# Patient Record
Sex: Female | Born: 1955 | Race: White | Hispanic: No | Marital: Married | Smoking: Never smoker
Health system: Southern US, Community
[De-identification: ages and names within clinical notes are randomized; demographics above are authoritative.]

## PROBLEM LIST (undated history)

## (undated) DIAGNOSIS — F329 Major depressive disorder, single episode, unspecified: Secondary | ICD-10-CM

## (undated) DIAGNOSIS — B001 Herpesviral vesicular dermatitis: Secondary | ICD-10-CM

## (undated) DIAGNOSIS — F32A Depression, unspecified: Secondary | ICD-10-CM

## (undated) DIAGNOSIS — J45909 Unspecified asthma, uncomplicated: Secondary | ICD-10-CM

## (undated) DIAGNOSIS — M1712 Unilateral primary osteoarthritis, left knee: Secondary | ICD-10-CM

## (undated) DIAGNOSIS — R112 Nausea with vomiting, unspecified: Secondary | ICD-10-CM

## (undated) DIAGNOSIS — Z8739 Personal history of other diseases of the musculoskeletal system and connective tissue: Secondary | ICD-10-CM

## (undated) DIAGNOSIS — G250 Essential tremor: Secondary | ICD-10-CM

## (undated) DIAGNOSIS — F411 Generalized anxiety disorder: Secondary | ICD-10-CM

## (undated) DIAGNOSIS — M797 Fibromyalgia: Secondary | ICD-10-CM

## (undated) DIAGNOSIS — E079 Disorder of thyroid, unspecified: Secondary | ICD-10-CM

## (undated) DIAGNOSIS — R42 Dizziness and giddiness: Secondary | ICD-10-CM

## (undated) DIAGNOSIS — Z9889 Other specified postprocedural states: Secondary | ICD-10-CM

## (undated) DIAGNOSIS — E785 Hyperlipidemia, unspecified: Secondary | ICD-10-CM

## (undated) DIAGNOSIS — F419 Anxiety disorder, unspecified: Secondary | ICD-10-CM

## (undated) DIAGNOSIS — R51 Headache: Secondary | ICD-10-CM

## (undated) DIAGNOSIS — Z803 Family history of malignant neoplasm of breast: Secondary | ICD-10-CM

## (undated) DIAGNOSIS — H9319 Tinnitus, unspecified ear: Secondary | ICD-10-CM

## (undated) DIAGNOSIS — N189 Chronic kidney disease, unspecified: Secondary | ICD-10-CM

## (undated) HISTORY — DX: Unilateral primary osteoarthritis, left knee: M17.12

## (undated) HISTORY — DX: Dizziness and giddiness: R42

## (undated) HISTORY — DX: Anxiety disorder, unspecified: F41.9

## (undated) HISTORY — DX: Fibromyalgia: M79.7

## (undated) HISTORY — PX: KNEE ARTHROSCOPY: SUR90

## (undated) HISTORY — DX: Family history of malignant neoplasm of breast: Z80.3

## (undated) HISTORY — DX: Essential tremor: G25.0

## (undated) HISTORY — DX: Personal history of other diseases of the musculoskeletal system and connective tissue: Z87.39

## (undated) HISTORY — DX: Disorder of thyroid, unspecified: E07.9

## (undated) HISTORY — DX: Depression, unspecified: F32.A

## (undated) HISTORY — DX: Chronic kidney disease, unspecified: N18.9

## (undated) HISTORY — DX: Herpesviral vesicular dermatitis: B00.1

## (undated) HISTORY — PX: CHOLECYSTECTOMY: SHX55

## (undated) HISTORY — DX: Hyperlipidemia, unspecified: E78.5

## (undated) HISTORY — PX: ABDOMINAL HYSTERECTOMY: SHX81

## (undated) HISTORY — DX: Generalized anxiety disorder: F41.1

## (undated) HISTORY — PX: OTHER SURGICAL HISTORY: SHX169

## (undated) HISTORY — DX: Headache: R51

## (undated) HISTORY — PX: APPENDECTOMY: SHX54

## (undated) HISTORY — DX: Major depressive disorder, single episode, unspecified: F32.9

---

## 1991-02-04 HISTORY — PX: BREAST SURGERY: SHX581

## 1997-11-06 ENCOUNTER — Emergency Department (HOSPITAL_COMMUNITY): Admission: EM | Admit: 1997-11-06 | Discharge: 1997-11-06 | Payer: Self-pay | Admitting: Emergency Medicine

## 1998-10-11 ENCOUNTER — Emergency Department (HOSPITAL_COMMUNITY): Admission: EM | Admit: 1998-10-11 | Discharge: 1998-10-11 | Payer: Self-pay | Admitting: Emergency Medicine

## 1998-10-11 ENCOUNTER — Encounter: Payer: Self-pay | Admitting: Emergency Medicine

## 2001-01-03 ENCOUNTER — Inpatient Hospital Stay (HOSPITAL_COMMUNITY): Admission: EM | Admit: 2001-01-03 | Discharge: 2001-01-06 | Payer: Self-pay | Admitting: Emergency Medicine

## 2001-01-04 ENCOUNTER — Encounter: Payer: Self-pay | Admitting: Gastroenterology

## 2001-01-20 ENCOUNTER — Ambulatory Visit (HOSPITAL_COMMUNITY): Admission: RE | Admit: 2001-01-20 | Discharge: 2001-01-20 | Payer: Self-pay | Admitting: Gastroenterology

## 2001-02-03 HISTORY — PX: SPLENIC ARTERY EMBOLIZATION: SHX2430

## 2001-02-04 ENCOUNTER — Inpatient Hospital Stay (HOSPITAL_COMMUNITY): Admission: RE | Admit: 2001-02-04 | Discharge: 2001-02-08 | Payer: Self-pay

## 2001-02-04 ENCOUNTER — Encounter (INDEPENDENT_AMBULATORY_CARE_PROVIDER_SITE_OTHER): Payer: Self-pay | Admitting: Specialist

## 2001-02-14 ENCOUNTER — Emergency Department (HOSPITAL_COMMUNITY): Admission: EM | Admit: 2001-02-14 | Discharge: 2001-02-14 | Payer: Self-pay | Admitting: Emergency Medicine

## 2001-02-14 ENCOUNTER — Encounter: Payer: Self-pay | Admitting: Emergency Medicine

## 2004-12-06 ENCOUNTER — Encounter: Admission: RE | Admit: 2004-12-06 | Discharge: 2004-12-06 | Payer: Self-pay

## 2005-01-10 ENCOUNTER — Ambulatory Visit: Payer: Self-pay | Admitting: Internal Medicine

## 2005-01-16 ENCOUNTER — Ambulatory Visit: Payer: Self-pay | Admitting: Internal Medicine

## 2005-02-11 ENCOUNTER — Ambulatory Visit: Payer: Self-pay | Admitting: Internal Medicine

## 2005-05-12 ENCOUNTER — Ambulatory Visit: Payer: Self-pay | Admitting: Internal Medicine

## 2005-09-16 ENCOUNTER — Ambulatory Visit: Payer: Self-pay | Admitting: Internal Medicine

## 2006-01-12 ENCOUNTER — Ambulatory Visit (HOSPITAL_COMMUNITY): Admission: RE | Admit: 2006-01-12 | Discharge: 2006-01-12 | Payer: Self-pay | Admitting: Internal Medicine

## 2006-01-12 ENCOUNTER — Ambulatory Visit: Payer: Self-pay | Admitting: Internal Medicine

## 2006-01-30 ENCOUNTER — Encounter: Payer: Self-pay | Admitting: Internal Medicine

## 2006-01-30 LAB — CONVERTED CEMR LAB

## 2006-03-18 ENCOUNTER — Ambulatory Visit: Payer: Self-pay | Admitting: Internal Medicine

## 2006-04-06 ENCOUNTER — Ambulatory Visit: Payer: Self-pay | Admitting: Internal Medicine

## 2006-04-17 ENCOUNTER — Ambulatory Visit: Payer: Self-pay | Admitting: Internal Medicine

## 2006-04-20 LAB — CONVERTED CEMR LAB
ALT: 12 units/L (ref 0–40)
AST: 19 units/L (ref 0–37)
BUN: 14 mg/dL (ref 6–23)
Basophils Absolute: 0 10*3/uL (ref 0.0–0.1)
Basophils Relative: 0.4 % (ref 0.0–1.0)
CO2: 25 meq/L (ref 19–32)
Calcium: 9.3 mg/dL (ref 8.4–10.5)
Chloride: 106 meq/L (ref 96–112)
Cholesterol: 256 mg/dL (ref 0–200)
Creatinine, Ser: 0.8 mg/dL (ref 0.4–1.2)
Direct LDL: 151.3 mg/dL
Eosinophils Absolute: 0.2 10*3/uL (ref 0.0–0.6)
Eosinophils Relative: 2.4 % (ref 0.0–5.0)
GFR calc Af Amer: 98 mL/min
GFR calc non Af Amer: 81 mL/min
Glucose, Bld: 100 mg/dL — ABNORMAL HIGH (ref 70–99)
HCT: 41 % (ref 36.0–46.0)
HDL: 52.3 mg/dL (ref >39.0)
Hemoglobin: 14.5 g/dL (ref 12.0–15.0)
Lymphocytes Relative: 30.3 % (ref 12.0–46.0)
MCHC: 35.4 g/dL (ref 30.0–36.0)
MCV: 88.6 fL (ref 78.0–100.0)
Monocytes Absolute: 0.4 10*3/uL (ref 0.2–0.7)
Monocytes Relative: 6.3 % (ref 3.0–11.0)
Neutro Abs: 3.9 10*3/uL (ref 1.4–7.7)
Neutrophils Relative %: 60.6 % (ref 43.0–77.0)
Platelets: 294 10*3/uL (ref 150–400)
Potassium: 4.4 meq/L (ref 3.5–5.1)
RBC: 4.63 M/uL (ref 3.87–5.11)
RDW: 12.2 % (ref 11.5–14.6)
Sodium: 139 meq/L (ref 135–145)
TSH: 0.73 microintl units/mL (ref 0.35–5.50)
Total CHOL/HDL Ratio: 4.9
Triglycerides: 350 mg/dL (ref 0–149)
VLDL: 70 mg/dL — ABNORMAL HIGH (ref 0–40)
WBC: 6.5 10*3/uL (ref 4.5–10.5)

## 2006-05-05 ENCOUNTER — Ambulatory Visit: Payer: Self-pay | Admitting: Internal Medicine

## 2006-09-01 ENCOUNTER — Encounter: Payer: Self-pay | Admitting: Internal Medicine

## 2006-09-01 DIAGNOSIS — F32A Depression, unspecified: Secondary | ICD-10-CM | POA: Insufficient documentation

## 2006-09-01 DIAGNOSIS — IMO0001 Reserved for inherently not codable concepts without codable children: Secondary | ICD-10-CM | POA: Insufficient documentation

## 2006-09-01 DIAGNOSIS — F329 Major depressive disorder, single episode, unspecified: Secondary | ICD-10-CM

## 2006-09-01 DIAGNOSIS — Z87898 Personal history of other specified conditions: Secondary | ICD-10-CM | POA: Insufficient documentation

## 2006-09-01 DIAGNOSIS — E039 Hypothyroidism, unspecified: Secondary | ICD-10-CM | POA: Insufficient documentation

## 2006-09-01 DIAGNOSIS — R51 Headache: Secondary | ICD-10-CM | POA: Insufficient documentation

## 2006-09-01 DIAGNOSIS — F411 Generalized anxiety disorder: Secondary | ICD-10-CM | POA: Insufficient documentation

## 2006-09-01 DIAGNOSIS — R519 Headache, unspecified: Secondary | ICD-10-CM | POA: Insufficient documentation

## 2006-12-02 ENCOUNTER — Ambulatory Visit: Payer: Self-pay | Admitting: Internal Medicine

## 2006-12-02 LAB — CONVERTED CEMR LAB
ALT: 23 units/L (ref 0–35)
AST: 24 units/L (ref 0–37)
Albumin: 3.7 g/dL (ref 3.5–5.2)
Alkaline Phosphatase: 82 units/L (ref 39–117)
Anti Nuclear Antibody(ANA): NEGATIVE
BUN: 12 mg/dL (ref 6–23)
Basophils Absolute: 0 10*3/uL (ref 0.0–0.1)
Basophils Relative: 0.7 % (ref 0.0–1.0)
Bilirubin, Direct: 0.1 mg/dL (ref 0.0–0.3)
CO2: 27 meq/L (ref 19–32)
Calcium: 9.4 mg/dL (ref 8.4–10.5)
Chloride: 106 meq/L (ref 96–112)
Cholesterol: 249 mg/dL (ref 0–200)
Creatinine, Ser: 0.9 mg/dL (ref 0.4–1.2)
Direct LDL: 159.5 mg/dL
Eosinophils Absolute: 0.2 10*3/uL (ref 0.0–0.6)
Eosinophils Relative: 2.6 % (ref 0.0–5.0)
GFR calc Af Amer: 86 mL/min
GFR calc non Af Amer: 71 mL/min
Glucose, Bld: 97 mg/dL (ref 70–99)
HCT: 37.1 % (ref 36.0–46.0)
HDL: 43.2 mg/dL (ref >39.0)
Hemoglobin: 12.9 g/dL (ref 12.0–15.0)
Lymphocytes Relative: 31.4 % (ref 12.0–46.0)
MCHC: 34.9 g/dL (ref 30.0–36.0)
MCV: 90.9 fL (ref 78.0–100.0)
Monocytes Absolute: 0.4 10*3/uL (ref 0.2–0.7)
Monocytes Relative: 6.5 % (ref 3.0–11.0)
Neutro Abs: 3.8 10*3/uL (ref 1.4–7.7)
Neutrophils Relative %: 58.8 % (ref 43.0–77.0)
Platelets: 325 10*3/uL (ref 150–400)
Potassium: 4.4 meq/L (ref 3.5–5.1)
RBC: 4.08 M/uL (ref 3.87–5.11)
RDW: 13 % (ref 11.5–14.6)
Rhuematoid fact SerPl-aCnc: <20 intl units/mL — ABNORMAL LOW (ref 0.0–20.0)
Sed Rate: 34 mm/hr — ABNORMAL HIGH (ref 0–25)
Sodium: 140 meq/L (ref 135–145)
Total Bilirubin: 0.4 mg/dL (ref 0.3–1.2)
Total CHOL/HDL Ratio: 5.8
Total Protein: 7.2 g/dL (ref 6.0–8.3)
Triglycerides: 285 mg/dL (ref 0–149)
VLDL: 57 mg/dL — ABNORMAL HIGH (ref 0–40)
WBC: 6.4 10*3/uL (ref 4.5–10.5)

## 2006-12-21 LAB — CONVERTED CEMR LAB: Pap Smear: NORMAL

## 2006-12-21 LAB — HM MAMMOGRAPHY: HM Mammogram: NORMAL

## 2006-12-22 ENCOUNTER — Encounter: Payer: Self-pay | Admitting: Internal Medicine

## 2007-01-12 ENCOUNTER — Encounter: Payer: Self-pay | Admitting: Internal Medicine

## 2007-01-19 ENCOUNTER — Ambulatory Visit: Payer: Self-pay | Admitting: Internal Medicine

## 2007-01-19 DIAGNOSIS — E782 Mixed hyperlipidemia: Secondary | ICD-10-CM | POA: Insufficient documentation

## 2007-01-19 DIAGNOSIS — Z87442 Personal history of urinary calculi: Secondary | ICD-10-CM | POA: Insufficient documentation

## 2007-01-19 DIAGNOSIS — G4700 Insomnia, unspecified: Secondary | ICD-10-CM | POA: Insufficient documentation

## 2007-02-17 ENCOUNTER — Encounter: Payer: Self-pay | Admitting: Internal Medicine

## 2007-03-03 ENCOUNTER — Ambulatory Visit: Payer: Self-pay | Admitting: Internal Medicine

## 2007-03-03 DIAGNOSIS — M109 Gout, unspecified: Secondary | ICD-10-CM | POA: Insufficient documentation

## 2007-03-03 LAB — CONVERTED CEMR LAB
ALT: 33 units/L (ref 0–35)
AST: 31 units/L (ref 0–37)
Cholesterol: 229 mg/dL (ref 0–200)
Direct LDL: 118.1 mg/dL
HDL: 56.5 mg/dL (ref >39.0)
Total CHOL/HDL Ratio: 4.1
Total CK: 58 units/L (ref 7–177)
Triglycerides: 353 mg/dL (ref 0–149)
VLDL: 71 mg/dL — ABNORMAL HIGH (ref 0–40)

## 2007-03-09 ENCOUNTER — Ambulatory Visit: Payer: Self-pay | Admitting: Internal Medicine

## 2007-03-15 ENCOUNTER — Telehealth: Payer: Self-pay | Admitting: Internal Medicine

## 2007-04-19 ENCOUNTER — Encounter: Payer: Self-pay | Admitting: Internal Medicine

## 2007-06-08 ENCOUNTER — Telehealth: Payer: Self-pay | Admitting: Internal Medicine

## 2007-07-05 ENCOUNTER — Encounter: Payer: Self-pay | Admitting: Internal Medicine

## 2007-12-13 ENCOUNTER — Ambulatory Visit: Payer: Self-pay | Admitting: Internal Medicine

## 2007-12-17 ENCOUNTER — Telehealth (INDEPENDENT_AMBULATORY_CARE_PROVIDER_SITE_OTHER): Payer: Self-pay | Admitting: *Deleted

## 2007-12-23 ENCOUNTER — Ambulatory Visit: Payer: Self-pay | Admitting: Internal Medicine

## 2007-12-27 ENCOUNTER — Telehealth: Payer: Self-pay | Admitting: Internal Medicine

## 2007-12-28 ENCOUNTER — Ambulatory Visit: Payer: Self-pay | Admitting: Internal Medicine

## 2007-12-29 DIAGNOSIS — J989 Respiratory disorder, unspecified: Secondary | ICD-10-CM | POA: Insufficient documentation

## 2007-12-29 DIAGNOSIS — R0989 Other specified symptoms and signs involving the circulatory and respiratory systems: Secondary | ICD-10-CM

## 2007-12-31 ENCOUNTER — Ambulatory Visit: Payer: Self-pay | Admitting: Internal Medicine

## 2008-01-26 ENCOUNTER — Ambulatory Visit (HOSPITAL_COMMUNITY): Admission: RE | Admit: 2008-01-26 | Discharge: 2008-01-26 | Payer: Self-pay | Admitting: Plastic Surgery

## 2008-03-15 ENCOUNTER — Ambulatory Visit: Payer: Self-pay | Admitting: Internal Medicine

## 2008-03-15 DIAGNOSIS — R5381 Other malaise: Secondary | ICD-10-CM | POA: Insufficient documentation

## 2008-03-15 DIAGNOSIS — R5383 Other fatigue: Secondary | ICD-10-CM

## 2008-03-17 LAB — CONVERTED CEMR LAB
ALT: 26 units/L (ref 0–35)
AST: 22 units/L (ref 0–37)
Albumin: 4.3 g/dL (ref 3.5–5.2)
Alkaline Phosphatase: 81 units/L (ref 39–117)
BUN: 16 mg/dL (ref 6–23)
Basophils Absolute: 0 10*3/uL (ref 0.0–0.1)
Basophils Relative: 0.1 % (ref 0.0–3.0)
Bilirubin, Direct: 0.1 mg/dL (ref 0.0–0.3)
CO2: 29 meq/L (ref 19–32)
Calcium: 9.7 mg/dL (ref 8.4–10.5)
Chloride: 109 meq/L (ref 96–112)
Cholesterol: 263 mg/dL (ref 0–200)
Creatinine, Ser: 0.8 mg/dL (ref 0.4–1.2)
Direct LDL: 180.3 mg/dL
Eosinophils Absolute: 0.2 10*3/uL (ref 0.0–0.7)
Eosinophils Relative: 2.6 % (ref 0.0–5.0)
Folate: 11.6 ng/mL
GFR calc Af Amer: 97 mL/min
GFR calc non Af Amer: 80 mL/min
Glucose, Bld: 96 mg/dL (ref 70–99)
HCT: 44.8 % (ref 36.0–46.0)
HDL: 62.2 mg/dL (ref >39.0)
Hemoglobin: 15.5 g/dL — ABNORMAL HIGH (ref 12.0–15.0)
Lymphocytes Relative: 28.4 % (ref 12.0–46.0)
MCHC: 34.7 g/dL (ref 30.0–36.0)
MCV: 92.1 fL (ref 78.0–100.0)
Monocytes Absolute: 0.4 10*3/uL (ref 0.1–1.0)
Monocytes Relative: 5.6 % (ref 3.0–12.0)
Neutro Abs: 4.2 10*3/uL (ref 1.4–7.7)
Neutrophils Relative %: 63.3 % (ref 43.0–77.0)
Platelets: 269 10*3/uL (ref 150–400)
Potassium: 4.1 meq/L (ref 3.5–5.1)
RBC: 4.86 M/uL (ref 3.87–5.11)
RDW: 12.9 % (ref 11.5–14.6)
Sed Rate: 12 mm/hr (ref 0–22)
Sodium: 140 meq/L (ref 135–145)
TSH: 2.02 microintl units/mL (ref 0.35–5.50)
Total Bilirubin: 0.9 mg/dL (ref 0.3–1.2)
Total CHOL/HDL Ratio: 4.2
Total Protein: 7.8 g/dL (ref 6.0–8.3)
Triglycerides: 276 mg/dL (ref 0–149)
VLDL: 55 mg/dL — ABNORMAL HIGH (ref 0–40)
Vitamin B-12: 335 pg/mL (ref 211–911)
WBC: 6.7 10*3/uL (ref 4.5–10.5)

## 2008-03-21 ENCOUNTER — Telehealth (INDEPENDENT_AMBULATORY_CARE_PROVIDER_SITE_OTHER): Payer: Self-pay | Admitting: *Deleted

## 2008-05-22 ENCOUNTER — Telehealth: Payer: Self-pay | Admitting: Internal Medicine

## 2008-06-23 ENCOUNTER — Telehealth: Payer: Self-pay | Admitting: Internal Medicine

## 2008-06-26 ENCOUNTER — Emergency Department (HOSPITAL_COMMUNITY): Admission: EM | Admit: 2008-06-26 | Discharge: 2008-06-26 | Payer: Self-pay | Admitting: Emergency Medicine

## 2008-06-27 ENCOUNTER — Ambulatory Visit: Payer: Self-pay | Admitting: Internal Medicine

## 2008-06-27 DIAGNOSIS — G4710 Hypersomnia, unspecified: Secondary | ICD-10-CM | POA: Insufficient documentation

## 2008-06-27 LAB — CONVERTED CEMR LAB
ALT: 19 units/L (ref 0–35)
AST: 23 units/L (ref 0–37)
Albumin: 4 g/dL (ref 3.5–5.2)
Alkaline Phosphatase: 72 units/L (ref 39–117)
Bilirubin, Direct: 0.1 mg/dL (ref 0.0–0.3)
Cholesterol, target level: 200 mg/dL
Cholesterol: 193 mg/dL (ref 0–200)
Direct LDL: 112.3 mg/dL
HDL goal, serum: 40 mg/dL
HDL: 65.5 mg/dL (ref >39.00)
LDL Goal: 160 mg/dL
TSH: 0.75 microintl units/mL (ref 0.35–5.50)
Total Bilirubin: 0.6 mg/dL (ref 0.3–1.2)
Total CHOL/HDL Ratio: 3
Total CK: 60 units/L (ref 7–177)
Total Protein: 7.6 g/dL (ref 6.0–8.3)
Triglycerides: 232 mg/dL — ABNORMAL HIGH (ref 0.0–149.0)
VLDL: 46.4 mg/dL — ABNORMAL HIGH (ref 0.0–40.0)

## 2008-06-28 ENCOUNTER — Encounter: Payer: Self-pay | Admitting: Internal Medicine

## 2008-06-29 ENCOUNTER — Telehealth (INDEPENDENT_AMBULATORY_CARE_PROVIDER_SITE_OTHER): Payer: Self-pay | Admitting: *Deleted

## 2008-07-26 ENCOUNTER — Telehealth (INDEPENDENT_AMBULATORY_CARE_PROVIDER_SITE_OTHER): Payer: Self-pay | Admitting: *Deleted

## 2008-08-04 ENCOUNTER — Ambulatory Visit: Payer: Self-pay | Admitting: Internal Medicine

## 2008-08-31 ENCOUNTER — Ambulatory Visit: Payer: Self-pay | Admitting: Gastroenterology

## 2008-09-15 ENCOUNTER — Ambulatory Visit: Payer: Self-pay | Admitting: Gastroenterology

## 2008-09-15 LAB — HM COLONOSCOPY

## 2008-09-20 ENCOUNTER — Telehealth: Payer: Self-pay | Admitting: Internal Medicine

## 2008-10-24 ENCOUNTER — Telehealth: Payer: Self-pay | Admitting: Internal Medicine

## 2008-11-21 ENCOUNTER — Telehealth: Payer: Self-pay | Admitting: Internal Medicine

## 2008-11-23 ENCOUNTER — Encounter: Payer: Self-pay | Admitting: Internal Medicine

## 2008-12-26 ENCOUNTER — Telehealth: Payer: Self-pay | Admitting: Internal Medicine

## 2009-02-22 ENCOUNTER — Telehealth: Payer: Self-pay | Admitting: Internal Medicine

## 2009-02-22 ENCOUNTER — Ambulatory Visit: Payer: Self-pay | Admitting: Internal Medicine

## 2009-03-09 ENCOUNTER — Ambulatory Visit: Payer: Self-pay | Admitting: Internal Medicine

## 2009-04-04 ENCOUNTER — Ambulatory Visit: Payer: Self-pay | Admitting: Internal Medicine

## 2009-04-04 LAB — CONVERTED CEMR LAB
ALT: 34 units/L (ref 0–35)
AST: 23 units/L (ref 0–37)
Albumin: 4 g/dL (ref 3.5–5.2)
Alkaline Phosphatase: 86 units/L (ref 39–117)
BUN: 5 mg/dL — ABNORMAL LOW (ref 6–23)
Basophils Absolute: 0.2 10*3/uL — ABNORMAL HIGH (ref 0.0–0.1)
Basophils Relative: 3.9 % — ABNORMAL HIGH (ref 0.0–3.0)
Bilirubin, Direct: 0.1 mg/dL (ref 0.0–0.3)
CO2: 30 meq/L (ref 19–32)
Calcium: 9 mg/dL (ref 8.4–10.5)
Chloride: 102 meq/L (ref 96–112)
Creatinine, Ser: 0.6 mg/dL (ref 0.4–1.2)
Eosinophils Absolute: 0.1 10*3/uL (ref 0.0–0.7)
Eosinophils Relative: 2.5 % (ref 0.0–5.0)
GFR calc non Af Amer: 111.5 mL/min (ref >60)
Glucose, Bld: 95 mg/dL (ref 70–99)
HCT: 44.2 % (ref 36.0–46.0)
Hemoglobin: 14.8 g/dL (ref 12.0–15.0)
Lymphocytes Relative: 37.2 % (ref 12.0–46.0)
Lymphs Abs: 2.2 10*3/uL (ref 0.7–4.0)
MCHC: 33.4 g/dL (ref 30.0–36.0)
MCV: 95.6 fL (ref 78.0–100.0)
Mono Screen: NEGATIVE
Monocytes Absolute: 0.4 10*3/uL (ref 0.1–1.0)
Monocytes Relative: 7.1 % (ref 3.0–12.0)
Neutro Abs: 2.9 10*3/uL (ref 1.4–7.7)
Neutrophils Relative %: 49.3 % (ref 43.0–77.0)
Platelets: 280 10*3/uL (ref 150.0–400.0)
Potassium: 3.7 meq/L (ref 3.5–5.1)
RBC: 4.63 M/uL (ref 3.87–5.11)
RDW: 11.9 % (ref 11.5–14.6)
Sed Rate: 12 mm/hr (ref 0–22)
Sodium: 140 meq/L (ref 135–145)
TSH: 1.06 microintl units/mL (ref 0.35–5.50)
Total Bilirubin: 0.2 mg/dL — ABNORMAL LOW (ref 0.3–1.2)
Total CK: 43 units/L (ref 7–177)
Total Protein: 7.6 g/dL (ref 6.0–8.3)
WBC: 5.8 10*3/uL (ref 4.5–10.5)

## 2009-04-06 ENCOUNTER — Encounter: Payer: Self-pay | Admitting: Internal Medicine

## 2009-04-26 ENCOUNTER — Telehealth: Payer: Self-pay | Admitting: Internal Medicine

## 2009-05-09 ENCOUNTER — Ambulatory Visit: Payer: Self-pay | Admitting: Internal Medicine

## 2009-05-23 ENCOUNTER — Encounter: Payer: Self-pay | Admitting: Internal Medicine

## 2009-05-23 LAB — HM PAP SMEAR: HM Pap smear: NORMAL

## 2009-06-07 ENCOUNTER — Telehealth: Payer: Self-pay | Admitting: Internal Medicine

## 2009-11-05 ENCOUNTER — Telehealth: Payer: Self-pay | Admitting: Internal Medicine

## 2009-11-07 ENCOUNTER — Telehealth (INDEPENDENT_AMBULATORY_CARE_PROVIDER_SITE_OTHER): Payer: Self-pay | Admitting: *Deleted

## 2009-11-08 ENCOUNTER — Encounter: Payer: Self-pay | Admitting: Internal Medicine

## 2009-11-08 ENCOUNTER — Telehealth (INDEPENDENT_AMBULATORY_CARE_PROVIDER_SITE_OTHER): Payer: Self-pay | Admitting: *Deleted

## 2009-12-20 ENCOUNTER — Ambulatory Visit: Payer: Self-pay | Admitting: Internal Medicine

## 2009-12-20 DIAGNOSIS — J019 Acute sinusitis, unspecified: Secondary | ICD-10-CM | POA: Insufficient documentation

## 2009-12-20 DIAGNOSIS — B351 Tinea unguium: Secondary | ICD-10-CM | POA: Insufficient documentation

## 2010-01-07 ENCOUNTER — Encounter
Admission: RE | Admit: 2010-01-07 | Discharge: 2010-01-07 | Payer: Self-pay | Source: Home / Self Care | Admitting: Psychiatry

## 2010-02-14 ENCOUNTER — Telehealth: Payer: Self-pay | Admitting: Internal Medicine

## 2010-03-01 ENCOUNTER — Encounter: Payer: Self-pay | Admitting: Internal Medicine

## 2010-03-05 NOTE — Progress Notes (Signed)
   Phone Note Call from Patient Call back at [facility] Phone [phone]   Summary of Call: Pt said that at last office visit she forgot to tell you about these episodes she has been having. She says that it happens when she is standing up not doing any specific. Her arms feel heavy & the muscles are weak. These last approx 1 to 2 minutes.  Initial call taken by: Lamar Sprinkles,  March 15, 2007 3:21 PM  Follow-up for Phone Call        Will discuss further at next ov.  Call if symptoms worsen. Follow-up by: D. Thomos Lemons DO,  March 15, 2007 6:46 PM  Additional Follow-up for Phone Call Additional follow up Details #1::        Next office visit is june,pt made apt for next monday to discuss Additional Follow-up by: Lamar Sprinkles,  March 15, 2007 7:47 PM

## 2010-03-05 NOTE — Progress Notes (Signed)
  Phone Note Refill Request Message from:  Fax from Pharmacy on April 26, 2009 1:36 PM  Refills Requested: Medication #1:  MAXALT 10 MG  TABS Take 1 tablet by mouth once a day as needed   Last Refilled: 03/29/2009 Initial call taken by: Rock Nephew CMA,  April 26, 2009 1:37 PM    Prescriptions: MAXALT 10 MG  TABS (RIZATRIPTAN BENZOATE) Take 1 tablet by mouth once a day as needed  #10 x 5   Entered by:   Rock Nephew CMA   Authorized by:   Etta Grandchild MD   Signed by:   Rock Nephew CMA on 04/26/2009   Method used:   Electronically to        [facility] (retail)       [address]       [place], [place]  [address]       Ph: [phone] or [phone]       Fax: [phone]   RxID:   [id number]

## 2010-03-05 NOTE — Progress Notes (Signed)
  Phone Note Call from Patient Call back at Home Phone [phone]   Caller: Patient Call For: Etta Grandchild MD Summary of Call: per pt call still having problems with her ears hurting and still have sinus problem , want to know if Dr Jonny Ruiz could presribe  more  antibotic for her Initial call taken by: Shelbie Proctor,  March 21, 2008 1:34 PM  Follow-up for Phone Call        ok to change to clarithromycin - done escrpt Follow-up by: Corwin Levins MD,  March 21, 2008 1:41 PM  Additional Follow-up for Phone Call Additional follow up Details #1::        called pt to inform pt made aware Additional Follow-up by: Shelbie Proctor,  March 21, 2008 1:48 PM    New/Updated Medications: CLARITHROMYCIN 500 MG TABS (CLARITHROMYCIN) 1po two times a day   Prescriptions: CLARITHROMYCIN 500 MG TABS (CLARITHROMYCIN) 1po two times a day  #20 x 0   Entered and Authorized by:   Corwin Levins MD   Signed by:   Corwin Levins MD on 03/21/2008   Method used:   Electronically to        [facility] (retail)       [address]       [place], [place]  [address]       Ph: [phone] or [phone]       Fax: [phone]   RxID:   [id number]

## 2010-03-05 NOTE — Miscellaneous (Signed)
Summary: Paroxetine  Medications Added PAXIL 20 MG  TABS (PAROXETINE HCL) Take 1 tablet by mouth once a day       Clinical Lists Changes  Medications: Changed medication from PAXIL 20 MG  TABS (PAROXETINE HCL) Take 1 tablet by mouth once a day to PAXIL 20 MG  TABS (PAROXETINE HCL) Take 1 tablet by mouth once a day - Signed Rx of PAXIL 20 MG  TABS (PAROXETINE HCL) Take 1 tablet by mouth once a day;  #30 x 6;  Signed;  Entered by: Glendell Docker;  Authorized by: D. Thomos Lemons DO;  Method used: Electronic    Prescriptions: PAXIL 20 MG  TABS (PAROXETINE HCL) Take 1 tablet by mouth once a day  #30 x 6   Entered by:   Glendell Docker   Authorized by:   D. Thomos Lemons DO   Signed by:   Glendell Docker on 07/05/2007   Method used:   Electronically sent to ...       [facility]       [address]       [place], [place]  [address]       Ph: [phone] or [phone]       Fax: [phone]   RxID:   [id number]

## 2010-03-05 NOTE — Progress Notes (Signed)
Summary: REFILL   Phone Note Refill Request   Refills Requested: Medication #1:  LUNESTA 2 MG  TABS Take 1 tablet by mouth once a day at bedtime as needed  Medication #2:  LYRICA 75 MG  CAPS Take 1 tablet by mouth three times a day  Medication #3:  DARVOCET-N 100 100-650 MG  TABS one by mouth q 12 prn Pt flying out of town tomorrow please fill today  Initial call taken by: Lamar Sprinkles,  Jun 08, 2007 1:59 PM  Follow-up for Phone Call        ok to refill meds x 5 Follow-up by: D. Thomos Lemons DO,  Jun 08, 2007 4:49 PM      Prescriptions: LUNESTA 2 MG  TABS (ESZOPICLONE) Take 1 tablet by mouth once a day at bedtime as needed  #30 x 5   Entered by:   Lamar Sprinkles   Authorized by:   D. Thomos Lemons DO   Signed by:   Lamar Sprinkles on 06/08/2007   Method used:   Telephoned to ...       [facility]       [address]       [place], [place]  [address]       Ph: [phone] or [phone]       Fax: [phone]   RxID:   [id number] LYRICA 75 MG  CAPS (PREGABALIN) Take 1 tablet by mouth three times a day  #90 x 5   Entered by:   Lamar Sprinkles   Authorized by:   D. Thomos Lemons DO   Signed by:   Lamar Sprinkles on 06/08/2007   Method used:   Telephoned to ...       [facility]       [address]       [place], [place]  [address]       Ph: [phone] or [phone]       Fax: [phone]   RxID:   [id number] DARVOCET-N 100 100-650 MG  TABS (PROPOXYPHENE N-APAP) one by mouth q 12 prn  #60 x 5   Entered by:   Lamar Sprinkles   Authorized by:   D. Thomos Lemons DO   Signed by:   Lamar Sprinkles on 06/08/2007   Method used:   Telephoned to ...       [facility]       [address]       [place], [place]  [address]       Ph: [phone] or [phone]       Fax: [phone]   RxID:   [id number]

## 2010-03-05 NOTE — Assessment & Plan Note (Signed)
Summary: refill/$50/cd   Vital Signs:  Patient profile:   55 year old female Height:      61 inches Weight:      117 pounds O2 Sat:      97 % on Room air Temp:     98.5 degrees F oral Pulse rate:   115 / minute Pulse rhythm:   regular BP sitting:   120 / 82  (left arm) Cuff size:   regular  Vitals Entered By: Rock Nephew CMA (August 04, 2008 8:00 AM)  O2 Flow:  Room air CC: refills, Depression Pain Assessment Patient in pain? no        Primary Care Provider:  Etta Grandchild MD  CC:  refills and Depression.  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 Her energy is much better, she is sleeping without a sleep aid, is exercising 3-4 times per week, and has a good mood. She attributes this to the new meds  (Nuvigil and Pristiq).   Depression History:      The patient comes in today for her second follow up visit for depression.  She notes that the symptoms started approximately 06/13/1998.  The patient denies a depressed mood most of the day and a diminished interest in her usual daily activities.  The patient denies significant weight loss, significant weight gain, insomnia, hypersomnia, psychomotor agitation, psychomotor retardation, fatigue (loss of energy), feelings of worthlessness (guilt), impaired concentration (indecisiveness), and recurrent thoughts of death or suicide.  The patient denies symptoms of a manic disorder including persistently & abnormally elevated mood, abnormally & persistently irritable mood, less need for sleep, distractibility, flight of ideas, increase in goal-directed activity, psychomotor agitation, inflated self-esteem or grandiosity, and excessive buying sprees.        Risk factors for depression include a personal history of depression and chronic illness.  The patient denies that she feels like life is not worth living, denies that she wishes that  she were dead, and denies that she has thought about ending her life.         Depression Treatment History:  Prior Medication Used:   Start Date: Assessment of Effect:   Comments:  Paxil (paroxetine)     05/26/2001   some improvement     --   Preventive Screening-Counseling & Management  Alcohol-Tobacco     Alcohol drinks/day: 0     Smoking Status: never  Clinical Review Panels:  Lipid Management   Cholesterol:  193 (06/27/2008)   LDL (bad choesterol):  DEL (03/15/2008)   HDL (good cholesterol):  65.50 (06/27/2008)  Diabetes Management   Creatinine:  0.8 (03/15/2008)   Last Flu Vaccine:  Fluvax 3+ (12/28/2007)   Last Pneumovax:  Pneumovax (12/28/2007)  CBC   WBC:  6.7 (03/15/2008)   RBC:  4.86 (03/15/2008)   Hgb:  15.5 (03/15/2008)   Hct:  44.8 (03/15/2008)   Platelets:  269 (03/15/2008)   MCV  92.1 (03/15/2008)   MCHC  34.7 (03/15/2008)   RDW  12.9 (03/15/2008)   PMN:  63.3 (03/15/2008)   Lymphs:  28.4 (03/15/2008)   Monos:  5.6 (03/15/2008)   Eosinophils:  2.6 (03/15/2008)   Basophil:  0.1 (03/15/2008)  Complete Metabolic Panel   Glucose:  96 (03/15/2008)   Sodium:  140 (03/15/2008)   Potassium:  4.1 (03/15/2008)   Chloride:  109 (03/15/2008)   CO2:  29 (03/15/2008)   BUN:  16 (03/15/2008)   Creatinine:  0.8 (03/15/2008)   Albumin:  4.0 (06/27/2008)   Total Protein:  7.6 (06/27/2008)   Calcium:  9.7 (03/15/2008)   Total Bili:  0.6 (06/27/2008)   Alk Phos:  72 (06/27/2008)   SGPT (ALT):  19 (06/27/2008)   SGOT (AST):  23 (06/27/2008)   Current Medications (verified): 1)  Synthroid 100 Mcg Tabs (Levothyroxine Sodium) .... One By Mouth Once Daily 2)  Pristiq 50 Mg Xr24h-Tab (Desvenlafaxine Succinate) .... One Q Day 3)  Premarin 0.625 Mg  Tabs (Estrogens Conjugated) .... Take 1 Tablet By Mouth Once A Day 4)  Maxalt 10 Mg  Tabs (Rizatriptan Benzoate) .... Take 1 Tablet By Mouth Once A Day As Needed 5)  Clonazepam 0.5 Mg Tabs (Clonazepam) .... 1/2 To One By  Mouth At Bedtime Prn 6)  Darvocet-N 100 100-650 Mg  Tabs (Propoxyphene N-Apap) .... One By Mouth Qid As Needed For Pain 7)  Simvastatin 80 Mg Tabs (Simvastatin) .Marland Kitchen.. 1 By Mouth Once Daily 8)  Valtrex 1 Gm Tabs (Valacyclovir Hcl) .... Take 1 Tablet By Mouth Once A Day 9)  Lunesta 2 Mg Tabs (Eszopiclone) .... Take 1 Tab By Mouth At Bedtime 10)  Nuvigil 150 Mg Tabs (Armodafinil) .... Once Daily  Allergies (verified): 1)  ! Pcn 2)  ! Codeine 3)  ! Lyrica  Past History:  Past Medical History: Reviewed history from 03/15/2008 and no changes required. Hypothyroidism Anxiety Depression Headache Nephrolithiasis, hx of Fibromyalgia  Asthma  Hyperlipidemia Gout  Past Surgical History: Reviewed history from 12/31/2007 and no changes required. Breast Reduction 1993 Hysterectomy Total knee replacement Appendectomy Cholecystectomy      Family History: Reviewed history from 01/19/2007 and no changes required. Mother has history of mini strokes and breast cancer. Father has history of coronary artery disease he is status post CABG   he also has history of hypertension, hyperlipidemia, peripheral vascular disease  Social History: Reviewed history from 12/31/2007 and no changes required. Occupation:  Freight forwarder Married with 4 children Never Smoked Alcohol use-no  Regular exercise-yes   Review of Systems  The patient denies anorexia, fever, weight loss, chest pain, abdominal pain, difficulty walking, and depression.    Physical Exam  General:  alert, well-developed, well-nourished, well-hydrated, appropriate dress, normal appearance, healthy-appearing, cooperative to examination, and good hygiene.   Neck:  supple, full ROM, no masses, normal carotid upstroke, no carotid bruits, no cervical lymphadenopathy, and no neck tenderness.   Lungs:  Normal respiratory effort, chest expands symmetrically. Lungs are clear to auscultation, no crackles or wheezes. Heart:   Normal rate and regular rhythm. S1 and S2 normal without gallop, murmur, click, rub or other extra sounds. Abdomen:  soft, non-tender, normal bowel sounds, no distention, no masses, no guarding, no rigidity, no rebound tenderness, no hepatomegaly, and no splenomegaly.   Psych:  Oriented X3, memory intact for recent and remote, normally interactive, good eye contact, not anxious appearing, not depressed appearing, and not agitated.     Impression & Recommendations:  Problem # 1:  DEPRESSION (ICD-311) Assessment Improved  Her updated medication list for this problem includes:    Pristiq 50 Mg Xr24h-tab (Desvenlafaxine succinate) ..... One q day    Clonazepam 0.5 Mg Tabs (Clonazepam) .Marland Kitchen... 1/2 to one by mouth at bedtime prn  Problem # 2:  HYPERSOMNIA, IDIOPATHIC (ICD-780.54) Assessment: Improved Continue Nuvigil  Problem # 3:  HYPOTHYROIDISM (ICD-244.9) Assessment: Improved  Her updated medication list for this problem includes:  Synthroid 100 Mcg Tabs (Levothyroxine sodium) ..... One by mouth once daily  Complete Medication List: 1)  Synthroid 100 Mcg Tabs (Levothyroxine sodium) .... One by mouth once daily 2)  Pristiq 50 Mg Xr24h-tab (Desvenlafaxine succinate) .... One q day 3)  Premarin 0.625 Mg Tabs (Estrogens conjugated) .... Take 1 tablet by mouth once a day 4)  Maxalt 10 Mg Tabs (Rizatriptan benzoate) .... Take 1 tablet by mouth once a day as needed 5)  Clonazepam 0.5 Mg Tabs (Clonazepam) .... 1/2 to one by mouth at bedtime prn 6)  Darvocet-n 100 100-650 Mg Tabs (Propoxyphene n-apap) .... One by mouth qid as needed for pain 7)  Simvastatin 80 Mg Tabs (Simvastatin) .Marland Kitchen.. 1 by mouth once daily 8)  Valtrex 1 Gm Tabs (Valacyclovir hcl) .... Take 1 tablet by mouth once a day 9)  Lunesta 2 Mg Tabs (Eszopiclone) .... Take 1 tab by mouth at bedtime 10)  Nuvigil 150 Mg Tabs (Armodafinil) .... Once daily  Patient Instructions: 1)  Please schedule a follow-up appointment in 4 months.  Prescriptions: NUVIGIL 150 MG TABS (ARMODAFINIL) once daily  #30 x 5   Entered and Authorized by:   Etta Grandchild MD   Signed by:   Etta Grandchild MD on 08/04/2008   Method used:   Print then Give to Patient   RxID:   [id number] PRISTIQ 50 MG XR24H-TAB (DESVENLAFAXINE SUCCINATE) one q day  #30 x 11   Entered and Authorized by:   Etta Grandchild MD   Signed by:   Etta Grandchild MD on 08/04/2008   Method used:   Print then Give to Patient   RxID:   [id number]

## 2010-03-05 NOTE — Progress Notes (Signed)
Summary: Valtrex Refill  Phone Note Refill Request Message from:  Fax from Pharmacy on May 22, 2008 5:13 PM  Refills Requested: Medication #1:  Valtrex 1gm   Dosage confirmed as above?Dosage Confirmed   Brand Name Necessary? No   Supply Requested: 1 month   Last Refilled: 08/25/2007   Notes: Original Rx written 12/02/06  Medication #2:  Alfonso Patten 2 mg   Dosage confirmed as above?Dosage Confirmed   Brand Name Necessary? No   Supply Requested: 1 month   Last Refilled: 10/25/2007   Notes: Original Rx written 06/08/07 Valtrex 1 gm Take 1 tablet by mouth once a day as directed Lunesta 2 mg Take 1 tab by mouth at bedtime as needed    Method Requested: Electronic Next Appointment Scheduled: NO FUTURE APPOINTMENTS ON FILE Initial call taken by: Glendell Docker CMA,  May 22, 2008 5:14 PM  Follow-up for Phone Call        sounds good to me Follow-up by: Etta Grandchild MD,  May 23, 2008 11:48 AM    New/Updated Medications: VALTREX 1 GM TABS (VALACYCLOVIR HCL) Take 1 tablet by mouth once a day LUNESTA 2 MG TABS (ESZOPICLONE) Take 1 tab by mouth at bedtime   Prescriptions: LUNESTA 2 MG TABS (ESZOPICLONE) Take 1 tab by mouth at bedtime  #30 x 2   Entered by:   Rock Nephew CMA   Authorized by:   Etta Grandchild MD   Signed by:   Rock Nephew CMA on 05/24/2008   Method used:   Telephoned to ...       [facility] (retail)       [address]       [place], [place]  [address]       Ph: [phone] or [phone]       Fax: [phone]   RxID:   [id number] VALTREX 1 GM TABS (VALACYCLOVIR HCL) Take 1 tablet by mouth once a day  #30 x 2   Entered by:   Rock Nephew CMA   Authorized by:   Etta Grandchild MD   Signed by:   Rock Nephew CMA on 05/24/2008   Method used:   Telephoned to ...       [facility] (retail)       [address]       [place], [place]  [address]       Ph: [phone] or  [phone]       Fax: [phone]   RxID:   [id number]

## 2010-03-05 NOTE — Letter (Signed)
Summary: [facility]  Endocrinology  & Diabetes  [facility]  Endocrinology  & Diabetes   Imported By: Sherian Rein 12/08/2008 14:55:16  _____________________________________________________________________  External Attachment:    Type:   Image     Comment:   External Document

## 2010-03-05 NOTE — Miscellaneous (Signed)
Summary: Lyrica  Medications Added PAXIL 20 MG  TABS (PAROXETINE HCL) Take 1 tablet by mouth once a day PREMARIN 0.625 MG  TABS (ESTROGENS CONJUGATED) Take 1 tablet by mouth once a day MAXALT 5 MG  TABS (RIZATRIPTAN BENZOATE) Take 1 tablet by mouth once a day as needed LUNESTA 2 MG  TABS (ESZOPICLONE) Take 1 tablet by mouth once a day at bedtime as needed LYRICA 25 MG  CAPS (PREGABALIN) Take 1 tablet by mouth three times a day       Clinical Lists Changes  Medications: Changed medication from LYRICA 25 MG  CAPS (PREGABALIN) as needed to LYRICA 25 MG  CAPS (PREGABALIN) Take 1 tablet by mouth three times a day - Signed Changed medication from MAXALT 5 MG  TABS (RIZATRIPTAN BENZOATE) to MAXALT 5 MG  TABS (RIZATRIPTAN BENZOATE) Take 1 tablet by mouth once a day as needed Changed medication from PAXIL 20 MG  TABS (PAROXETINE HCL) by mouth once daily to PAXIL 20 MG  TABS (PAROXETINE HCL) Take 1 tablet by mouth once a day Changed medication from PREMARIN 0.625 MG  TABS (ESTROGENS CONJUGATED) by mouth once daily to PREMARIN 0.625 MG  TABS (ESTROGENS CONJUGATED) Take 1 tablet by mouth once a day Changed medication from LUNESTA 2 MG  TABS (ESZOPICLONE) by mouth at bedtime as needed to LUNESTA 2 MG  TABS (ESZOPICLONE) Take 1 tablet by mouth once a day at bedtime as needed Rx of LYRICA 25 MG  CAPS (PREGABALIN) Take 1 tablet by mouth three times a day;  #90 x 5;  Signed;  Entered by: Glendell Docker;  Authorized by: Dondra Spry DO;  Method used: Telephoned to [facility] [address], [place], [place], [place]  [address], Ph: [phone] or [phone], Fax: [phone]    Prescriptions: LYRICA 25 MG  CAPS (PREGABALIN) Take 1 tablet by mouth three times a day  #90 x 5   Entered by:   Glendell Docker   Authorized by:   Dondra Spry DO   Signed by:   Glendell Docker on 01/12/2007   Method used:   Telephoned to ...       [facility]       [address]  [place], [place]  [address]       Ph: [phone] or [phone]       Fax: [phone]   RxID:   [id number]    Current Allergies: ! PCN ! CODEINE

## 2010-03-05 NOTE — Assessment & Plan Note (Signed)
Summary: f/u   Vital Signs:  Patient Profile:   55 Years Old Female Height:     61 inches Weight:      128.75 pounds Temp:     98.4 degrees F oral Pulse rate:   68 / minute Pulse rhythm:   regular Resp:     12 per minute BP sitting:   108 / 56  (right arm) Cuff size:   regular  Vitals Entered By: Windell Norfolk (December 13, 2007 11:11 AM)                 PCP:  Dondra Spry DO   History of Present Illness:  URI Symptoms      This is a 55 year old woman who presents with URI symptoms.  The patient reports nasal congestion, sore throat, and productive cough.  The patient denies fever.  The patient also reports headache and muscle aches.    She discontinued pravastatin.  She thought her cholesterol levels would be better if she lost weight.  She has stopped drinking soft drinks.    Current Allergies (reviewed today): ! PCN ! CODEINE  Past Medical History:    Hypothyroidism    Anxiety    Depression    Headache    Nephrolithiasis, hx of    Fibromyalgia       Past Surgical History:    Breast Reduction 1993    Hysterectomy    Total knee replacement    Appendectomy    Cholecystectomy           Physical Exam  General:     alert, well-developed, and well-nourished.   Head:     normocephalic and atraumatic.   Ears:     L ear normal and R TM retraction.   Mouth:     pharyngeal erythema and postnasal drip.   Neck:     supple and no masses.   Lungs:     normal respiratory effort and normal breath sounds.   Heart:     normal rate, no murmur, and no gallop.   Abdomen:     soft and non-tender.   Extremities:     No lower extremity edema  Neurologic:     cranial nerves II-XII intact and gait normal.   Psych:     normally interactive, good eye contact, not anxious appearing, and not depressed appearing.      Impression & Recommendations:  Problem # 1:  RHINOSINUSITIS, ACUTE (ICD-461.8)  Her updated medication list for this problem includes:  Cefuroxime Axetil 500 Mg Tabs (Cefuroxime axetil) ..... One by mouth bid    Tussionex Pennkinetic Er 8-10 Mg/35ml Lqcr (Chlorpheniramine-hydrocodone) .Marland KitchenMarland KitchenMarland KitchenMarland Kitchen 5 ml q 12 hrs as needed Instructed on treatment. Call if symptoms persist or worsen.   Problem # 2:  INSOMNIA, CHRONIC (ICD-307.42) Lunesta caused daytime somnolence.   She is using clonazepam for sleep.   Problem # 3:  FIBROMYALGIA (ICD-729.1) Lyrica discontinued due to fluid retention.   Trial of Savella.  Sample titration pack provided.  Her updated medication list for this problem includes:    Darvocet-n 100 100-650 Mg Tabs (Propoxyphene n-apap) ..... One by mouth q 12 prn   Problem # 4:  DYSLIPIDEMIA (ICD-272.4) Pt reports slight increase in fibromyalgia symptoms when she was taking pravachol.   Reduce dose to 20 mg.  I advised pt start fish oil supplement for hypertriglyceridemia.   The following medications were removed from the medication list:    Pravachol 40 Mg Tabs (  Pravastatin sodium) ..... One by mouth qhs  Her updated medication list for this problem includes:    Pravastatin Sodium 20 Mg Tabs (Pravastatin sodium) ..... One by mouth qpm   Complete Medication List: 1)  Synthroid 100 Mcg Tabs (Levothyroxine sodium) .... One by mouth once daily 2)  Paxil 20 Mg Tabs (Paroxetine hcl) .... Take 1 tablet by mouth once a day 3)  Premarin 0.625 Mg Tabs (Estrogens conjugated) .... Take 1 tablet by mouth once a day 4)  Maxalt 10 Mg Tabs (Rizatriptan benzoate) .... Take 1 tablet by mouth once a day as needed 5)  Clonazepam 0.5 Mg Tabs (Clonazepam) .... 1/2 to one by mouth at bedtime prn 6)  Darvocet-n 100 100-650 Mg Tabs (Propoxyphene n-apap) .... One by mouth q 12 prn 7)  Pravastatin Sodium 20 Mg Tabs (Pravastatin sodium) .... One by mouth qpm 8)  Cefuroxime Axetil 500 Mg Tabs (Cefuroxime axetil) .... One by mouth bid 9)  Tussionex Pennkinetic Er 8-10 Mg/51ml Lqcr (Chlorpheniramine-hydrocodone) .... 5 ml q 12 hrs as needed 10)   Savella 50 Mg Tabs (Milnacipran hcl) .... One by mouth two times a day   Patient Instructions: 1)  Please schedule a follow-up appointment in 2 months. 2)  BMP prior to visit, ICD-9:  272.4 3)  AST, ALT  prior to visit, ICD-9: 272.4 4)  Lipid Panel prior to visit, ICD-9: 272.4 5)  TSH prior to visit, ICD-9: 272.4 6)  Please return for lab work one (1) week before your next appointment.  7)  Take [employer] coated caps (2 caps two times a day )   Prescriptions: SYNTHROID 100 MCG TABS (LEVOTHYROXINE SODIUM) one by mouth once daily  #30 x 5   Entered and Authorized by:   D. Thomos Lemons DO   Signed by:   D. Thomos Lemons DO on 12/13/2007   Method used:   Electronically to        [facility] (retail)       [address]       [place], [place]  [address]       Ph: [phone] or [phone]       Fax: [phone]   RxID:   [id number] MAXALT 10 MG  TABS (RIZATRIPTAN BENZOATE) Take 1 tablet by mouth once a day as needed  #10 x 5   Entered and Authorized by:   D. Thomos Lemons DO   Signed by:   D. Thomos Lemons DO on 12/13/2007   Method used:   Electronically to        [facility]* (retail)       [address]       [place], [place]  [address]       Ph: [phone] or [phone]       Fax: [phone]   RxID:   [id number] PREMARIN 0.625 MG  TABS (ESTROGENS CONJUGATED) Take 1 tablet by mouth once a day  #30 x 5   Entered and Authorized by:   D. Thomos Lemons DO   Signed by:   D. Thomos Lemons DO on 12/13/2007   Method used:   Electronically to        [facility] (retail)       [address]       [place], [place]  [address]  Ph: [phone] or [phone]       Fax: [phone]   RxID:   [id number] PAXIL 20 MG  TABS (PAROXETINE HCL) Take 1 tablet by mouth once a day  #30 x 5   Entered and Authorized by:   D. Thomos Lemons DO   Signed by:   D. Thomos Lemons DO  on 12/13/2007   Method used:   Electronically to        [facility] (retail)       [address]       [place], [place]  [address]       Ph: [phone] or [phone]       Fax: [phone]   RxID:   [id number] DARVOCET-N 100 100-650 MG  TABS (PROPOXYPHENE N-APAP) one by mouth q 12 prn  #60 x 5   Entered and Authorized by:   D. Thomos Lemons DO   Signed by:   D. Thomos Lemons DO on 12/13/2007   Method used:   Print then Give to Patient   RxID:   [id number] CLONAZEPAM 0.5 MG TABS (CLONAZEPAM) 1/2 to one by mouth at bedtime prn  #30 x 5   Entered and Authorized by:   D. Thomos Lemons DO   Signed by:   D. Thomos Lemons DO on 12/13/2007   Method used:   Print then Give to Patient   RxID:   [id number] SAVELLA 50 MG TABS (MILNACIPRAN HCL) one by mouth two times a day  #60 x 1   Entered and Authorized by:   D. Thomos Lemons DO   Signed by:   D. Thomos Lemons DO on 12/13/2007   Method used:   Electronically to        [facility] (retail)       [address]       [place], [place]  [address]       Ph: [phone] or [phone]       Fax: [phone]   RxID:   [id number] Sandria Senter ER 8-10 MG/5ML LQCR (CHLORPHENIRAMINE-HYDROCODONE) 5 ml q 12 hrs as needed  #60 ml x 0   Entered and Authorized by:   D. Thomos Lemons DO   Signed by:   D. Thomos Lemons DO on 12/13/2007   Method used:   Print then Give to Patient   RxID:   [id number] CEFUROXIME AXETIL 500 MG TABS (CEFUROXIME AXETIL) one by mouth bid  #14 x 0   Entered and Authorized by:   D. Thomos Lemons DO   Signed by:   D. Thomos Lemons DO on 12/13/2007   Method used:   Electronically to        [facility] (retail)       [address]       [place], [place]  [address]       Ph: [phone] or [phone]       Fax: [phone]   RxID:   [id number] PRAVASTATIN SODIUM 20 MG TABS (PRAVASTATIN SODIUM) one by mouth qpm   #30 x 3   Entered and Authorized by:   D. Thomos Lemons DO   Signed by:   D. Thomos Lemons DO on 12/13/2007   Method used:   Electronically to        [facility] [phone]* (retail)  [address]       [place], [place]  [address]       Ph: [phone] or [phone]       Fax: [phone]   RxID:   [id number]  ]

## 2010-03-05 NOTE — Progress Notes (Signed)
Summary: Nuvigil refill request  Phone Note Refill Request   Refills Requested: Medication #1:  NUVIGIL 150 MG TABS once daily   Dosage confirmed as above?Dosage Confirmed Initial call taken by: Lucious Groves,  February 22, 2009 1:18 PM

## 2010-03-05 NOTE — Letter (Signed)
Summary: Results Follow-up Letter  [facility]  [address] [place], [place] [address]   Phone: [phone]  Fax: [phone]    06/28/2008  [address] [place], [place]  [address]  Dear Ms. FENSTER,   The following are the results of your recent test(s):  Test     Result     Liver       normal Thyroid     normal Muscle enzyme   normal   _________________________________________________________  Please call for an appointment as directed _________________________________________________________ _________________________________________________________ _________________________________________________________  Sincerely,  Sanda Linger MD [facility]

## 2010-03-05 NOTE — Progress Notes (Signed)
Summary: refill  Phone Note Refill Request Message from:  Fax from Pharmacy on November 05, 2009 3:03 PM  Refills Requested: Medication #1:  TRAMADOL HCL 50 MG TABS 1-2 by mouth QID as needed for pain   Dosage confirmed as above?Dosage Confirmed   Last Refilled: 10/08/2009 Initial call taken by: Rock Nephew CMA,  November 05, 2009 3:03 PM    Prescriptions: TRAMADOL HCL 50 MG TABS (TRAMADOL HCL) 1-2 by mouth QID as needed for pain  #120 x 6   Entered by:   Rock Nephew CMA   Authorized by:   Etta Grandchild MD   Signed by:   Rock Nephew CMA on 11/05/2009   Method used:   Electronically to        [facility] (retail)       [address]       [place], [place]  [address]       Ph: [phone]       Fax: [phone]   RxID:   [id number]   Appended Document: refill     Prescriptions: CLONAZEPAM 0.5 MG TABS (CLONAZEPAM) one by mouth at bedtime prn  #30 x 4   Entered by:   Rock Nephew CMA   Authorized by:   Etta Grandchild MD   Signed by:   Rock Nephew CMA on 11/05/2009   Method used:   Telephoned to ...       [facility] (retail)       [address]       [place], [place]  [address]       Ph: [phone]       Fax: [phone]   RxID:   [id number] MAXALT 10 MG  TABS (RIZATRIPTAN BENZOATE) Take 1 tablet by mouth once a day as needed  #10 x 5   Entered by:   Rock Nephew CMA   Authorized by:   Etta Grandchild MD   Signed by:   Rock Nephew CMA on 11/05/2009   Method used:   Electronically to        [facility]* (retail)       [address]       [place], [place]  [address]       Ph: [phone]       Fax: [phone]   RxID:   [id number]

## 2010-03-05 NOTE — Progress Notes (Signed)
Summary: PA-Maxalt  Phone Note From Pharmacy   Summary of Call: PA- Maxalt, called [facility] @ [phone], pa not required. Initial call taken by: Dagoberto Reef,  November 08, 2009 4:31 PM

## 2010-03-05 NOTE — Miscellaneous (Signed)
Summary: [facility] Previsit/prep  Clinical Lists Changes  Medications: Added new medication of DULCOLAX 5 MG  TBEC (BISACODYL) Day before procedure take 2 at 3pm and 2 at 8pm. - Signed Added new medication of METOCLOPRAMIDE HCL 10 MG  TABS (METOCLOPRAMIDE HCL) As per prep instructions. - Signed Added new medication of MIRALAX   POWD (POLYETHYLENE GLYCOL 3350) As per prep  instructions. - Signed Rx of DULCOLAX 5 MG  TBEC (BISACODYL) Day before procedure take 2 at 3pm and 2 at 8pm.;  #4 x 0;  Signed;  Entered by: Wyona Almas RN;  Authorized by: Louis Meckel MD;  Method used: Electronically to [facility] [phone]*, [address] [place], [place], [place]  [address], Ph: [phone] or [phone], Fax: [phone] Rx of METOCLOPRAMIDE HCL 10 MG  TABS (METOCLOPRAMIDE HCL) As per prep instructions.;  #2 x 0;  Signed;  Entered by: Wyona Almas RN;  Authorized by: Louis Meckel MD;  Method used: Electronically to [facility] [phone]*, [address] [place], [place], [place]  [address], Ph: [phone] or [phone], Fax: [phone] Rx of MIRALAX   POWD (POLYETHYLENE GLYCOL 3350) As per prep  instructions.;  #255gm x 0;  Signed;  Entered by: Wyona Almas RN;  Authorized by: Louis Meckel MD;  Method used: Electronically to [facility] [phone]*, [address] [place], [place], [place]  [address], Ph: [phone] or [phone], Fax: [phone] Observations: Added new observation of ALLERGY REV: Done (08/31/2008 13:28)    Prescriptions: MIRALAX   POWD (POLYETHYLENE GLYCOL 3350) As per prep  instructions.  #255gm x 0   Entered by:   Wyona Almas RN   Authorized by:   Louis Meckel MD   Signed by:   Wyona Almas RN on 08/31/2008   Method used:   Electronically to        [facility] (retail)       [address]       [place], [place]  [address]       Ph: [phone] or [phone]       Fax: [phone]   RxID:    [id number] METOCLOPRAMIDE HCL 10 MG  TABS (METOCLOPRAMIDE HCL) As per prep instructions.  #2 x 0   Entered by:   Wyona Almas RN   Authorized by:   Louis Meckel MD   Signed by:   Wyona Almas RN on 08/31/2008   Method used:   Electronically to        [facility]* (retail)       [address]       [place], [place]  [address]       Ph: [phone] or [phone]       Fax: [phone]   RxID:   [id number] DULCOLAX 5 MG  TBEC (BISACODYL) Day before procedure take 2 at 3pm and 2 at 8pm.  #4 x 0   Entered by:   Wyona Almas RN   Authorized by:   Louis Meckel MD   Signed by:   Wyona Almas RN on 08/31/2008   Method used:   Electronically to        [facility] [phone]* (retail)       [address]       [place], [place]  [address]  Ph: [phone] or [phone]       Fax: [phone]   RxID:   [id number]

## 2010-03-05 NOTE — Progress Notes (Signed)
Summary: refill req  Phone Note Refill Request Message from:  Fax from Pharmacy on September 20, 2008 3:25 PM  Refills Requested: Medication #1:  NUVIGIL 150 MG TABS once daily Initial call taken by: Josph Macho, MA,  September 20, 2008 3:25 PM  Follow-up for Phone Call        refill denied/ pharmacy notified by fax Follow-up by: Loree Fee,  September 20, 2008 3:26 PM

## 2010-03-05 NOTE — Assessment & Plan Note (Signed)
Summary: FU-STC  Medications Added MAXALT 10 MG  TABS (RIZATRIPTAN BENZOATE) Take 1 tablet by mouth once a day as needed LYRICA 75 MG  CAPS (PREGABALIN) Take 1 tablet by mouth three times a day KLONOPIN 0.5 MG  TABS (CLONAZEPAM) 1/2 to one by mouth at bedtime prn DARVOCET-N 100 100-650 MG  TABS (PROPOXYPHENE N-APAP) one by mouth q 12 prn CRESTOR 10 MG  TABS (ROSUVASTATIN CALCIUM) one by mouth qhs EQ OMEPRAZOLE 20 MG  TBEC (OMEPRAZOLE) one p.o. daily a.c.        Vital Signs:  Patient Profile:   55 Years Old Female Height:     61 inches Weight:      150.50 pounds BMI:     28.54 Temp:     98.5 degrees F oral Pulse rate:   70 / minute BP sitting:   106 / 80  (right arm)  Vitals Entered By: Glendell Docker (January 19, 2007 2:31 PM)                 Chief Complaint:  Discuss labs and Rheumatolgy appt.  History of Present Illness: 55 year old white female for follow-up regarding fibromyalgia.  She was previously referred to rheumatologist to rule outinflammatory process.  She was seen by Dr. Phylliss Bob who agreed with fibromyalgia diagnosis.  He noted her sleep quality was poor and recommended adding Klonopin to [place] at that time.  she reports proximally 6 hours of sleep per night.  Despite taking 2 mg of Lunesta, she awakens at 4 to 5 in the morning. Patient currently taking Lyrica 75 mg in the morning and one to two tabs in the afternoon.  She has tried higher doses of Lyrica but reports dizziness and fatigue.  We also reviewed recent lipid panel.  Patient has failed dietary/last changes.  She has family history of coronary artery disease.  Current Allergies (reviewed today): ! PCN ! CODEINE  Past Medical History:    Hypothyroidism    Anxiety    Depression    Headache    Nephrolithiasis, hx of   Family History:    Mother has history of mini strokes and breast cancer.    Father has history of coronary artery disease he is status post CABG      he also has history of  hypertension, hyperlipidemia, peripheral vascular disease  Social History:    Occupation:  Freight forwarder    Married with 4 children    Never Smoked    Alcohol use-no   Risk Factors:  Tobacco use:  never Alcohol use:  no   Review of Systems      See HPI   Physical Exam  General:     alert, well-developed, and well-nourished.   Neck:     No deformities, masses, or tenderness noted. Lungs:     Normal respiratory effort, chest expands symmetrically. Lungs are clear to auscultation, no crackles or wheezes. Heart:     Normal rate and regular rhythm. S1 and S2 normal without gallop, murmur, click, rub or other extra sounds. Extremities:     No lower extremity edema  Psych:     normally interactive and slightly anxious.      Impression & Recommendations:  Problem # 1:  FIBROMYALGIA (ICD-729.1) We reviewed consultation with rheumatologist.  He recommends a small dose of Klonopin would Lunesta to improve overall sleep quality. She reports exacerbation of musculoskeletal symptoms within the last one to two weeks.  I discouraged patient from using Advil on a daily  basis.   Continue omeprazole for gastric protection.  Use Darvocet short-term for recent exacerbation. She understands the importance of regular exercise. Continue Lyrica at 75 mg in the morning and 150 mg in the afternoon.  Higher doses of Lyrica cause dizziness and fatigue.  Her updated medication list for this problem includes:    Darvocet-n 100 100-650 Mg Tabs (Propoxyphene n-apap) ..... One by mouth q 12 prn   Problem # 2:  DYSLIPIDEMIA (ICD-272.4) Patient has failed dietary/lifestyle changes.  We discussed risks and benefits of statin therapy. She was advised to start OTC vit D3 supplements. Her updated medication list for this problem includes:    Crestor 10 Mg Tabs (Rosuvastatin calcium) ..... One by mouth qhs  Labs Reviewed: Chol: 249 (12/02/2006)   HDL: 43.2 (12/02/2006)   LDL: DEL  (12/02/2006)   TG: 285 (12/02/2006) SGOT: 24 (12/02/2006)   SGPT: 23 (12/02/2006)   Problem # 3:  DEPRESSION (ICD-311) Stable. Maintain current medication regimen.  Her updated medication list for this problem includes:    Paxil 20 Mg Tabs (Paroxetine hcl) .Marland Kitchen... Take 1 tablet by mouth once a day    Klonopin 0.5 Mg Tabs (Clonazepam) .Marland Kitchen... 1/2 to one by mouth at bedtime prn   Complete Medication List: 1)  Synthroid (levothyroxine Sodium)  .... .01mg  by mouth once daily 2)  Paxil 20 Mg Tabs (Paroxetine hcl) .... Take 1 tablet by mouth once a day 3)  Premarin 0.625 Mg Tabs (Estrogens conjugated) .... Take 1 tablet by mouth once a day 4)  Maxalt 10 Mg Tabs (Rizatriptan benzoate) .... Take 1 tablet by mouth once a day as needed 5)  Lunesta 2 Mg Tabs (Eszopiclone) .... Take 1 tablet by mouth once a day at bedtime as needed 6)  Lyrica 75 Mg Caps (Pregabalin) .... Take 1 tablet by mouth three times a day 7)  Klonopin 0.5 Mg Tabs (Clonazepam) .... 1/2 to one by mouth at bedtime prn 8)  Darvocet-n 100 100-650 Mg Tabs (Propoxyphene n-apap) .... One by mouth q 12 prn 9)  Crestor 10 Mg Tabs (Rosuvastatin calcium) .... One by mouth qhs 10)  Eq Omeprazole 20 Mg Tbec (Omeprazole) .... One p.o. daily a.c.   Patient Instructions: 1)  Please schedule a follow-up appointment in 6 weeks. 2)  Fasting: 3)  Lipid Panel prior to visit, ICD-9:  272.4 4)  AST, ALT, CPK :  274 5)  Please return for lab work one (1) week before your next appointment.     Prescriptions: EQ OMEPRAZOLE 20 MG  TBEC (OMEPRAZOLE) one p.o. daily a.c.  #30 x 5   Entered and Authorized by:   Dondra Spry DO   Signed by:   Dondra Spry DO on 01/19/2007   Method used:   Historical   RxID:   [id number] CRESTOR 10 MG  TABS (ROSUVASTATIN CALCIUM) one by mouth qhs  #30 x 0   Entered and Authorized by:   Dondra Spry DO   Signed by:   Dondra Spry DO on 01/19/2007   Method used:   Electronically sent to ...       [facility]       [address]       [place], [place]  [address]       Ph: [phone] or [phone]       Fax: [phone]   RxID:   [id number] DARVOCET-N 100 100-650 MG  TABS (  PROPOXYPHENE N-APAP) one by mouth q 12 prn  #60 x 0   Entered and Authorized by:   Dondra Spry DO   Signed by:   Dondra Spry DO on 01/19/2007   Method used:   Print then Give to Patient   RxID:   [id number] KLONOPIN 0.5 MG  TABS (CLONAZEPAM) 1/2 to one by mouth at bedtime prn  #30 x 2   Entered and Authorized by:   Dondra Spry DO   Signed by:   Dondra Spry DO on 01/19/2007   Method used:   Print then Give to Patient   RxID:   [id number]  ]

## 2010-03-05 NOTE — Assessment & Plan Note (Signed)
Summary: SINUS INFECTION/DK   Vital Signs:  Patient Profile:   55 Years Old Female Height:     61 inches Weight:      130 pounds BMI:     24.65 Temp:     98.1 degrees F oral Pulse rate:   74 / minute Pulse rhythm:   regular Resp:     18 per minute BP sitting:   100 / 70  (left arm) Cuff size:   regular  Vitals Entered By: Glendell Docker CMA (December 31, 2007 8:48 AM)                 PCP:  Dondra Spry DO  Chief Complaint:  Sinus drainage.  History of Present Illness: 55 y/o complains of sinus congestion, drainage yellow -green in color.  Cough improved with dose pack, denies sob or wheezing.    Current Allergies (reviewed today): ! PCN ! CODEINE ! 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   Head:     normocephalic and atraumatic.   Ears:     R ear normal and L ear normal.   Mouth:     Oral mucosa and oropharynx without lesions or exudates.  Teeth in good repair. Neck:     supple and no masses.   Lungs:     normal respiratory effort, normal breath sounds, and no wheezes.   Heart:     normal rate, regular rhythm, and no gallop.      Impression & Recommendations:  Problem # 1:  SINUSITIS (ICD-473.9) Pt with recent URI.  She has developed right sinus pressure and purulent drainage.   She likely has right maxillary sinusitis.  Doxy x 10 days.  Patient advised to call office if symptoms persist or worsen.  The following medications were removed from the medication list:    Tussionex  Pennkinetic Er 8-10 Mg/75ml Lqcr (Chlorpheniramine-hydrocodone) .Marland KitchenMarland KitchenMarland KitchenMarland Kitchen 5 ml q 12 hrs as needed  Her updated medication list for this problem includes:    Doxycycline Hyclate 100 Mg Caps (Doxycycline hyclate) ..... One by mouth bid   Complete Medication List: 1)  Synthroid 100 Mcg Tabs (Levothyroxine sodium) .... One by mouth once daily 2)  Paxil 20 Mg Tabs (Paroxetine hcl) .... Take 1 tablet by mouth once a day 3)  Premarin 0.625 Mg Tabs (Estrogens conjugated) .... Take 1 tablet by mouth once a day 4)  Maxalt 10 Mg Tabs (Rizatriptan benzoate) .... Take 1 tablet by mouth once a day as needed 5)  Clonazepam 0.5 Mg Tabs (Clonazepam) .... 1/2 to one by mouth at bedtime prn 6)  Darvocet-n 100 100-650 Mg Tabs (Propoxyphene n-apap) .... One by mouth q 12 prn 7)  Pravastatin Sodium 20 Mg Tabs (Pravastatin sodium) .... One by mouth qpm 8)  Savella 50 Mg Tabs (Milnacipran hcl) .... One  by mouth two times a day 9)  Fluconazole 150 Mg Tabs (Fluconazole) .... Take 1 tablet by mouth once a day x 3 days as directed 10)  Prednisone 10 Mg Tabs (Prednisone) .... 3 tabs by mouth once daily x 3 days, 2 tabs by mouth once daily x 3 days, 1 tab by mouth once daily x 3 days 11)  Doxycycline Hyclate 100 Mg Caps (Doxycycline hyclate) .... One by mouth bid    Prescriptions: DOXYCYCLINE HYCLATE 100 MG CAPS (DOXYCYCLINE HYCLATE) one by mouth bid  #20 x 0   Entered and Authorized by:   D. Thomos Lemons DO   Signed by:   D. Thomos Lemons DO on 12/31/2007   Method used:   Electronically to        [facility] (retail)       [address]       [place], [place]  [address]       Ph: [phone] or [phone]       Fax: [phone]   RxID:   [id number]  ] Current Allergies (reviewed today): ! PCN ! CODEINE ! LYRICA

## 2010-03-05 NOTE — Letter (Signed)
Summary: Veverly Fells Altheimer MD  Veverly Fells Altheimer MD   Imported By: Lester [place] 05/30/2009 10:03:04  _____________________________________________________________________  External Attachment:    Type:   Image     Comment:   External Document

## 2010-03-05 NOTE — Miscellaneous (Signed)
Summary: Orders Update  Clinical Lists Changes  Orders: Added new Service order of No Show NS50 (NS50) - Signed 

## 2010-03-05 NOTE — Assessment & Plan Note (Signed)
Summary: f/u bronchitis/#/cd   Vital Signs:  Patient profile:   55 year old female Height:      61 inches Weight:      125 pounds BMI:     23.70 O2 Sat:      98 % on Room air Temp:     98.7 degrees F oral Pulse rate:   78 / minute Pulse rhythm:   regular Resp:     16 per minute BP sitting:   94 / 58  (left arm) Cuff size:   large  Vitals Entered By: Rock Nephew CMA (March 09, 2009 1:10 PM)  O2 Flow:  Room air  Primary Care Provider:  Etta Grandchild MD   History of Present Illness: She returns for f/up and says she feels 70% better but still has runny nose and np cough.  Asthma History    Initial Asthma Severity Rating:    Age range: 12+ years    Symptoms: 0-2 days/week    Nighttime Awakenings: 0-2/month    Interferes w/ normal activity: no limitations    SABA use (not for EIB): 0-2 days/week    Asthma Severity Assessment: Intermittent   Current Medications (verified): 1)  Synthroid 100 Mcg Tabs (Levothyroxine Sodium) .... One By Mouth Once Daily 2)  Maxalt 10 Mg  Tabs (Rizatriptan Benzoate) .... Take 1 Tablet By Mouth Once A Day As Needed 3)  Clonazepam 0.5 Mg Tabs (Clonazepam) .... One By Mouth At Bedtime Prn 4)  Valtrex 1 Gm Tabs (Valacyclovir Hcl) .... Take 1 Tablet By Mouth Once A Day 5)  Lunesta 2 Mg Tabs (Eszopiclone) .... Take 1 Tab By Mouth At Bedtime 6)  Nuvigil 150 Mg Tabs (Armodafinil) .... Once Daily 7)  Tramadol Hcl 50 Mg Tabs (Tramadol Hcl) .Marland Kitchen.. 1-2 By Mouth Qid As Needed For Pain 8)  Estradiol 0.1 Mg/24hr Ptwk (Estradiol) 9)  Pristiq 100 Mg Xr24h-Tab (Desvenlafaxine Succinate) .... One By Mouth Once Daily  Allergies (verified): 1)  ! Pcn 2)  ! Codeine 3)  ! Lyrica  Past History:  Past Medical History: Reviewed history from 03/15/2008 and no changes required. Hypothyroidism Anxiety Depression Headache Nephrolithiasis, hx of Fibromyalgia  Asthma  Hyperlipidemia Gout  Past Surgical History: Reviewed history from 12/31/2007 and no  changes required. Breast Reduction 1993 Hysterectomy Total knee replacement Appendectomy Cholecystectomy      Family History: Reviewed history from 01/19/2007 and no changes required. Mother has history of mini strokes and breast cancer. Father has history of coronary artery disease he is status post CABG   he also has history of hypertension, hyperlipidemia, peripheral vascular disease  Social History: Reviewed history from 12/31/2007 and no changes required. 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   General:  Denies chills, fatigue, fever, and malaise. ENT:  Complains of nasal congestion and postnasal drainage; denies decreased hearing, difficulty swallowing, ear discharge, earache, nosebleeds, ringing in ears, sinus pressure, and sore throat.  Physical Exam  General:  alert, well-developed, well-nourished, well-hydrated, appropriate dress, normal appearance, healthy-appearing, cooperative to examination, and good hygiene.   Head:  normocephalic, atraumatic, no abnormalities observed, and no abnormalities palpated.   Ears:  R ear normal and L ear normal.   Nose:  no mucosal pallor, no mucosal edema, no airflow obstruction, no intranasal foreign body,  no nasal polyps, no nasal mucosal lesions, no mucosal friability, no active bleeding or clots, no sinus percussion tenderness, no septum abnormalities, nasal dischargemucosal pallor, and mucosal edema.   Mouth:  Oral mucosa and oropharynx without lesions or exudates.  Teeth in good repair. Neck:  No deformities, masses, or tenderness noted. Lungs:  Normal respiratory effort, chest expands symmetrically. Lungs are clear to auscultation, no crackles or wheezes. Heart:   Normal rate and regular rhythm. S1 and S2 normal without gallop, murmur, click, rub or other extra sounds. Abdomen:  soft, non-tender, normal bowel sounds, no distention, no masses, no guarding, no rigidity, no hepatomegaly, and no splenomegaly.   Msk:  No deformity or scoliosis noted of thoracic or lumbar spine.   Pulses:  R and L carotid,radial,femoral,dorsalis pedis and posterior tibial pulses are full and equal bilaterally Extremities:  No clubbing, cyanosis, edema, or deformity noted with normal full range of motion of all joints.   Neurologic:  No cranial nerve deficits noted. Station and gait are normal. Plantar reflexes are down-going bilaterally. DTRs are symmetrical throughout. Sensory, motor and coordinative functions appear intact. Skin:  Intact without suspicious lesions or rashes Cervical Nodes:  no anterior cervical adenopathy and no posterior cervical adenopathy.   Axillary Nodes:  no R axillary adenopathy and no L axillary adenopathy.   Psych:  Cognition and judgment appear intact. Alert and cooperative with normal attention span and concentration. No apparent delusions, illusions, hallucinations   Impression & Recommendations:  Problem # 1:  BRONCHITIS-ACUTE (ICD-466.0) Assessment Unchanged  try depo-medrol The following medications were removed from the medication list:    Avelox 400 Mg Tabs (Moxifloxacin hcl) ..... One by mouth once daily for 7 days Her updated medication list for this problem includes:    Tussionex Pennkinetic Er 8-10 Mg/65ml Lqcr (Chlorpheniramine-hydrocodone) .Marland Kitchen... 2.5-5 ml by mouth two times a day as needed for cough  Orders: Admin of Therapeutic Inj  intramuscular or subcutaneous ([address]) Depo- Medrol 40mg  (J1030) Depo- Medrol 80mg  (J1040)  Complete Medication List: 1)  Synthroid 100 Mcg Tabs (Levothyroxine sodium) .... One by mouth once daily 2)  Maxalt 10 Mg Tabs (Rizatriptan benzoate) .... Take 1 tablet by mouth once a day as needed 3)   Clonazepam 0.5 Mg Tabs (Clonazepam) .... One by mouth at bedtime prn 4)  Valtrex 1 Gm Tabs (Valacyclovir hcl) .... Take 1 tablet by mouth once a day 5)  Lunesta 2 Mg Tabs (Eszopiclone) .... Take 1 tab by mouth at bedtime 6)  Nuvigil 150 Mg Tabs (Armodafinil) .... Once daily 7)  Tramadol Hcl 50 Mg Tabs (Tramadol hcl) .Marland Kitchen.. 1-2 by mouth qid as needed for pain 8)  Estradiol 0.1 Mg/24hr Ptwk (Estradiol) 9)  Pristiq 100 Mg Xr24h-tab (Desvenlafaxine succinate) .... One by mouth once daily 10)  Tussionex Pennkinetic Er 8-10 Mg/25ml Lqcr (Chlorpheniramine-hydrocodone) .... 2.5-5 ml by mouth two times a day as needed for cough  Patient Instructions: 1)  Please schedule a follow-up appointment in 1 month. 2)  Get plenty of rest, drink lots of clear liquids, and use Tylenol or Ibuprofen for fever and comfort. Return in 7-10 days if you're not better:sooner if you're feeling worse. Prescriptions: TUSSIONEX PENNKINETIC ER 8-10 MG/5ML LQCR (CHLORPHENIRAMINE-HYDROCODONE) 2.5-5 ml by mouth two times a day as needed for cough  #3 ounces x 0   Entered and Authorized by:   Etta Grandchild MD   Signed by:   Etta Grandchild MD on 03/09/2009   Method used:   Print then Give  to Patient   RxID:   [id number]    Medication Administration  Injection # 1:    Medication: Depo- Medrol 80mg     Diagnosis: BRONCHITIS-ACUTE (ICD-466.0)    Route: IM    Site: L deltoid    Exp Date: 10/2011    Lot #: [id number]    Mfr: pfizer    Patient tolerated injection without complications    Given by: Rock Nephew CMA (March 09, 2009 2:10 PM)  Injection # 2:    Medication: Depo- Medrol 40mg     Diagnosis: BRONCHITIS-ACUTE (ICD-466.0)    Route: IM    Site: L deltoid    Exp Date: 10/2011    Lot #: [id number]    Mfr: pfizer    Patient tolerated injection without complications    Given by: Rock Nephew CMA (March 09, 2009 2:10 PM)  Orders Added: 1)  Admin of Therapeutic Inj  intramuscular or subcutaneous [[address]] 2)   Depo- Medrol 40mg  [J1030] 3)  Depo- Medrol 80mg  [J1040] 4)  Est. Patient Level IV [[address]]

## 2010-03-05 NOTE — Assessment & Plan Note (Signed)
Summary: SINUS INFECTION/HEA   Vital Signs:  Patient Profile:   55 Years Old Female Height:     61 inches Weight:      133.25 pounds BMI:     25.27 Temp:     97.9 degrees F oral Pulse rate:   71 / minute Pulse rhythm:   regular Resp:     20 per minute BP sitting:   104 / 71  (right arm) Cuff size:   regular  Vitals Entered By: Glendell Docker CMA (December 23, 2007 3:00 PM)                 PCP:  Dondra Spry DO  Chief Complaint:  Sinus infection unresolved and yeast infection.  History of Present Illness: 55 y/o female notes sinus pressure has improved.   Her nasal discharge is usually clear.   She denies fever.  She has residual  nasal,head congestion,  and dry cough.  She reports vaginal yeast infection.  She has not started [facility] yet.    Current Allergies (reviewed today): ! PCN ! CODEINE  Past Medical History:    Hypothyroidism    Anxiety    Depression    Headache    Nephrolithiasis, hx of    Fibromyalgia        Past Surgical History:    Breast Reduction 1993    Hysterectomy    Total knee replacement    Appendectomy    Cholecystectomy         Social History:    Occupation:  Freight forwarder    Married with 4 children    Never Smoked    Alcohol use-no      Physical Exam  General:     alert, well-developed, and well-nourished.   Head:     normocephalic and atraumatic.   Ears:     R ear normal and L ear normal.   Mouth:     no exudates and postnasal drip.   Neck:     supple and no masses.   Lungs:     normal respiratory effort, normal breath sounds, and no wheezes.   Heart:     normal rate, regular rhythm, and no gallop.   Skin:     color normal.   Psych:     normally interactive and good eye contact.      Impression & Recommendations:  Problem # 1:  COUGH (ICD-786.2) Pt with post URI cough from residual post nasal gtt.   I advised against any more abx.   Trial of nasal saline and OTC claritin D as needed.   Patient advised to call office if symptoms persist or worsen.   If persistent cough, consider short prednisone taper.  Problem # 2:  CANDIDIASIS, VAGINAL (ICD-112.1) Yeast infection secondary to recent abx use.   Patient advised to call office if symptoms persist.  The following medications were removed from the medication list:    Fluconazole 150 Mg Tabs (Fluconazole) .Marland Kitchen... Take one tab by mouth now  Her updated medication list for this problem includes:    Fluconazole 150 Mg Tabs (Fluconazole) .Marland Kitchen... Take 1 tablet by mouth once a day x 3 days as directed   Complete Medication List: 1)  Synthroid 100 Mcg Tabs (Levothyroxine sodium) .... One by mouth once daily 2)  Paxil 20 Mg Tabs (Paroxetine hcl) .... Take 1 tablet by mouth once a day 3)  Premarin 0.625 Mg Tabs (Estrogens conjugated) .... Take 1 tablet by mouth once a  day 4)  Maxalt 10 Mg Tabs (Rizatriptan benzoate) .... Take 1 tablet by mouth once a day as needed 5)  Clonazepam 0.5 Mg Tabs (Clonazepam) .... 1/2 to one by mouth at bedtime prn 6)  Darvocet-n 100 100-650 Mg Tabs (Propoxyphene n-apap) .... One by mouth q 12 prn 7)  Pravastatin Sodium 20 Mg Tabs (Pravastatin sodium) .... One by mouth qpm 8)  Tussionex Pennkinetic Er 8-10 Mg/4ml Lqcr (Chlorpheniramine-hydrocodone) .... 5 ml q 12 hrs as needed 9)  Savella 50 Mg Tabs (Milnacipran hcl) .... One by mouth two times a day 10)  Fluconazole 150 Mg Tabs (Fluconazole) .... Take 1 tablet by mouth once a day x 3 days as directed   Patient Instructions: 1)  Use nasal saline two times a day. 2)  Claritin D as needed. 3)  Please schedule a follow-up appointment in 4 months. 4)  BMP prior to visit, ICD-9:  401.9 5)  AST, ALT  prior to visit, ICD-9:  272.4 6)  Lipid Panel prior to visit, ICD-9: 272.4 7)  TSH prior to visit, ICD-9: 272.4 8)  HbgA1C prior to visit, ICD-9: 790.29 9)  Please return for lab work within one month.  ([facility] office)   Prescriptions: FLUCONAZOLE 150 MG TABS  (FLUCONAZOLE) Take 1 tablet by mouth once a day x 3 days as directed  #3 x 0   Entered by:   Glendell Docker CMA   Authorized by:   D. Thomos Lemons DO   Signed by:   Glendell Docker CMA on 12/23/2007   Method used:   Electronically to        [facility] (retail)       [address]       [place], [place]  [address]       Ph: [phone] or [phone]       Fax: [phone]   RxID:   [id number] FLUCONAZOLE 40 MG/ML SUSR (FLUCONAZOLE) one by mouth once daily  #3 x 0   Entered and Authorized by:   D. Thomos Lemons DO   Signed by:   D. Thomos Lemons DO on 12/23/2007   Method used:   Electronically to        [facility] (retail)       [address]       [place], [place]  [address]       Ph: [phone] or [phone]       Fax: [phone]   RxID:   [id number]  ] Current Allergies (reviewed today): ! PCN ! CODEINE

## 2010-03-05 NOTE — Assessment & Plan Note (Signed)
Summary: f/u appt/flu shot/cd   Vital Signs:  Patient profile:   55 year old female Menstrual status:  postmenopausal Height:      61 inches Weight:      128 pounds BMI:     24.27 O2 Sat:      97 % on Room air Temp:     98.4 degrees F oral Pulse rate:   94 / minute Pulse rhythm:   regular Resp:     16 per minute BP sitting:   108 / 70  (left arm) Cuff size:   large  Vitals Entered By: Rock Nephew CMA (December 20, 2009 9:50 AM)  O2 Flow:  Room air CC: Pt here for med refills, also c/o  congestion and a nail fungus, URI symptoms, Lipid Management Is Patient Diabetic? No  Does patient need assistance? Functional Status Self care Ambulation Normal     Menstrual Status postmenopausal Last PAP Result normal   Primary Care Provider:  Etta Grandchild MD  CC:  Pt here for med refills, also c/o  congestion and a nail fungus, URI symptoms, and Lipid Management.  History of Present Illness:  URI Symptoms      This is a right-handed patient who presents with URI symptoms.  The symptoms began 2 weeks ago.  The severity is described as mild.  The patient reports nasal congestion and purulent nasal discharge, but denies clear nasal discharge, sore throat, dry cough, productive cough, earache, and sick contacts.  The patient denies fever, stiff neck, dyspnea, wheezing, rash, vomiting, diarrhea, use of an antipyretic, and response to antipyretic.  The patient also reports sneezing and seasonal symptoms.  The patient denies itchy watery eyes, itchy throat, response to antihistamine, headache, muscle aches, and severe fatigue.  Risk factors for Strep sinusitis include unilateral facial pain, unilateral nasal discharge, poor response to decongestant, double sickening, and absence of cough.  The patient denies the following risk factors for Strep sinusitis: tooth pain, Strep exposure, and tender adenopathy.    She also c/o right great toenail fungus.  Asthma History    Asthma Control  Assessment:    Age range: 12+ years    Symptoms: 0-2 days/week    Nighttime Awakenings: 0-2/month    Interferes w/ normal activity: no limitations    SABA use (not for EIB): 0-2 days/week    Asthma Control Assessment: Well Controlled  Lipid Management History:      Negative NCEP/ATP III risk factors include female age less than 7 years old, no history of early menopause without estrogen hormone replacement, non-diabetic, HDL cholesterol greater than 60, no family history for ischemic heart disease, non-tobacco-user status, non-hypertensive, no ASHD (atherosclerotic heart disease), no prior stroke/TIA, no peripheral vascular disease, and no history of aortic aneurysm.        The patient states that she knows about the "Therapeutic Lifestyle Change" diet.  Her compliance with the TLC diet is fair.  The patient expresses understanding of adjunctive measures for cholesterol lowering.  Adjunctive measures started by the patient include aerobic exercise, fiber, limit alcohol consumpton, and weight reduction.  She expresses no side effects from her lipid-lowering medication.  The patient denies any symptoms to suggest myopathy or liver disease.     Current Medications (verified): 1)  Synthroid 100 Mcg Tabs (Levothyroxine Sodium) .... One By Mouth Once Daily 2)  Maxalt 10 Mg  Tabs (Rizatriptan Benzoate) .... Take 1 Tablet By Mouth Once A Day As Needed 3)  Clonazepam 0.5 Mg Tabs (Clonazepam) .Marland KitchenMarland KitchenMarland Kitchen  One By Mouth At Bedtime Prn 4)  Valtrex 1 Gm Tabs (Valacyclovir Hcl) .... Take 1 Tablet By Mouth Once A Day 5)  Lunesta 2 Mg Tabs (Eszopiclone) .... Take 1 Tab By Mouth At Bedtime 6)  Tramadol Hcl 50 Mg Tabs (Tramadol Hcl) .... 2 Tabs Every 4-6hrs As Needed 7)  Estradiol 0.1 Mg/24hr Ptwk (Estradiol) 8)  Pristiq 50 Mg Xr24h-Tab (Desvenlafaxine Succinate) .... One By Mouth Once Daily 9)  Vitamin D3 50000 Unit Caps (Cholecalciferol) .... Once Weekly 10)  Nuvigil 150 Mg Tabs (Armodafinil) .... 1/2 Once Daily 11)   Crestor 20 Mg Tabs (Rosuvastatin Calcium) .... Take 1 Tablet By Mouth Once A Day  Allergies (verified): 1)  ! Pcn 2)  ! Codeine 3)  ! Lyrica  Past History:  Past Medical History: Last updated: 03/15/2008 Hypothyroidism Anxiety Depression Headache Nephrolithiasis, hx of Fibromyalgia  Asthma  Hyperlipidemia Gout  Past Surgical History: Last updated: 12/31/2007 Breast Reduction 1993 Hysterectomy Total knee replacement Appendectomy Cholecystectomy      Family History: Last updated: 01/19/2007 Mother has history of mini strokes and breast cancer. 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 Mother has history of mini strokes and breast cancer. Father has history of coronary artery disease he is status post CABG   he also has history of hypertension, hyperlipidemia, peripheral vascular disease  Social History: Reviewed history from 12/31/2007 and no changes required. 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   Derm:  Complains of changes in nail beds; denies changes in color  of skin, dryness, excessive perspiration, flushing, itching, lesion(s), poor wound healing, and rash. Psych:  Denies alternate hallucination ( auditory/visual), anxiety, depression, easily angered, easily tearful, irritability, mental problems, panic attacks, sense of great danger, suicidal thoughts/plans, thoughts of violence, unusual visions or sounds, and thoughts /plans of harming others. Endo:  Denies cold intolerance, excessive hunger, excessive thirst, excessive urination, heat intolerance, polyuria, and weight change.  Physical Exam  General:  alert, well-developed, well-nourished, well-hydrated, appropriate dress, normal appearance, healthy-appearing, cooperative to examination, and good hygiene.   Head:  normocephalic, atraumatic, no abnormalities observed, and no abnormalities palpated.   Eyes:  No corneal or conjunctival inflammation noted. EOMI. Perrla. Funduscopic exam benign, without hemorrhages, exudates or papilledema. Vision grossly normal. Ears:  R ear normal and no external deformities.   Nose:  no external deformity, no airflow obstruction, no intranasal foreign body, no nasal polyps, no nasal mucosal lesions, no mucosal friability, no active bleeding or clots, no septum abnormalities, nasal discharge, mucosal erythema, L maxillary sinus tenderness, and R maxillary sinus tenderness.   Mouth:  good dentition, no gingival abnormalities, pharynx pink and moist, no erythema, no exudates, no posterior lymphoid hypertrophy, no postnasal drip, no pharyngeal crowing, no lesions, no aphthous ulcers, no erosions, no tongue  abnormalities, no leukoplakia, and no petechiae.   Neck:  supple, full ROM, no masses, no thyromegaly, no thyroid nodules or tenderness, no JVD, normal carotid upstroke, no carotid bruits, no cervical lymphadenopathy, and no neck tenderness.   Lungs:  normal respiratory effort, no intercostal retractions, no accessory muscle use, normal breath sounds, no dullness, no  fremitus, no crackles, and no wheezes.   Heart:  normal rate, regular rhythm, no murmur, no gallop, no rub, and no JVD.   Abdomen:  soft, non-tender, normal bowel sounds, no distention, no masses, no guarding, no rigidity, no rebound tenderness, no abdominal hernia, no inguinal hernia, no hepatomegaly, and no splenomegaly.   Msk:  normal ROM, no joint tenderness, no joint swelling, no joint warmth, no redness over joints, no joint deformities, no joint instability, no crepitation, and no muscle atrophy.   Pulses:  R and L carotid,radial,femoral,dorsalis pedis and posterior tibial pulses are full and equal bilaterally Extremities:  No clubbing, cyanosis, edema, or deformity noted with normal full range of motion of all joints.   Neurologic:  alert & oriented X3, cranial nerves II-XII intact, strength normal in all extremities, sensation intact to light touch, sensation intact to pinprick, gait normal, and DTRs symmetrical and normal.   Skin:  right great toenail shows a small area of lysis and thickening over the distal lateral aspect, all other nails appear normal Cervical Nodes:  no anterior cervical adenopathy and no posterior cervical adenopathy.   Axillary Nodes:  no R axillary adenopathy and no L axillary adenopathy.   Inguinal Nodes:  no R inguinal adenopathy and no L inguinal adenopathy.   Psych:  Oriented X3, memory intact for recent and remote, normally interactive, good eye contact, not anxious appearing, not depressed appearing, not agitated, not suicidal, and not homicidal.     Impression & Recommendations:  Problem # 1:  ONYCHOMYCOSIS, TOENAILS (ICD-110.1) Assessment New she was given a printed document from [facility] on the use of Pen [place] Her updated medication list for this problem includes:    Ciclodan 8 % Soln (Ciclopirox) .Marland Kitchen... Apply as directed  Problem # 2:  SINUSITIS- ACUTE-NOS (ICD-461.9) Assessment: New  Her updated medication list for this problem includes:     Sulfamethoxazole-tmp Ds 800-160 Mg Tab (Trimethoprim-sulfamethoxazole) .Marland Kitchen... Take 1 tablet by mouth morning and night  Problem # 3:  ASTHMA (ICD-493.90) Assessment: Unchanged  Her updated medication list for this problem includes:    Dulera 200-5 Mcg/act Aero (Mometasone furo-formoterol fum) .Marland Kitchen... 2 puffs two times a day  Pulmonary Functions Reviewed: O2 sat: 97 (12/20/2009)  Problem # 4:  DEPRESSION (ICD-311) Assessment: Improved  Her updated medication list for this problem includes:    Clonazepam 0.5 Mg Tabs (Clonazepam) ..... One by mouth at bedtime prn    Pristiq 50 Mg Xr24h-tab (Desvenlafaxine succinate) ..... One by mouth once daily  Discussed treatment options, including trial of antidpressant medication. Will refer to behavioral health. Follow-up call in in 24-48 hours and recheck in 2 weeks, sooner as needed. Patient agrees to call if any worsening of symptoms or thoughts of doing harm arise. Verified that the patient has no suicidal ideation at this time.   Problem # 5:  DYSLIPIDEMIA (ICD-272.4) Assessment: Unchanged  Her updated medication list for this problem includes:    Crestor 20 Mg Tabs (Rosuvastatin calcium) .Marland Kitchen... Take 1 tablet by mouth once a day  Labs Reviewed: SGOT: 23 (04/04/2009)   SGPT: 34 (04/04/2009)  Lipid Goals: Chol Goal: 200 (06/27/2008)   HDL  Goal: 40 (06/27/2008)   LDL Goal: 160 (06/27/2008)   TG Goal: 150 (06/27/2008)  Prior 10 Yr Risk Heart Disease: Not enough information (06/27/2008)   HDL:65.50 (06/27/2008), 62.2 (03/15/2008)  LDL:DEL (03/15/2008), DEL (03/03/2007)  Chol:193 (06/27/2008), 263 (03/15/2008)  Trig:232.0 (06/27/2008), 276 (03/15/2008)  Complete Medication List: 1)  Synthroid 100 Mcg Tabs (Levothyroxine sodium) .... One by mouth once daily 2)  Maxalt 10 Mg Tabs (Rizatriptan benzoate) .... Take 1 tablet by mouth once a day as needed 3)  Clonazepam 0.5 Mg Tabs (Clonazepam) .... One by mouth at bedtime prn 4)  Valtrex 1 Gm Tabs  (Valacyclovir hcl) .... Take 1 tablet by mouth once a day 5)  Lunesta 2 Mg Tabs (Eszopiclone) .... Take 1 tab by mouth at bedtime 6)  Tramadol Hcl 50 Mg Tabs (Tramadol hcl) .... 2 tabs every 4-6hrs as needed 7)  Estradiol 0.1 Mg/24hr Ptwk (Estradiol) 8)  Pristiq 50 Mg Xr24h-tab (Desvenlafaxine succinate) .... One by mouth once daily 9)  Vitamin D3 50000 Unit Caps (Cholecalciferol) .... Once weekly 10)  Nuvigil 150 Mg Tabs (Armodafinil) .... 1/2 once daily 11)  Crestor 20 Mg Tabs (Rosuvastatin calcium) .... Take 1 tablet by mouth once a day 12)  Ciclodan 8 % Soln (Ciclopirox) .... Apply as directed 13)  Sulfamethoxazole-tmp Ds 800-160 Mg Tab (Trimethoprim-sulfamethoxazole) .... Take 1 tablet by mouth morning and night 14)  Dulera 200-5 Mcg/act Aero (Mometasone furo-formoterol fum) .... 2 puffs two times a day  Lipid Assessment/Plan:      Based on NCEP/ATP III, the patient's risk factor category is "0-1 risk factors".  The patient's lipid goals are as follows: Total cholesterol goal is 200; LDL cholesterol goal is 160; HDL cholesterol goal is 40; Triglyceride goal is 150.     Patient Instructions: 1)  Please schedule a follow-up appointment in 2 months. 2)  Take your antibiotic as prescribed until ALL of it is gone, but stop if you develop a rash or swelling and contact our office as soon as possible. 3)  Acute sinusitis symptoms for less than 10 days are not helped by antibiotics.Use warm moist compresses, and over the counter decongestants ( only as directed). Call if no improvement in 5-7 days, sooner if increasing pain, fever, or new symptoms. Prescriptions: DULERA 200-5 MCG/ACT AERO (MOMETASONE FURO-FORMOTEROL FUM) 2 puffs two times a day  #4 inhs x 0   Entered and Authorized by:   Etta Grandchild MD   Signed by:   Etta Grandchild MD on 12/20/2009   Method used:   Samples Given   RxID:   [id number] SULFAMETHOXAZOLE-TMP DS 800-160 MG TAB (TRIMETHOPRIM-SULFAMETHOXAZOLE) Take 1 tablet  by mouth morning and night  #20 x 0   Entered and Authorized by:   Etta Grandchild MD   Signed by:   Etta Grandchild MD on 12/20/2009   Method used:   Print then Give to Patient   RxID:   [id number] CICLODAN 8 % SOLN (CICLOPIROX) apply as directed  #1 x 1   Entered and Authorized by:   Etta Grandchild MD   Signed by:   Etta Grandchild MD on 12/20/2009   Method used:   Print then Give to Patient   RxID:   [id number]    Orders Added: 1)  Est. Patient Level V [59563]

## 2010-03-05 NOTE — Progress Notes (Signed)
Summary: Refill request  Phone Note Refill Request Message from:  Fax from Pharmacy  Refills Requested: Medication #1:  DARVOCET-N 100 100-650 MG  TABS one by mouth q 12 prn   Dosage confirmed as above?Dosage Confirmed   Brand Name Necessary? No   Supply Requested: 1 month   Last Refilled: 05/16/2008 [id number] [place] phone [phone] fax [phone]  Initial call taken by: Zackery Barefoot CMA,  Jun 23, 2008 4:28 PM  Follow-up for Phone Call        ok Follow-up by: Etta Grandchild MD,  Jun 26, 2008 7:20 AM  Additional Follow-up for Phone Call Additional follow up Details #1::        Rx called to pharmacy Additional Follow-up by: Zackery Barefoot CMA,  Jun 26, 2008 10:44 AM      Prescriptions: DARVOCET-N 100 100-650 MG  TABS (PROPOXYPHENE N-APAP) one by mouth q 12 prn  #60 x 2   Entered by:   Zackery Barefoot CMA   Authorized by:   Jacques Navy MD   Signed by:   Zackery Barefoot CMA on 06/26/2008   Method used:   Telephoned to ...       [facility] [place] [address] (retail)       [address]       [place], [place]  [address]       Ph: [phone] or [phone]       Fax: [phone]   RxID:   [id number]

## 2010-03-05 NOTE — Progress Notes (Signed)
Summary: YEAST INFECTION  Phone Note Call from Patient Call back at Home Phone [phone]   Caller: Patient Reason for Call: Acute Illness Summary of Call: PT CALLED STATING SHE NEEDS SOMETHING FOR A YEAST INFECTION FROM ALL THE ANTIBIOTICS SHE HAS BEEN ON.  PLEASE CALL PT AT HOME [phone] OR CELL [phone] Initial call taken by: Eliezer Lofts,  December 17, 2007 9:01 AM  Follow-up for Phone Call        Will give Diflucan.  Please call and let patient know that Rx sent electronically Follow-up by: Paulo Fruit MD,  December 17, 2007 9:08 AM  Additional Follow-up for Phone Call Additional follow up Details #1::        Patient advised. Additional Follow-up by: Darra Lis RMA,  December 17, 2007 9:32 AM    New/Updated Medications: FLUCONAZOLE 150 MG TABS (FLUCONAZOLE) take one tab by mouth now   Prescriptions: FLUCONAZOLE 150 MG TABS (FLUCONAZOLE) take one tab by mouth now  #1 x 0   Entered and Authorized by:   Paulo Fruit MD   Signed by:   Paulo Fruit MD on 12/17/2007   Method used:   Electronically to        [facility] (retail)       [address]       [place], [place]  [address]       Ph: [phone] or [phone]       Fax: [phone]   RxID:   [id number]

## 2010-03-05 NOTE — Progress Notes (Signed)
Summary:  medication refill  Phone Note Call from Patient Call back at Home Phone [phone]   Caller: Patient/[phone] Call For: Etta Grandchild MD Reason for Call: Refill Medication Summary of Call: per Otho Ket call need a rx for PRISTIQ 50 MG XR24H-TAB   [facility] [place] [address] (retail) [address] [place], [place]  [address] Ph: [phone] or [phone] Fax: [phone] Initial call taken by: Shelbie Proctor,  July 26, 2008 10:59 AM  Follow-up for Phone Call        Med was discontinued on 12/13/07. Need follow-up appt with dr. Yetta Barre if she want refill. Per chart pt should be taking paxil Follow-up by: Orlan Leavens,  July 26, 2008 11:29 AM  Additional Follow-up for Phone Call Additional follow up Details #1::        pt states Dr Yetta Barre save her samples of the PRISTIQ on 06-27-2008 ov it was updated in her med list  Additional Follow-up by: Shelbie Proctor,  July 26, 2008 11:32 AM    Additional Follow-up for Phone Call Additional follow up Details #2::    Ok md rx pristig on 06/27/08 will send med to pharm. she is due for her 1 month follow-up for addtional refills. pls let pt know Follow-up by: Orlan Leavens,  July 26, 2008 11:36 AM  Additional Follow-up for Phone Call Additional follow up Details #3:: Details for Additional Follow-up Action Taken: called pt to inform rx was sent pt was informed  Additional Follow-up by: Shelbie Proctor,  July 26, 2008 11:52 AM    Prescriptions: PRISTIQ 50 MG XR24H-TAB (DESVENLAFAXINE SUCCINATE) one q day  #28 x 0   Entered by:   Orlan Leavens   Authorized by:   Etta Grandchild MD   Signed by:   Orlan Leavens on 07/26/2008   Method used:   Electronically to        [facility] (retail)       [address]       [place], [place]  [address]       Ph: [phone] or [phone]       Fax: [phone]   RxID:   [id number]

## 2010-03-05 NOTE — Miscellaneous (Signed)
Summary: Darvocet   Medications Added DARVOCET-N 100 100-650 MG  TABS (PROPOXYPHENE N-APAP) one by mouth q 12 prn       Clinical Lists Changes  Medications: Changed medication from DARVOCET-N 100 100-650 MG  TABS (PROPOXYPHENE N-APAP) one by mouth q 12 prn to DARVOCET-N 100 100-650 MG  TABS (PROPOXYPHENE N-APAP) one by mouth q 12 prn - Signed Rx of DARVOCET-N 100 100-650 MG  TABS (PROPOXYPHENE N-APAP) one by mouth q 12 prn;  #60 x 3;  Signed;  Entered by: Glendell Docker;  Authorized by: Dondra Spry DO;  Method used: Telephoned to [facility] [address], [place], [place], [place]  [address], Ph: [phone] or [phone], Fax: [phone]    Prescriptions: DARVOCET-N 100 100-650 MG  TABS (PROPOXYPHENE N-APAP) one by mouth q 12 prn  #60 x 3   Entered by:   Glendell Docker   Authorized by:   Dondra Spry DO   Signed by:   Glendell Docker on 02/17/2007   Method used:   Telephoned to ...       [facility] [place] [address]       [address]       [place], [place]  [address]       Ph: [phone] or [phone]       Fax: [phone]   RxID:   [id number]   RX FAXED TO PHARMACY ON 02/16/07 @ [phone] ..................................................................Marland KitchenGlendell Docker  February 17, 2007 4:56 PM

## 2010-03-05 NOTE — Progress Notes (Signed)
Summary: Clonazepam Refill  Phone Note Refill Request Message from:  Fax from Pharmacy on October 24, 2008 4:49 PM  Refills Requested: Medication #1:  CLONAZEPAM 0.5 MG TABS 1/2 to one by mouth at bedtime prn   Dosage confirmed as above?Dosage Confirmed   Brand Name Necessary? No   Supply Requested: 1 month   Last Refilled: 05/06/2008  Is this ok to fill   Method Requested: Telephone to Pharmacy Next Appointment Scheduled: No Future Appointments on File Initial call taken by: Glendell Docker CMA,  October 24, 2008 4:50 PM  Follow-up for Phone Call        yes, this is ok Follow-up by: Etta Grandchild MD,  October 26, 2008 9:27 AM    Prescriptions: CLONAZEPAM 0.5 MG TABS (CLONAZEPAM) 1/2 to one by mouth at bedtime prn  #30 x 5   Entered by:   Rock Nephew CMA   Authorized by:   Etta Grandchild MD   Signed by:   Rock Nephew CMA on 10/27/2008   Method used:   Telephoned to ...       [facility] [place] [address] (retail)       [address]       [place], [place]  [address]       Ph: [phone] or [phone]       Fax: [phone]   RxID:   [id number]

## 2010-03-05 NOTE — Progress Notes (Signed)
  Phone Note Other Incoming   Request: Send information Summary of Call: Records received from [facility]. 5 pages sent to Dr. Yetta Barre for review.

## 2010-03-05 NOTE — Progress Notes (Signed)
Summary: Not feeeling any better  Phone Note Call from Patient Call back at Home Phone [phone]   Caller: Patient Reason for Call: Acute Illness Summary of Call: Patient called and left voice message stating she was asked to call Monday if she was not any better by Sunday. She states her cough is not any better, and she would like to know if the Prednisone dose pack could be sent to [facility] [address] Initial call taken by: Darlene Knight CMA,  December 27, 2007 11:16 AM  Follow-up for Phone Call        call pt - pred called in.   Call office if not better within 1 week. Follow-up by: D. Robert Chassidy Layson DO,  December 27, 2007 1:06 PM  Additional Follow-up for Phone Call Additional follow up Details #1::        attempted to contact patient at [phone],husband states patient was not available. Husband informed as per Dr Renny Gunnarson instructions Additional Follow-up by: Darlene Knight CMA,  December 27, 2007 1:46 PM    New/Updated Medications: PREDNISONE 10 MG TABS (PREDNISONE) 3 tabs by mouth once daily x 3 days, 2 tabs by mouth once daily x 3 days, 1 tab by mouth once daily x 3 days   Prescriptions: PREDNISONE 10 MG TABS (PREDNISONE) 3 tabs by mouth once daily x 3 days, 2 tabs by mouth once daily x 3 days, 1 tab by mouth once daily x 3 days  #18 x 0   Entered and Authorized by:   D. Robert Ryin Schillo DO   Signed by:   D. Robert Lonny Eisen DO on 12/27/2007   Method used:   Electronically to        [facility] [id number] (retail)       [address]       [place], [place]  [address]       Ph: [phone] or [phone]       Fax: [phone]   RxID:   [id number]

## 2010-03-05 NOTE — Letter (Signed)
Summary: Consult/[facility]  Consult/[facility]   Imported By: Sherian Rein 12/08/2008 14:27:26  _____________________________________________________________________  External Attachment:    Type:   Image     Comment:   External Document

## 2010-03-05 NOTE — Assessment & Plan Note (Signed)
Summary: still not feeling, well/cd   Vital Signs:  Patient profile:   55 year old female Height:      61 inches Weight:      124 pounds BMI:     23.51 O2 Sat:      98 % on Room air Temp:     98.7 degrees F oral Pulse rate:   77 / minute Pulse rhythm:   regular Resp:     16 per minute BP sitting:   102 / 70  (left arm) Cuff size:   large  Vitals Entered By: Rock Nephew CMA (April 04, 2009 2:36 PM)  O2 Flow:  Room air CC: discuss lab orders and continued sore throat and cough   Primary Care Provider:  Etta Grandchild MD  CC:  discuss lab orders and continued sore throat and cough.  History of Present Illness: She returns with NP cough, fatigue, sore throat, intermittent LGF, and laryngitis.  Asthma History    Asthma Control Assessment:    Age range: 12+ years    Symptoms: throughout the day    Nighttime Awakenings: 1-3/week    Interferes w/ normal activity: some limitations    SABA use (not for EIB): 0-2 days/week    ATAQ questionnaire: 1-2    Asthma Control Assessment: Very Poorly Controlled   Current Medications (verified): 1)  Synthroid 100 Mcg Tabs (Levothyroxine Sodium) .... One By Mouth Once Daily 2)  Maxalt 10 Mg  Tabs (Rizatriptan Benzoate) .... Take 1 Tablet By Mouth Once A Day As Needed 3)  Clonazepam 0.5 Mg Tabs (Clonazepam) .... One By Mouth At Bedtime Prn 4)  Valtrex 1 Gm Tabs (Valacyclovir Hcl) .... Take 1 Tablet By Mouth Once A Day 5)  Lunesta 2 Mg Tabs (Eszopiclone) .... Take 1 Tab By Mouth At Bedtime 6)  Nuvigil 150 Mg Tabs (Armodafinil) .... Once Daily 7)  Tramadol Hcl 50 Mg Tabs (Tramadol Hcl) .Marland Kitchen.. 1-2 By Mouth Qid As Needed For Pain 8)  Estradiol 0.1 Mg/24hr Ptwk (Estradiol) 9)  Pristiq 100 Mg Xr24h-Tab (Desvenlafaxine Succinate) .... One By Mouth Once Daily 10)  Tussionex Pennkinetic Er 8-10 Mg/29ml Lqcr (Chlorpheniramine-Hydrocodone) .... 2.5-5 Ml By Mouth Two Times A Day As Needed For Cough  Allergies (verified): 1)  ! Pcn 2)  !  Codeine 3)  ! Lyrica  Past History:  Past Medical History: Reviewed history from 03/15/2008 and no changes required. Hypothyroidism Anxiety Depression Headache Nephrolithiasis, hx of Fibromyalgia  Asthma  Hyperlipidemia Gout  Past Surgical History: Reviewed history from 12/31/2007 and no changes required. Breast Reduction 1993 Hysterectomy Total knee replacement Appendectomy Cholecystectomy      Family History: Reviewed history from 01/19/2007 and no changes required. Mother has history of mini strokes and breast cancer. Father has history of coronary artery disease he is status post CABG   he also has history of hypertension, hyperlipidemia, peripheral vascular disease  Social History: Reviewed history from 12/31/2007 and no changes required. 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  The patient denies anorexia, weight loss, weight gain, chest pain, syncope, dyspnea on exertion, peripheral edema, headaches, hemoptysis, abdominal pain, melena, hematochezia, severe indigestion/heartburn, hematuria, suspicious skin lesions, difficulty walking, depression, abnormal bleeding, enlarged lymph nodes, and angioedema.   General:  Complains of fatigue and malaise; denies chills, fever, loss of appetite, sleep disorder, sweats, weakness, and weight  loss. Resp:  Complains of cough; denies chest discomfort, chest pain with inspiration, coughing up blood, pleuritic, shortness of breath, sputum productive, and wheezing. GI:  Denies abdominal pain, bloody stools, change in bowel habits, dark tarry stools, diarrhea, indigestion, loss of appetite, nausea, vomiting, and vomiting blood. Endo:  Denies cold intolerance, excessive hunger, excessive thirst, excessive urination, heat intolerance, polyuria, and weight change.  Physical Exam  General:   alert, well-developed, well-nourished, well-hydrated, appropriate dress, normal appearance, healthy-appearing, cooperative to examination, and good hygiene.  She has a rare NP cough but no resp. distress, trismus, or stridor. Head:  normocephalic, atraumatic, no abnormalities observed, and no abnormalities palpated.   Eyes:  vision grossly intact, pupils equal, pupils round, and pupils reactive to light.  pink, moist mm., Ears:  R ear normal and L ear normal.   Mouth:  Oral mucosa and oropharynx without lesions or exudates.  Teeth in good repair. Neck:  No deformities, masses, or tenderness noted. Lungs:  normal respiratory effort, no intercostal retractions, no accessory muscle use, normal breath sounds, no dullness, no fremitus, no crackles, and no wheezes.   Heart:  normal rate, regular rhythm, no murmur, no gallop, no rub, and no JVD.   Abdomen:  soft, non-tender, normal bowel sounds, no distention, no masses, no guarding, no rigidity, no rebound tenderness, no hepatomegaly, and no splenomegaly.   Msk:  No deformity or scoliosis noted of thoracic or lumbar spine.   Pulses:  R and L carotid,radial,femoral,dorsalis pedis and posterior tibial pulses are full and equal bilaterally Extremities:  No clubbing, cyanosis, edema, or deformity noted with normal full range of motion of all joints.   Neurologic:  No cranial nerve deficits noted. Station and gait are normal. Plantar reflexes are down-going bilaterally. DTRs are symmetrical throughout. Sensory, motor and coordinative functions appear intact. Skin:  turgor normal, color normal, no rashes, no suspicious lesions, no ecchymoses, no petechiae, no purpura, no ulcerations, and no edema.   Cervical Nodes:  no anterior cervical adenopathy and no posterior cervical adenopathy.   Axillary Nodes:  no R axillary adenopathy and no L axillary adenopathy.   Inguinal Nodes:  no R inguinal adenopathy and no L inguinal adenopathy.   Psych:  Cognition and  judgment appear intact. Alert and cooperative with normal attention span and concentration. No apparent delusions, illusions, hallucinations   Impression & Recommendations:  Problem # 1:  FATIGUE (ICD-780.79) Assessment New  Orders: Venipuncture (19147) TLB-BMP (Basic Metabolic Panel-BMET) (80048-METABOL) TLB-CBC Platelet - w/Differential (85025-CBCD) TLB-Hepatic/Liver Function Pnl (80076-HEPATIC) TLB-TSH (Thyroid Stimulating Hormone) (84443-TSH) TLB-Sedimentation Rate (ESR) (85652-ESR) TLB-CK Total Only(Creatine Kinase/CPK) (82550-CK) TLB-Mono Test (Monospot) (82956-OZHY)  Problem # 2:  ASTHMA (ICD-493.90) Assessment: Deteriorated  Her updated medication list for this problem includes:    Dulera 100-5 Mcg/act Aero (Mometasone furo-formoterol fum) .Marland Kitchen... 2 puffs two times a day  Problem # 3:  HYPOTHYROIDISM (ICD-244.9) Assessment: Unchanged  Her updated medication list for this problem includes:    Synthroid 100 Mcg Tabs (Levothyroxine sodium) ..... One by mouth once daily  Orders: Venipuncture (86578) TLB-BMP (Basic Metabolic Panel-BMET) (80048-METABOL) TLB-CBC Platelet - w/Differential (85025-CBCD) TLB-Hepatic/Liver Function Pnl (80076-HEPATIC) TLB-TSH (Thyroid Stimulating Hormone) (84443-TSH) TLB-Sedimentation Rate (ESR) (85652-ESR) TLB-CK Total Only(Creatine Kinase/CPK) (82550-CK) TLB-Mono Test (Monospot) [phone])  Labs Reviewed: TSH: 0.75 (06/27/2008)    Chol: 193 (06/27/2008)   HDL: 65.50 (06/27/2008)   LDL: DEL (03/15/2008)   TG: 232.0 (06/27/2008)  Problem # 4:  FIBROMYALGIA (ICD-729.1) Assessment: Deteriorated  Her updated medication list for this problem includes:  Tramadol Hcl 50 Mg Tabs (Tramadol hcl) .Marland Kitchen... 1-2 by mouth qid as needed for pain  Orders: Venipuncture ([address]) TLB-BMP (Basic Metabolic Panel-BMET) (80048-METABOL) TLB-CBC Platelet - w/Differential (85025-CBCD) TLB-Hepatic/Liver Function Pnl (80076-HEPATIC) TLB-TSH (Thyroid Stimulating  Hormone) (84443-TSH) TLB-Sedimentation Rate (ESR) (85652-ESR) TLB-CK Total Only(Creatine Kinase/CPK) (82550-CK) TLB-Mono Test (Monospot) (60454-UJWJ)  Complete Medication List: 1)  Synthroid 100 Mcg Tabs (Levothyroxine sodium) .... One by mouth once daily 2)  Maxalt 10 Mg Tabs (Rizatriptan benzoate) .... Take 1 tablet by mouth once a day as needed 3)  Clonazepam 0.5 Mg Tabs (Clonazepam) .... One by mouth at bedtime prn 4)  Valtrex 1 Gm Tabs (Valacyclovir hcl) .... Take 1 tablet by mouth once a day 5)  Lunesta 2 Mg Tabs (Eszopiclone) .... Take 1 tab by mouth at bedtime 6)  Nuvigil 150 Mg Tabs (Armodafinil) .... Once daily 7)  Tramadol Hcl 50 Mg Tabs (Tramadol hcl) .Marland Kitchen.. 1-2 by mouth qid as needed for pain 8)  Estradiol 0.1 Mg/24hr Ptwk (Estradiol) 9)  Pristiq 100 Mg Xr24h-tab (Desvenlafaxine succinate) .... One by mouth once daily 10)  Tussionex Pennkinetic Er 8-10 Mg/60ml Lqcr (Chlorpheniramine-hydrocodone) .... 2.5-5 ml by mouth two times a day as needed for cough 11)  Dulera 100-5 Mcg/act Aero (Mometasone furo-formoterol fum) .... 2 puffs two times a day  Patient Instructions: 1)  Please schedule a follow-up appointment in 2 weeks. 2)  It is important that you exercise regularly at least 20 minutes 5 times a week. If you develop chest pain, have severe difficulty breathing, or feel very tired , stop exercising immediately and seek medical attention. Prescriptions: DULERA 100-5 MCG/ACT AERO (MOMETASONE FURO-FORMOTEROL FUM) 2 puffs two times a day  #3 inhs. x 0   Entered and Authorized by:   Etta Grandchild MD   Signed by:   Etta Grandchild MD on 04/04/2009   Method used:   Samples Given   RxID:   [id number]

## 2010-03-05 NOTE — Letter (Signed)
Summary: Results Follow-up Letter  [facility]  [address] [place], [place] [address]   Phone: [phone]  Fax: [phone]    04/06/2009  [address] [place], [place]  [address]  Dear Ms. YAN,   The following are the results of your recent test(s):  Test     Result     CBC       normal Liver/kidney   normal Thyroid     normal Inflammation   negative Mono       negative   _________________________________________________________  Please call for an appointment as needed _________________________________________________________ _________________________________________________________ _________________________________________________________  Sincerely,  Sanda Linger MD [facility]

## 2010-03-05 NOTE — Assessment & Plan Note (Signed)
Summary: shakey,nausea-feel funny--med reaction???-stc   Vital Signs:  Patient profile:   55 year old female Height:      61 inches Weight:      129 pounds BMI:     24.46 O2 Sat:      96 % on Room air Temp:     97.2 degrees F oral Pulse rate:   103 / minute Pulse rhythm:   regular Resp:     16 per minute BP sitting:   122 / 80  (left arm) Cuff size:   large  Vitals Entered By: Brenton Grills (May 09, 2009 3:36 PM)  O2 Flow:  Room air CC: pt c/o of nausea/restlessness/anxiety/shakes, pt states she is concerned it is either nuvigil or pristiq medication/aj, Depression   Primary Care Provider:  Etta Grandchild MD  CC:  pt c/o of nausea/restlessness/anxiety/shakes, pt states she is concerned it is either nuvigil or pristiq medication/aj, and Depression.  History of Present Illness: Pt reports feeling shaky, nauseous, and nervous since Sept. These symptoms have worsened since she incresaed the dose of Pristiq.  Depression History:      Positive alarm features for depression include insomnia, psychomotor agitation, and fatigue (loss of energy).  However, she denies significant weight loss, significant weight gain, hypersomnia, psychomotor retardation, feelings of worthlessness (guilt), impaired concentration (indecisiveness), and recurrent thoughts of death or suicide.  Positive alarm features for a manic disorder include abnormally & persistently irritable mood, less need for sleep, distractibility, and psychomotor agitation.  She denies persistently & abnormally elevated mood, flight of ideas, increase in goal-directed activity, inflated self-esteem or grandiosity, excessive buying sprees, excessive sexual indiscretions, and excessive foolish business investments.        Risk factors for depression include a personal history of depression.  The patient denies that she feels like life is not worth living, denies that she wishes that she were dead, and denies that she has thought about  ending her life.         Depression Treatment History:  Prior Medication Used:   Start Date: Assessment of Effect:   Comments:  Paxil (paroxetine)     05/26/2001   some improvement     --   Preventive Screening-Counseling & Management  Alcohol-Tobacco     Alcohol drinks/day: 0     Smoking Status: never     Passive Smoke Exposure: no  Hep-HIV-STD-Contraception     HIV Risk: no      Drug Use:  no.    Clinical Review Panels:  Lipid Management   Cholesterol:  193 (06/27/2008)   LDL (bad choesterol):  DEL (03/15/2008)   HDL (good cholesterol):  65.50 (06/27/2008)  Diabetes Management   Creatinine:  0.6 (04/04/2009)   Last Flu Vaccine:  Fluvax 3+ (12/28/2007)   Last Pneumovax:  Pneumovax (12/28/2007)  CBC   WBC:  5.8 (04/04/2009)   RBC:  4.63 (04/04/2009)   Hgb:  14.8 (04/04/2009)   Hct:  44.2 (04/04/2009)   Platelets:  280.0 (04/04/2009)   MCV  95.6 (04/04/2009)   MCHC  33.4 (04/04/2009)   RDW  11.9 (04/04/2009)   PMN:  49.3 (04/04/2009)   Lymphs:  37.2 (04/04/2009)   Monos:  7.1 (04/04/2009)   Eosinophils:  2.5 (04/04/2009)   Basophil:  3.9 (04/04/2009)  Complete Metabolic Panel   Glucose:  95 (04/04/2009)   Sodium:  140 (04/04/2009)   Potassium:  3.7 (04/04/2009)   Chloride:  102 (04/04/2009)   CO2:  30 (04/04/2009)   BUN:  5 (04/04/2009)   Creatinine:  0.6 (04/04/2009)   Albumin:  4.0 (04/04/2009)   Total Protein:  7.6 (04/04/2009)   Calcium:  9.0 (04/04/2009)   Total Bili:  0.2 (04/04/2009)   Alk Phos:  86 (04/04/2009)   SGPT (ALT):  34 (04/04/2009)   SGOT (AST):  23 (04/04/2009)   Medications Prior to Update: 1)  Synthroid 100 Mcg Tabs (Levothyroxine Sodium) .... One By Mouth Once Daily 2)  Maxalt 10 Mg  Tabs (Rizatriptan Benzoate) .... Take 1 Tablet By Mouth Once A Day As Needed 3)  Clonazepam 0.5 Mg Tabs (Clonazepam) .... One By Mouth At Bedtime Prn 4)  Valtrex 1 Gm Tabs (Valacyclovir Hcl) .... Take 1 Tablet By Mouth Once A Day 5)  Lunesta 2 Mg  Tabs (Eszopiclone) .... Take 1 Tab By Mouth At Bedtime 6)  Nuvigil 150 Mg Tabs (Armodafinil) .... Once Daily 7)  Tramadol Hcl 50 Mg Tabs (Tramadol Hcl) .Marland Kitchen.. 1-2 By Mouth Qid As Needed For Pain 8)  Estradiol 0.1 Mg/24hr Ptwk (Estradiol) 9)  Pristiq 100 Mg Xr24h-Tab (Desvenlafaxine Succinate) .... One By Mouth Once Daily 10)  Tussionex Pennkinetic Er 8-10 Mg/55ml Lqcr (Chlorpheniramine-Hydrocodone) .... 2.5-5 Ml By Mouth Two Times A Day As Needed For Cough 11)  Dulera 100-5 Mcg/act Aero (Mometasone Furo-Formoterol Fum) .... 2 Puffs Two Times A Day  Current Medications (verified): 1)  Synthroid 100 Mcg Tabs (Levothyroxine Sodium) .... One By Mouth Once Daily 2)  Maxalt 10 Mg  Tabs (Rizatriptan Benzoate) .... Take 1 Tablet By Mouth Once A Day As Needed 3)  Clonazepam 0.5 Mg Tabs (Clonazepam) .... One By Mouth At Bedtime Prn 4)  Valtrex 1 Gm Tabs (Valacyclovir Hcl) .... Take 1 Tablet By Mouth Once A Day 5)  Lunesta 2 Mg Tabs (Eszopiclone) .... Take 1 Tab By Mouth At Bedtime 6)  Tramadol Hcl 50 Mg Tabs (Tramadol Hcl) .Marland Kitchen.. 1-2 By Mouth Qid As Needed For Pain 7)  Estradiol 0.1 Mg/24hr Ptwk (Estradiol) 8)  Pristiq 50 Mg Xr24h-Tab (Desvenlafaxine Succinate) .... One By Mouth Once Daily 9)  Dulera 100-5 Mcg/act Aero (Mometasone Furo-Formoterol Fum) .... 2 Puffs Two Times A Day  Allergies (verified): 1)  ! Pcn 2)  ! Codeine 3)  ! Lyrica  Past History:  Past Medical History: Reviewed history from 03/15/2008 and no changes required. Hypothyroidism Anxiety Depression Headache Nephrolithiasis, hx of Fibromyalgia  Asthma  Hyperlipidemia Gout  Past Surgical History: Reviewed history from 12/31/2007 and no changes required. Breast Reduction 1993 Hysterectomy Total knee replacement Appendectomy Cholecystectomy      Family History: Reviewed history from 01/19/2007 and no changes required. Mother has history of mini strokes and breast cancer. Father has history of coronary artery disease  he is status post CABG   he also has history of hypertension, hyperlipidemia, peripheral vascular disease  Social History: Reviewed history from 12/31/2007 and no changes required. 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   Resp:  Denies chest discomfort, chest pain with inspiration, cough, coughing up blood, pleuritic, shortness of breath, sputum productive, and wheezing. Psych:  Complains of anxiety, depression, easily angered, easily tearful, and irritability; denies alternate hallucination ( auditory/visual), panic attacks, sense of great danger, suicidal thoughts/plans, thoughts of violence, unusual  visions or sounds, and thoughts /plans of harming others.  Physical Exam  General:  alert, well-developed, well-nourished, well-hydrated, appropriate dress, normal appearance, healthy-appearing, cooperative to examination, and good hygiene.  She has a rare NP cough but no resp. distress, trismus, or stridor. Head:  normocephalic, atraumatic, no abnormalities observed, and no abnormalities palpated.   Eyes:  vision grossly intact, pupils equal, pupils round, and pupils reactive to light.   Mouth:  Oral mucosa and oropharynx without lesions or exudates.  Teeth in good repair. Neck:  No deformities, masses, or tenderness noted. Lungs:  normal respiratory effort, no intercostal retractions, no accessory muscle use, normal breath sounds, no dullness, no fremitus, no crackles, and no wheezes.   Heart:  normal rate, regular rhythm, no murmur, no gallop, no rub, and no JVD.   Abdomen:  soft, non-tender, normal bowel sounds, no distention,  no masses, no guarding, no rigidity, no rebound tenderness, no hepatomegaly, and no splenomegaly.   Msk:  No deformity or scoliosis noted of thoracic or lumbar spine.   Pulses:  R and L carotid,radial,femoral,dorsalis pedis and posterior tibial pulses are full and equal bilaterally Extremities:  No clubbing, cyanosis, edema, or deformity noted with normal full range of motion of all joints.   Neurologic:  No cranial nerve deficits noted. Station and gait are normal. Plantar reflexes are down-going bilaterally. DTRs are symmetrical throughout. Sensory, motor and coordinative functions appear intact. Skin:  turgor normal, color normal, no rashes, no suspicious lesions, no ecchymoses, no petechiae, no purpura, no ulcerations, and no edema.   Cervical Nodes:  no anterior cervical adenopathy and no posterior cervical adenopathy.   Axillary Nodes:  no R axillary adenopathy and no L axillary adenopathy.   Inguinal Nodes:  no R inguinal adenopathy and no L inguinal adenopathy.   Psych:  Oriented X3, memory intact for recent and remote, normally interactive, good eye contact, not depressed appearing, not agitated, not suicidal, not homicidal, tearful, and moderately anxious.     Impression & Recommendations:  Problem # 1:  DEPRESSION (ICD-311) Assessment Deteriorated  this may be serotonergic syndrome so I have asked her to stop Nuvigil and Prisitq, and then restart Pristiq in 2-3 days if she is feeling better. she will let me know of any new or worsening symptoms. will refer her to Dr. Evelene Croon (Psych) for ongoing help with antidepressants. Her updated medication list for this problem includes:    Clonazepam 0.5 Mg Tabs (Clonazepam) ..... One by mouth at bedtime prn    Pristiq 50 Mg Xr24h-tab (Desvenlafaxine succinate) ..... One by mouth once daily  Orders: Psychiatric Referral (Psych)  Problem # 2:  HYPOTHYROIDISM (ICD-244.9) Assessment: Unchanged  Her updated medication list for this problem  includes:    Synthroid 100 Mcg Tabs (Levothyroxine sodium) ..... One by mouth once daily  Labs Reviewed: TSH: 1.06 (04/04/2009)    Chol: 193 (06/27/2008)   HDL: 65.50 (06/27/2008)   LDL: DEL (03/15/2008)   TG: 232.0 (06/27/2008)  Problem # 3:  ASTHMA (ICD-493.90) Assessment: Improved  The following medications were removed from the medication list:    Dulera 100-5 Mcg/act Aero (Mometasone furo-formoterol fum) .Marland Kitchen... 2 puffs two times a day  Complete Medication List: 1)  Synthroid 100 Mcg Tabs (Levothyroxine sodium) .... One by mouth once daily 2)  Maxalt 10 Mg Tabs (Rizatriptan benzoate) .... Take 1 tablet by mouth once a day as needed 3)  Clonazepam 0.5 Mg Tabs (Clonazepam) .... One by mouth at bedtime prn 4)  Valtrex 1 Gm  Tabs (Valacyclovir hcl) .... Take 1 tablet by mouth once a day 5)  Lunesta 2 Mg Tabs (Eszopiclone) .... Take 1 tab by mouth at bedtime 6)  Tramadol Hcl 50 Mg Tabs (Tramadol hcl) .Marland Kitchen.. 1-2 by mouth qid as needed for pain 7)  Estradiol 0.1 Mg/24hr Ptwk (Estradiol) 8)  Pristiq 50 Mg Xr24h-tab (Desvenlafaxine succinate) .... One by mouth once daily  Patient Instructions: 1)  Please schedule a follow-up appointment in 1-2 weeks. Prescriptions: PRISTIQ 50 MG XR24H-TAB (DESVENLAFAXINE SUCCINATE) One by mouth once daily  #30 x 11   Entered and Authorized by:   Etta Grandchild MD   Signed by:   Etta Grandchild MD on 05/09/2009   Method used:   Electronically to        [facility] (retail)       [address]       [place], [place]  [address]       Ph: [phone] or [phone]       Fax: [phone]   RxID:   [id number]

## 2010-03-05 NOTE — Assessment & Plan Note (Signed)
Summary: 6 WK ROV/NWS  Medications Added PRAVACHOL 40 MG  TABS (PRAVASTATIN SODIUM) one by mouth qhs        Vital Signs:  Patient Profile:   55 Years Old Female Height:     61 inches Temp:     97.9 degrees F oral Pulse rate:   94 / minute BP sitting:   121 / 83  (left arm)  Vitals Entered By: Glendell Docker (March 09, 2007 2:41 PM)                 Chief Complaint:  Follow discuss lab results and crestor effects.  History of Present Illness:  Hyperlipidemia Follow-Up      This is a 55 year old woman who presents for Hyperlipidemia follow-up.  The patient reports fatigue, but denies muscle aches.  The patient denies the following symptoms: chest pain/pressure.  Compliance with medications (by patient report) has been intermittent.  Dietary compliance has been fair.  The patient reports exercising occasionally.    Current Allergies (reviewed today): ! PCN ! CODEINE  Past Medical History:    Hypothyroidism    Anxiety    Depression    Headache    Nephrolithiasis, hx of    Fibromyalgia   Family History:    Reviewed history from 01/19/2007 and no changes required:       Mother has history of mini strokes and breast cancer.       Father has history of coronary artery disease he is status post CABG         he also has history of hypertension, hyperlipidemia, peripheral vascular disease  Social History:    Reviewed history from 01/19/2007 and no changes required:       Occupation:  Freight forwarder       Married with 4 children       Never Smoked       Alcohol use-no   Risk Factors:  Mammogram History:     Date of Last Mammogram:  12/21/2006    Results:  normal   PAP Smear History:     Date of Last PAP Smear:  12/21/2006    Results:  normal       Impression & Recommendations:  Problem # 1:  DYSLIPIDEMIA (ICD-272.4) Pt stopped  Crestor due to fatigue.  Trial of pravastatin.  Dietary changes reviewed.  I advised patient start fish oil two  times a day.  The following medications were removed from the medication list:    Crestor 10 Mg Tabs (Rosuvastatin calcium) ..... One by mouth qhs  Her updated medication list for this problem includes:    Pravachol 40 Mg Tabs (Pravastatin sodium) ..... One by mouth qhs   Complete Medication List: 1)  Synthroid (levothyroxine Sodium)  .... .01mg  by mouth once daily 2)  Paxil 20 Mg Tabs (Paroxetine hcl) .... Take 1 tablet by mouth once a day 3)  Premarin 0.625 Mg Tabs (Estrogens conjugated) .... Take 1 tablet by mouth once a day 4)  Maxalt 10 Mg Tabs (Rizatriptan benzoate) .... Take 1 tablet by mouth once a day as needed 5)  Lunesta 2 Mg Tabs (Eszopiclone) .... Take 1 tablet by mouth once a day at bedtime as needed 6)  Lyrica 75 Mg Caps (Pregabalin) .... Take 1 tablet by mouth three times a day 7)  Klonopin 0.5 Mg Tabs (Clonazepam) .... 1/2 to one by mouth at bedtime prn 8)  Darvocet-n 100 100-650 Mg Tabs (Propoxyphene n-apap) .... One by  mouth q 12 prn 9)  Eq Omeprazole 20 Mg Tbec (Omeprazole) .... One p.o. daily a.c. 10)  Pravachol 40 Mg Tabs (Pravastatin sodium) .... One by mouth qhs   Patient Instructions: 1)  Please schedule a follow-up appointment in 4 months. 2)  Please return for lab work in 2 months: 3)  Lipid Panel prior to visit, ICD-9: 272.4 4)  AST, ALT:  272.4 5)  CRP:  272.4 6)  Take fish oil capsules two times a day.    Prescriptions: PRAVACHOL 40 MG  TABS (PRAVASTATIN SODIUM) one by mouth qhs  #30 x 5   Entered and Authorized by:   Dondra Spry DO   Signed by:   Dondra Spry DO on 03/09/2007   Method used:   Electronically sent to ...       [facility]*       [address]       [place], [place]  [address]       Ph: [phone] or [phone]       Fax: [phone]   RxID:   [id number]  ]  Preventive Care Screening  Mammogram:    Date:  12/21/2006    Results:  normal   Pap Smear:    Date:  12/21/2006     Results:  normal   Last Flu Shot:    Date:  12/02/2006    Results:  given

## 2010-03-05 NOTE — Progress Notes (Signed)
  Phone Note Call from Patient Call back at [facility] Phone [phone]   Caller: Patient

## 2010-03-05 NOTE — Assessment & Plan Note (Signed)
Summary: LOOSING HAIR /TLJ PT/ $50 /NWS   Vital Signs:  Patient Profile:   55 Years Old Female Height:     61 inches Weight:      132.25 pounds Temp:     98.4 degrees F oral Pulse rate:   93 / minute BP sitting:   100 / 80  (right arm) Cuff size:   regular  Vitals Entered By: Windell Norfolk (March 15, 2008 10:03 AM)                 PCP:  Dondra Spry DO  Chief Complaint:  LOOSING HAIR.  History of Present Illness: hair seems to be falling out rapidly for no apparent reason, seems to have o/w unexplained fatigue for months, just never seemed to bounce back after the surgury oct 2009 - per pt had what sounds like ventral hernia repair per dr bowers/plastic surgury; most recently had sinus infection starting november 2009 - saw dr Artist Pais, better but not completely per pt and now markedly worse in 4 to 5 days with facial pain, pressure, fever and greenish d/c;     Prior Medications Reviewed Using: Patient Recall  Updated Prior Medication List: SYNTHROID 100 MCG TABS (LEVOTHYROXINE SODIUM) one by mouth once daily PAXIL 20 MG  TABS (PAROXETINE HCL) Take 1 tablet by mouth once a day PREMARIN 0.625 MG  TABS (ESTROGENS CONJUGATED) Take 1 tablet by mouth once a day MAXALT 10 MG  TABS (RIZATRIPTAN BENZOATE) Take 1 tablet by mouth once a day as needed CLONAZEPAM 0.5 MG TABS (CLONAZEPAM) 1/2 to one by mouth at bedtime prn DARVOCET-N 100 100-650 MG  TABS (PROPOXYPHENE N-APAP) one by mouth q 12 prn PRAVASTATIN SODIUM 20 MG TABS (PRAVASTATIN SODIUM) one by mouth qpm SAVELLA 50 MG TABS (MILNACIPRAN HCL) one by mouth two times a day AZITHROMYCIN 250 MG TABS (AZITHROMYCIN) 2po qd for 1 day, then 1po qd for 4days, then stop  Current Allergies (reviewed today): ! PCN ! CODEINE ! LYRICA  Past Medical History:    Reviewed history from 12/31/2007 and no changes required:       Hypothyroidism       Anxiety       Depression       Headache       Nephrolithiasis, hx of       Fibromyalgia       Asthma        Hyperlipidemia       Gout  Past Surgical History:    Reviewed history from 12/31/2007 and no changes required:       Breast Reduction 1993       Hysterectomy       Total knee replacement       Appendectomy       Cholecystectomy             Social History:    Reviewed history from 12/31/2007 and no changes required:       Occupation:  Freight forwarder       Married with 4 children       Never Smoked       Alcohol use-no        Regular exercise-yes     Review of Systems       all otherwise negative    Physical Exam  General:     alert and well-developed.   Head:     Normocephalic and atraumatic without obvious abnormalities. No apparent alopecia or balding. Eyes:  No corneal or conjunctival inflammation noted. EOMI. Perrla. Ears:     bilat tm's red, sinus tender bialt Nose:     nasal dischargemucosal pallor and mucosal erythema.   Mouth:     pharyngeal erythema and fair dentition.   Neck:     No deformities, masses, or tenderness noted. Lungs:     Normal respiratory effort, chest expands symmetrically. Lungs are clear to auscultation, no crackles or wheezes. Heart:     normal rate, regular rhythm, and no gallop.   Abdomen:     soft, non-tender, and normal bowel sounds.   Msk:     no joint tenderness and no joint swelling.   Extremities:     no edema, no ulcers  Skin:     no alopecia, ? some mild hair thinning    Impression & Recommendations:  Problem # 1:  SINUSITIS- ACUTE-NOS (ICD-461.9)  The following medications were removed from the medication list:    Doxycycline Hyclate 100 Mg Caps (Doxycycline hyclate) ..... One by mouth bid  Her updated medication list for this problem includes:    Azithromycin 250 Mg Tabs (Azithromycin) .Marland Kitchen... 2po qd for 1 day, then 1po qd for 4days, then stop treat as above, f/u any worsening signs or symptoms   Problem # 2:  FATIGUE (ICD-780.79) exam o/w benign, ok to check routine  labs Orders: TLB-BMP (Basic Metabolic Panel-BMET) (80048-METABOL) TLB-CBC Platelet - w/Differential (85025-CBCD) TLB-B12 + Folate Pnl ([address]_60454-U98/JXB) TLB-Sedimentation Rate (ESR) (85652-ESR) TLB-TSH (Thyroid Stimulating Hormone) (84443-TSH) TLB-Hepatic/Liver Function Pnl (80076-HEPATIC)   Problem # 3:  DYSLIPIDEMIA (ICD-272.4)  Her updated medication list for this problem includes:    Pravastatin Sodium 20 Mg Tabs (Pravastatin sodium) ..... One by mouth qpm  Orders: TLB-Lipid Panel (80061-LIPID) on chronic statin - to chedck lipids, lft's - goal ldl less than 100  Problem # 4:  HAIR LOSS (ICD-704.00) subjective - no patchy alopecia at least - to check TSH as above, conside dermatology eval  Complete Medication List: 1)  Synthroid 100 Mcg Tabs (Levothyroxine sodium) .... One by mouth once daily 2)  Paxil 20 Mg Tabs (Paroxetine hcl) .... Take 1 tablet by mouth once a day 3)  Premarin 0.625 Mg Tabs (Estrogens conjugated) .... Take 1 tablet by mouth once a day 4)  Maxalt 10 Mg Tabs (Rizatriptan benzoate) .... Take 1 tablet by mouth once a day as needed 5)  Clonazepam 0.5 Mg Tabs (Clonazepam) .... 1/2 to one by mouth at bedtime prn 6)  Darvocet-n 100 100-650 Mg Tabs (Propoxyphene n-apap) .... One by mouth q 12 prn 7)  Pravastatin Sodium 20 Mg Tabs (Pravastatin sodium) .... One by mouth qpm 8)  Savella 50 Mg Tabs (Milnacipran hcl) .... One by mouth two times a day 9)  Azithromycin 250 Mg Tabs (Azithromycin) .... 2po qd for 1 day, then 1po qd for 4days, then stop   Patient Instructions: 1)  Please take all new medications as prescribed 2)  Continue all medications that you may have been taking previously  3)  Please go to the Lab in the basement for your blood tests today 4)  Please schedule an appointment with your primary doctor as needed 5)  please consider seeing dermatology if the blood tests are normal and the hair loss persists   Prescriptions: AZITHROMYCIN 250 MG  TABS (AZITHROMYCIN) 2po qd for 1 day, then 1po qd for 4days, then stop  #6 x 1   Entered and Authorized by:   Corwin Levins MD   Signed  by:   Corwin Levins MD on 03/15/2008   Method used:   Print then Give to Patient   RxID:   [id number]

## 2010-03-05 NOTE — Letter (Signed)
Summary: Aundra Dubin (Rheumatology)  Aundra Dubin (Rheumatology)   Imported By: Maryln Gottron 01/05/2007 14:19:21  _____________________________________________________________________  External Attachment:    Type:   Image     Comment:   External Document

## 2010-03-05 NOTE — Progress Notes (Signed)
Summary: med refill  Phone Note Refill Request Message from:  Fax from Pharmacy on September 20, 2008 3:22 PM  Refills Requested: Medication #1:  MAXALT 10 MG  TABS Take 1 tablet by mouth once a day as needed Initial call taken by: Josph Macho, MA,  September 20, 2008 3:22 PM    Prescriptions: MAXALT 10 MG  TABS (RIZATRIPTAN BENZOATE) Take 1 tablet by mouth once a day as needed  #10 x 5   Entered by:   Rock Nephew CMA   Authorized by:   Etta Grandchild MD   Signed by:   Rock Nephew CMA on 09/20/2008   Method used:   Electronically to        [facility] (retail)       [address]       [place], [place]  [address]       Ph: [phone] or [phone]       Fax: [phone]   RxID:   [id number]

## 2010-03-05 NOTE — Letter (Signed)
Summary: Lipid Letter  [facility]  [address] [place], [place] [address]   Phone: [phone]  Fax: [phone]    06/28/2008  Claudia Luna [address] [place], [place]  [address]  Dear Cathey Endow:  We have carefully reviewed your last lipid profile from 03/15/2008 and the results are noted below with a summary of recommendations for lipid management.    Cholesterol:       193     Goal: <200   HDL "good" Cholesterol:   27.06     Goal: >40   LDL "bad" Cholesterol:   112     Goal: <130   Triglycerides:       232.0     Goal: <150    there is too much fat and too many carbs, thus the high triglycerides.    TLC Diet (Therapeutic Lifestyle Change): Saturated Fats & Transfatty acids should be kept < 7% of total calories ***Reduce Saturated Fats Polyunstaurated Fat can be up to 10% of total calories Monounsaturated Fat Fat can be up to 20% of total calories Total Fat should be no greater than 25-35% of total calories Carbohydrates should be 50-60% of total calories Protein should be approximately 15% of total calories Fiber should be at least 20-30 grams a day ***Increased fiber may help lower LDL Total Cholesterol should be < 200mg /day Consider adding plant stanol/sterols to diet (example: Benacol spread) ***A higher intake of unsaturated fat may reduce Triglycerides and Increase HDL    Adjunctive Measures (may lower LIPIDS and reduce risk of Heart Attack) include: Aerobic Exercise (20-30 minutes 3-4 times a week) Limit Alcohol Consumption Weight Reduction Aspirin 75-81 mg a day by mouth (if not allergic or contraindicated) Dietary Fiber 20-30 grams a day by mouth     Current Medications: 1)    Synthroid 100 Mcg Tabs (Levothyroxine sodium) .... One by mouth once daily 2)    Pristiq 50 Mg Xr24h-tab (Desvenlafaxine succinate) .... One q day 3)    Premarin 0.625 Mg  Tabs (Estrogens conjugated) .... Take 1 tablet by mouth once a day 4)    Maxalt 10 Mg  Tabs  (Rizatriptan benzoate) .... Take 1 tablet by mouth once a day as needed 5)    Clonazepam 0.5 Mg Tabs (Clonazepam) .... 1/2 to one by mouth at bedtime prn 6)    Darvocet-n 100 100-650 Mg  Tabs (Propoxyphene n-apap) .... One by mouth qid as needed for pain 7)    Simvastatin 80 Mg Tabs (Simvastatin) .Marland Kitchen.. 1 by mouth once daily 8)    Valtrex 1 Gm Tabs (Valacyclovir hcl) .... Take 1 tablet by mouth once a day 9)    Lunesta 2 Mg Tabs (Eszopiclone) .... Take 1 tab by mouth at bedtime 10)    Nuvigil 150 Mg Tabs (Armodafinil) .... Once daily  If you have any questions, please call. We appreciate being able to work with you.   Sincerely,    [facility] Etta Grandchild MD

## 2010-03-05 NOTE — Progress Notes (Signed)
Summary: darvocet refill  Phone Note Refill Request Message from:  Fax from Pharmacy on November 21, 2008 10:32 AM  Refills Requested: Medication #1:  DARVOCET-N 100 100-650 MG  TABS One by mouth qid as needed for pain   Supply Requested: 1 month   Last Refilled: 10/05/2008 [facility] this ok to fill  Initial call taken by: Rock Nephew CMA,  November 21, 2008 10:32 AM  Follow-up for Phone Call        yes Follow-up by: Etta Grandchild MD,  November 21, 2008 10:44 AM    Prescriptions: DARVOCET-N 100 100-650 MG  TABS (PROPOXYPHENE N-APAP) One by mouth qid as needed for pain  #120 x 3   Entered by:   Rock Nephew CMA   Authorized by:   Etta Grandchild MD   Signed by:   Rock Nephew CMA on 11/21/2008   Method used:   Telephoned to ...       [facility] (retail)       [address]       [place], [place]  [address]       Ph: [phone] or [phone]       Fax: [phone]   RxID:   [id number]

## 2010-03-05 NOTE — Procedures (Signed)
Summary: Colonoscopy   Colonoscopy  Procedure date:  09/15/2008  Findings:      Location:  [facility].    Procedures Next Due Date:    Colonoscopy: 09/2018 COLONOSCOPY PROCEDURE REPORT  PATIENT:  Claudia, Luna  MR#:  [id number] BIRTHDATE:   12/27/1955, 52 yrs. old   GENDER:   female  ENDOSCOPIST:   Barbette Hair. Arlyce Dice, MD Referred by: Etta Grandchild, M.D.  PROCEDURE DATE:  09/15/2008 PROCEDURE:  Colonoscopy, diagnostic ASA CLASS:   Class II INDICATIONS: Routine Risk Screening   MEDICATIONS:    Fentanyl 75 mcg IV, Versed 10 mg IV  DESCRIPTION OF PROCEDURE:   After the risks benefits and alternatives of the procedure were thoroughly explained, informed consent was obtained.  Digital rectal exam was performed and revealed no abnormalities.   The LB [id number] endoscope was introduced through the anus and advanced to the cecum, which was identified by both the appendix and ileocecal valve, without limitations.  The quality of the prep was excellent, using MiraLax.  The instrument was then slowly withdrawn as the colon was fully examined. <<PROCEDUREIMAGES>>                      <<OLD IMAGES>>  FINDINGS:  Internal hemorrhoids were found (see image16).  This was otherwise a normal examination of the colon (see image2, image3, image4, image5, image6, image7, image8, image9, image10, image13, and image15).   Retroflexed views in the rectum revealed no abnormalities.    The scope was then withdrawn from the patient and the procedure completed.  COMPLICATIONS:   None  ENDOSCOPIC IMPRESSION:  1) Internal hemorrhoids  2) Otherwise normal examination RECOMMENDATIONS:  1) Continue current colorectal screening recommendations for "routine risk" patients with a repeat colonoscopy in 10 years.  REPEAT EXAM:   In 10 year(s) for Colonoscopy.   _______________________________ Barbette Hair. Arlyce Dice, MD  CC:

## 2010-03-05 NOTE — Medication Information (Signed)
Summary: [employer] benefits  [employer] benefits   Imported By: Lester [place] 11/12/2009 09:23:43  _____________________________________________________________________  External Attachment:    Type:   Image     Comment:   External Document

## 2010-03-05 NOTE — Assessment & Plan Note (Signed)
Summary: TRANSFER FROM DR.YOO,MED REFILL/$50/PN   Vital Signs:  Patient Profile:   55 Years Old Female Height:     61 inches Weight:      132 pounds Temp:     98.4 degrees F oral Pulse rate:   80 / minute Pulse rhythm:   regular BP sitting:   110 / 72  (left arm) Cuff size:   regular  Vitals Entered By: Rock Nephew CMA (December 28, 2007 9:10 AM)                 PCP:  Dondra Spry DO  Chief Complaint:  new to establish.  History of Present Illness: This is a new pt. to me who is slowly recovering from a sinus infection that began one month ago. She has very slight post-nasal drip that persists.  Acute Visit History:      The patient complains of nasal discharge and sinus problems.  She denies abdominal pain, chest pain, constipation, cough, diarrhea, earache, eye symptoms, fever, genitourinary symptoms, headache, musculoskeletal symptoms, nausea, rash, sore throat, and vomiting.        She complains of nasal congestion.           Prior Medications Reviewed Using: Patient Recall  Prior Medication List:  SYNTHROID 100 MCG TABS (LEVOTHYROXINE SODIUM) one by mouth once daily PAXIL 20 MG  TABS (PAROXETINE HCL) Take 1 tablet by mouth once a day PREMARIN 0.625 MG  TABS (ESTROGENS CONJUGATED) Take 1 tablet by mouth once a day MAXALT 10 MG  TABS (RIZATRIPTAN BENZOATE) Take 1 tablet by mouth once a day as needed CLONAZEPAM 0.5 MG TABS (CLONAZEPAM) 1/2 to one by mouth at bedtime prn DARVOCET-N 100 100-650 MG  TABS (PROPOXYPHENE N-APAP) one by mouth q 12 prn PRAVASTATIN SODIUM 20 MG TABS (PRAVASTATIN SODIUM) one by mouth qpm TUSSIONEX PENNKINETIC ER 8-10 MG/5ML LQCR (CHLORPHENIRAMINE-HYDROCODONE) 5 ml q 12 hrs as needed SAVELLA 50 MG TABS (MILNACIPRAN HCL) one by mouth two times a day FLUCONAZOLE 150 MG TABS (FLUCONAZOLE) Take 1 tablet by mouth once a day x 3 days as directed PREDNISONE 10 MG TABS (PREDNISONE) 3 tabs by mouth once daily x 3 days, 2 tabs by mouth once daily x 3  days, 1 tab by mouth once daily x 3 days   Updated Prior Medication List: SYNTHROID 100 MCG TABS (LEVOTHYROXINE SODIUM) one by mouth once daily PAXIL 20 MG  TABS (PAROXETINE HCL) Take 1 tablet by mouth once a day PREMARIN 0.625 MG  TABS (ESTROGENS CONJUGATED) Take 1 tablet by mouth once a day MAXALT 10 MG  TABS (RIZATRIPTAN BENZOATE) Take 1 tablet by mouth once a day as needed CLONAZEPAM 0.5 MG TABS (CLONAZEPAM) 1/2 to one by mouth at bedtime prn DARVOCET-N 100 100-650 MG  TABS (PROPOXYPHENE N-APAP) one by mouth q 12 prn PRAVASTATIN SODIUM 20 MG TABS (PRAVASTATIN SODIUM) one by mouth qpm TUSSIONEX PENNKINETIC ER 8-10 MG/5ML LQCR (CHLORPHENIRAMINE-HYDROCODONE) 5 ml q 12 hrs as needed SAVELLA 50 MG TABS (MILNACIPRAN HCL) one by mouth two times a day FLUCONAZOLE 150 MG TABS (FLUCONAZOLE) Take 1 tablet by mouth once a day x 3 days as directed PREDNISONE 10 MG TABS (PREDNISONE) 3 tabs by mouth once daily x 3 days, 2 tabs by mouth once daily x 3 days, 1 tab by mouth once daily x 3 days  Current Allergies (reviewed today): ! PCN ! CODEINE ! LYRICA  Past Medical History:    Reviewed history from 12/23/2007 and no changes required:  Hypothyroidism       Anxiety       Depression       Headache       Nephrolithiasis, hx of       Fibromyalgia        Asthma  Past Surgical History:    Reviewed history from 12/23/2007 and no changes required:       Breast Reduction 1993       Hysterectomy       Total knee replacement       Appendectomy       Cholecystectomy            Family History:    Reviewed history from 01/19/2007 and no changes required:       Mother has history of mini strokes and breast cancer.       Father has history of coronary artery disease he is status post CABG         he also has history of hypertension, hyperlipidemia, peripheral vascular disease  Social History:    Reviewed history from 12/23/2007 and no changes required:       Occupation:  Games developer       Married with 4 children       Never Smoked       Alcohol use-no        Regular exercise-yes   Risk Factors:  Tobacco use:  never Passive smoke exposure:  no Drug use:  no HIV high-risk behavior:  no Alcohol use:  no Exercise:  yes  Family History Risk Factors:    Family History of MI in females < 62 years old:  no    Family History of MI in males < 84 years old:  no  Colonoscopy History:    Date of Last Colonoscopy:  01/20/2001  Mammogram History:    Date of Last Mammogram:  12/21/2006  PAP Smear History:    Date of Last PAP Smear:  12/21/2006   Review of Systems  The patient denies anorexia, fever, weight gain, syncope, dyspnea on exertion, peripheral edema, prolonged cough, headaches, hemoptysis, abdominal pain, hematuria, depression, abnormal bleeding, enlarged lymph nodes, and angioedema.     Physical Exam  General:     alert, well-developed, well-nourished, well-hydrated, appropriate dress, normal appearance, healthy-appearing, cooperative to examination, and good hygiene.   Eyes:     No corneal or conjunctival inflammation noted. EOMI. Perrla. Funduscopic exam benign, without hemorrhages, exudates or papilledema. Vision grossly normal. Ears:     External ear exam shows no significant lesions or deformities.  Otoscopic examination reveals clear canals, tympanic membranes are intact bilaterally without bulging, retraction, inflammation or discharge. Hearing is grossly normal bilaterally. Mouth:     Oral mucosa and oropharynx without lesions or exudates.  Teeth in good repair. Neck:     supple, full ROM, no masses, and no thyromegaly.   Lungs:     Normal respiratory effort, chest expands symmetrically. Lungs are clear to auscultation, no crackles or wheezes. Heart:     Normal rate and regular rhythm. S1 and S2 normal without gallop, murmur, click, rub or other extra sounds. Abdomen:     soft, non-tender, normal bowel sounds, no hepatomegaly, and  no splenomegaly.   Skin:     turgor normal, color normal, no rashes, no ulcerations, and no edema.   Cervical Nodes:     No lymphadenopathy noted Psych:     Cognition and judgment appear intact. Alert and cooperative with normal attention span  and concentration. No apparent delusions, illusions, hallucinations    Impression & Recommendations:  Problem # 1:  ASTHMA UNSPECIFIED WITH EXACERBATION (ICD-493.92) Assessment: Improved  Her updated medication list for this problem includes:    Prednisone 10 Mg Tabs (Prednisone) .Marland KitchenMarland KitchenMarland KitchenMarland Kitchen 3 tabs by mouth once daily x 3 days, 2 tabs by mouth once daily x 3 days, 1 tab by mouth once daily x 3 days   Problem # 2:  RHINOSINUSITIS, ACUTE (ICD-461.8) Assessment: Improved  Her updated medication list for this problem includes:    Tussionex Pennkinetic Er 8-10 Mg/63ml Lqcr (Chlorpheniramine-hydrocodone) .Marland KitchenMarland KitchenMarland KitchenMarland Kitchen 5 ml q 12 hrs as needed   Complete Medication List: 1)  Synthroid 100 Mcg Tabs (Levothyroxine sodium) .... One by mouth once daily 2)  Paxil 20 Mg Tabs (Paroxetine hcl) .... Take 1 tablet by mouth once a day 3)  Premarin 0.625 Mg Tabs (Estrogens conjugated) .... Take 1 tablet by mouth once a day 4)  Maxalt 10 Mg Tabs (Rizatriptan benzoate) .... Take 1 tablet by mouth once a day as needed 5)  Clonazepam 0.5 Mg Tabs (Clonazepam) .... 1/2 to one by mouth at bedtime prn 6)  Darvocet-n 100 100-650 Mg Tabs (Propoxyphene n-apap) .... One by mouth q 12 prn 7)  Pravastatin Sodium 20 Mg Tabs (Pravastatin sodium) .... One by mouth qpm 8)  Tussionex Pennkinetic Er 8-10 Mg/42ml Lqcr (Chlorpheniramine-hydrocodone) .... 5 ml q 12 hrs as needed 9)  Savella 50 Mg Tabs (Milnacipran hcl) .... One by mouth two times a day 10)  Fluconazole 150 Mg Tabs (Fluconazole) .... Take 1 tablet by mouth once a day x 3 days as directed 11)  Prednisone 10 Mg Tabs (Prednisone) .... 3 tabs by mouth once daily x 3 days, 2 tabs by mouth once daily x 3 days, 1 tab by mouth once daily x  3 days  Other Orders: Admin 1st Vaccine (98119) Flu Vaccine 58yrs + (14782) Pneumococcal Vaccine (95621) Admin of Any Addtl Vaccine (30865)   Patient Instructions: 1)  Please schedule a follow-up appointment as needed. 2)  It is important that you exercise regularly at least 20 minutes 5 times a week. If you develop chest pain, have severe difficulty breathing, or feel very tired , stop exercising immediately and seek medical attention.   ]  Pneumovax Vaccine    Vaccine Type: Pneumovax    Site: left deltoid    Mfr: Merck    Dose: 0.5 ml    Route: IM    Given by: Rock Nephew CMA    Exp. Date: 02/23/2009    Lot #: [id number]    VIS given: 09/01/95 version given December 28, 2007.  Flu Vaccine Consent Questions     Do you have a history of severe allergic reactions to this vaccine? no    Any prior history of allergic reactions to egg and/or gelatin? no    Do you have a sensitivity to the preservative Thimersol? no    Do you have a past history of Guillan-Barre Syndrome? no    Do you currently have an acute febrile illness? no    Have you ever had a severe reaction to latex? no    Vaccine information given and explained to patient? yes    Are you currently pregnant? no    Lot Number:[id number]   Exp Date:08/02/2008   Site Given  Right Deltoid IM   Appended Document: TRANSFER FROM DR.YOO,MED REFILL/$50/PN As billing provider, I have reviewed this document.

## 2010-03-05 NOTE — Assessment & Plan Note (Signed)
Summary: FU  $50  STC   Vital Signs:  Patient profile:   55 year old female Height:      61 inches Weight:      131 pounds O2 Sat:      94 % Temp:     97.8 degrees F oral Pulse rate:   85 / minute Pulse rhythm:   regular Resp:     16 per minute BP sitting:   104 / 66  Vitals Entered By: Rock Nephew CMA (Jun 27, 2008 11:15 AM)  Primary Care Provider:  Etta Grandchild MD   History of Present Illness: She returns today for followup. She is concerned that she may be having an exacerbation of fibromyalgia and chronic fatigue. She has been sleeping up to 14 hours a day. She's had a slight increase in anxiety and depression and wants to  change Paxil to Pristiq. 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   She wants me to check her ears today because she has persistent popping and pressure in both ears which is fat in the past with eustachian tube dysfunction. She does not want to do nasal steroids.  She is fasting today wants to recheck her cholesterol levels. She has been on simvastatin for about 3 months and she reports no side effects from it such as myalgias or abdominal pain.   She states her last colonoscopy was about 5 years ago and she thinks her results are normal. She was to get a colonoscopy done.  She tripped over a dog gate in her house about a week ago and has healing bruises abrasions and swelling on her lower extremities more on the right than the left.  Depression History:      The patient comes in today for recurrence of depressive symptoms.  She notes that the symptoms started approximately 06/13/1998.  The patient is having a depressed mood most of the day and has a diminished interest in her usual daily activities.  Positive alarm features for depression include significant weight loss, hypersomnia, psychomotor retardation, fatigue (loss of energy), and feelings of worthlessness (guilt).   However, she denies significant weight gain, insomnia, psychomotor agitation, impaired concentration (indecisiveness), and recurrent thoughts of death or suicide.  The patient denies symptoms of a manic disorder including persistently & abnormally elevated mood, abnormally & persistently irritable mood, less need for sleep, talkative or feels need to keep talking, distractibility, flight of ideas, increase in goal-directed activity, psychomotor agitation, inflated self-esteem or grandiosity, excessive buying sprees, excessive sexual indiscretions, and excessive foolish business investments.        The patient denies that she feels like life is not worth living, denies that she wishes that she were dead, and denies that she has thought about ending her life.         Depression Treatment History:  Prior Medication Used:   Start Date: Assessment of Effect:   Comments:  Paxil (paroxetine)     05/26/2001   some improvement     --  Lipid Management History:      Negative NCEP/ATP III risk factors include female age less than 44 years old, HDL cholesterol greater than 60, no family history for ischemic heart disease, and non-tobacco-user status.        The patient states that she knows about the "Therapeutic Lifestyle Change" diet.  Her compliance with the TLC diet is fair.  The patient expresses  understanding of adjunctive measures for cholesterol lowering.  Adjunctive measures started by the patient include limit alcohol consumpton and weight reduction.  She expresses no side effects from her lipid-lowering medication.  The patient denies any symptoms to suggest myopathy or liver disease.      Preventive Screening-Counseling & Management     Alcohol drinks/day: 0     Smoking Status: never  Allergies (verified): 1)  ! Pcn 2)  ! Codeine 3)  ! Lyrica  Past History:  Past Medical History:    Reviewed history from 03/15/2008 and no changes required:    Hypothyroidism    Anxiety    Depression     Headache    Nephrolithiasis, hx of    Fibromyalgia     Asthma     Hyperlipidemia    Gout  Past Surgical History:    Reviewed history from 12/31/2007 and no changes required:    Breast Reduction 1993    Hysterectomy    Total knee replacement    Appendectomy    Cholecystectomy         Family History:    Reviewed history from 01/19/2007 and no changes required:       Mother has history of mini strokes and breast cancer.       Father has history of coronary artery disease he is status post CABG         he also has history of hypertension, hyperlipidemia, peripheral vascular disease  Social History:    Reviewed history from 12/31/2007 and no changes required:       Occupation:  Freight forwarder       Married with 4 children       Never Smoked       Alcohol use-no        Regular exercise-yes   Review of Systems       The patient complains of weight gain and depression.  The patient denies anorexia, fever, weight loss, chest pain, peripheral edema, prolonged cough, headaches, hemoptysis, abdominal pain, melena, hematochezia, severe indigestion/heartburn, hematuria, and incontinence.    Physical Exam  General:  alert, well-developed, well-nourished, well-hydrated, appropriate dress, normal appearance, healthy-appearing, cooperative to examination, and good hygiene.   Head:  normocephalic and atraumatic.   Eyes:  No corneal or conjunctival inflammation noted. EOMI. Perrla. Funduscopic exam benign, without hemorrhages, exudates or papilledema. Vision grossly normal. Ears:  External ear exam shows no significant lesions or deformities.  Otoscopic examination reveals clear canals, tympanic membranes are intact bilaterally without bulging, retraction, inflammation or discharge. Hearing is grossly normal bilaterally. Nose:  External nasal examination shows no deformity or inflammation. Nasal mucosa are pink and moist without lesions or exudates. Mouth:  Oral mucosa and oropharynx  without lesions or exudates.  Teeth in good repair. Neck:  supple, full ROM, no masses, normal carotid upstroke, no carotid bruits, no cervical lymphadenopathy, and no neck tenderness.   Lungs:  Normal respiratory effort, chest expands symmetrically. Lungs are clear to auscultation, no crackles or wheezes. Heart:  Normal rate and regular rhythm. S1 and S2 normal without gallop, murmur, click, rub or other extra sounds. Abdomen:  soft, non-tender, normal bowel sounds, no distention, no masses, no guarding, no rigidity, no rebound tenderness, no hepatomegaly, and no splenomegaly.   Msk:  she has a healing injury on her right and left lower extremities. Over the left knee there is a very small ecchymosis. Over the right knee and extending distally over the anterior lower leg there is an  ecchymosis and abrasion and diffuse swelling. None of the areas show any tenderness to palpation, bony derangements or limited range of motion. She is fully weightbearing on both lower extremities with no limitations Pulses:  R and L carotid,radial,femoral,dorsalis pedis and posterior tibial pulses are full and equal bilaterally Extremities:  No clubbing, cyanosis, edema, or deformity noted with normal full range of motion of all joints.   Neurologic:  No cranial nerve deficits noted. Station and gait are normal. Plantar reflexes are down-going bilaterally. DTRs are symmetrical throughout. Sensory, motor and coordinative functions appear intact. Skin:  turgor normal, color normal, no rashes, and no suspicious lesions.   Cervical Nodes:  No lymphadenopathy noted Psych:  Cognition and judgment appear intact. Alert and cooperative with normal attention span and concentration. No apparent delusions, illusions, hallucinations   Impression & Recommendations:  Problem # 1:  ROUTINE GENERAL MEDICAL EXAM@HEALTH  CARE FACL (ICD-V70.0)  Orders: Gastroenterology Referral (GI)  Problem # 2:  DEPRESSION (ICD-311) Assessment:  Deteriorated  Her updated medication list for this problem includes:    Pristiq 50 Mg Xr24h-tab (Desvenlafaxine succinate) ..... One q day    Clonazepam 0.5 Mg Tabs (Clonazepam) .Marland Kitchen... 1/2 to one by mouth at bedtime prn  Problem # 3:  FIBROMYALGIA (ICD-729.1) Assessment: Deteriorated  Her updated medication list for this problem includes:    Darvocet-n 100 100-650 Mg Tabs (Propoxyphene n-apap) ..... One by mouth qid as needed for pain  Orders: TLB-Lipid Panel (80061-LIPID) TLB-Hepatic/Liver Function Pnl (80076-HEPATIC) TLB-TSH (Thyroid Stimulating Hormone) (84443-TSH) TLB-CK Total Only(Creatine Kinase/CPK) (82550-CK)  Problem # 4:  DYSLIPIDEMIA (ICD-272.4) Assessment: Improved  Her updated medication list for this problem includes:    Simvastatin 80 Mg Tabs (Simvastatin) .Marland Kitchen... 1 by mouth once daily  Orders: TLB-Lipid Panel (80061-LIPID) TLB-Hepatic/Liver Function Pnl (80076-HEPATIC) TLB-TSH (Thyroid Stimulating Hormone) (84443-TSH) TLB-CK Total Only(Creatine Kinase/CPK) (82550-CK)  Problem # 5:  INJURY, LEG (ICD-916.8) Assessment: New  Problem # 6:  HYPERSOMNIA, IDIOPATHIC (ICD-780.54) continue nuvigil.  Complete Medication List: 1)  Synthroid 100 Mcg Tabs (Levothyroxine sodium) .... One by mouth once daily 2)  Pristiq 50 Mg Xr24h-tab (Desvenlafaxine succinate) .... One q day 3)  Premarin 0.625 Mg Tabs (Estrogens conjugated) .... Take 1 tablet by mouth once a day 4)  Maxalt 10 Mg Tabs (Rizatriptan benzoate) .... Take 1 tablet by mouth once a day as needed 5)  Clonazepam 0.5 Mg Tabs (Clonazepam) .... 1/2 to one by mouth at bedtime prn 6)  Darvocet-n 100 100-650 Mg Tabs (Propoxyphene n-apap) .... One by mouth qid as needed for pain 7)  Simvastatin 80 Mg Tabs (Simvastatin) .Marland Kitchen.. 1 by mouth once daily 8)  Valtrex 1 Gm Tabs (Valacyclovir hcl) .... Take 1 tablet by mouth once a day 9)  Lunesta 2 Mg Tabs (Eszopiclone) .... Take 1 tab by mouth at bedtime 10)  Nuvigil 150 Mg Tabs  (Armodafinil) .... Once daily  Lipid Assessment/Plan:      Based on NCEP/ATP III, the patient's risk factor category is "0-1 risk factors".  The patient's lipid goals are as follows: Total cholesterol goal is 200; LDL cholesterol goal is 160; HDL cholesterol goal is 40; Triglyceride goal is 150.    Patient Instructions: 1)  Please schedule a follow-up appointment in 1 month. 2)  It is important that you exercise regularly at least 20 minutes 5 times a week. If you develop chest pain, have severe difficulty breathing, or feel very tired , stop exercising immediately and seek medical attention. 3)  You need to lose weight.  Consider a lower calorie diet and regular exercise.  Prescriptions: DARVOCET-N 100 100-650 MG  TABS (PROPOXYPHENE N-APAP) One by mouth qid as needed for pain  #120 x 2   Entered and Authorized by:   Etta Grandchild MD   Signed by:   Etta Grandchild MD on 06/27/2008   Method used:   Print then Give to Patient   RxID:   [id number] NUVIGIL 150 MG TABS (ARMODAFINIL) once daily  #30 x 2   Entered and Authorized by:   Etta Grandchild MD   Signed by:   Etta Grandchild MD on 06/27/2008   Method used:   Print then Give to Patient   RxID:   [id number] PRISTIQ 50 MG XR24H-TAB (DESVENLAFAXINE SUCCINATE) one q day  #28 x 0   Entered and Authorized by:   Etta Grandchild MD   Signed by:   Etta Grandchild MD on 06/27/2008   Method used:   Historical   RxID:   [id number]

## 2010-03-05 NOTE — Progress Notes (Signed)
Summary: clonazepam  Phone Note Refill Request Message from:  Fax from Pharmacy on Jun 07, 2009 2:09 PM  Refills Requested: Medication #1:  CLONAZEPAM 0.5 MG TABS one by mouth at bedtime prn   Last Refilled: 04/19/2009  Is this ok to fill for pt  Initial call taken by: Rock Nephew CMA,  Jun 07, 2009 2:09 PM  Follow-up for Phone Call        yes Follow-up by: Etta Grandchild MD,  Jun 07, 2009 2:18 PM    Prescriptions: CLONAZEPAM 0.5 MG TABS (CLONAZEPAM) one by mouth at bedtime prn  #30 x 3   Entered by:   Rock Nephew CMA   Authorized by:   Etta Grandchild MD   Signed by:   Rock Nephew CMA on 06/08/2009   Method used:   Telephoned to ...       [facility] (retail)       [address]       [place], [place]  [address]       Ph: [phone] or [phone]       Fax: [phone]   RxID:   [id number]

## 2010-03-05 NOTE — Assessment & Plan Note (Signed)
Summary: nose runny--cold symptoms--stc   Vital Signs:  Patient profile:   55 year old female Height:      61 inches Weight:      122 pounds BMI:     23.14 O2 Sat:      96 % on Room air Temp:     98.7 degrees F oral Pulse rate:   75 / minute Pulse rhythm:   regular Resp:     16 per minute BP sitting:   100 / 64  (left arm) Cuff size:   regular  Vitals Entered By: Rock Nephew CMA (February 22, 2009 2:11 PM)  O2 Flow:  Room air CC: discuss medication, runny nose, URI symptoms, Depression   Primary Care Provider:  Etta Grandchild MD  CC:  discuss medication, runny nose, URI symptoms, and Depression.  History of Present Illness:  URI Symptoms      This is a 55 year old woman who presents with URI symptoms.  The symptoms began 3 weeks ago.  The severity is described as moderate.  The patient reports purulent nasal discharge, sore throat, productive cough, earache, and sick contacts.  The patient denies fever, stiff neck, dyspnea, wheezing, rash, vomiting, diarrhea, and use of an antipyretic.  The patient also reports severe fatigue.  The patient denies headache and muscle aches.  Risk factors for Strep sinusitis include unilateral facial pain, unilateral nasal discharge, poor response to decongestant, and double sickening.    Depression History:      The patient is having a depressed mood most of the day and has a diminished interest in her usual daily activities.  Positive alarm features for depression include insomnia, psychomotor retardation, fatigue (loss of energy), feelings of worthlessness (guilt), and impaired concentration (indecisiveness).  However, she denies significant weight loss, significant weight gain, psychomotor agitation, and recurrent thoughts of death or suicide.  The patient denies symptoms of a manic disorder including persistently & abnormally elevated mood, abnormally & persistently irritable mood, less need for sleep, talkative or feels need to keep talking,  distractibility, flight of ideas, increase in goal-directed activity, psychomotor agitation, inflated self-esteem or grandiosity, excessive buying sprees, excessive sexual indiscretions, and excessive foolish business investments.        Risk factors for depression include a personal history of depression.  The patient denies that she feels like life is not worth living, denies that she wishes that she were dead, and denies that she has thought about ending her life.         Depression Treatment History:  Prior Medication Used:   Start Date: Assessment of Effect:   Comments:  Paxil (paroxetine)     05/26/2001   some improvement     --   Preventive Screening-Counseling & Management  Alcohol-Tobacco     Alcohol drinks/day: 0     Smoking Status: never     Passive Smoke Exposure: no  Hep-HIV-STD-Contraception     HIV Risk: no      Drug Use:  no.    Medications Prior to Update: 1)  Synthroid 100 Mcg Tabs (Levothyroxine Sodium) .... One By Mouth Once Daily 2)  Pristiq 50 Mg Xr24h-Tab (Desvenlafaxine Succinate) .... One Q Day 3)  Premarin 0.625 Mg  Tabs (Estrogens Conjugated) .... Take 1 Tablet By Mouth Once A Day 4)  Maxalt 10 Mg  Tabs (Rizatriptan Benzoate) .... Take 1 Tablet By Mouth Once A Day As Needed 5)  Clonazepam 0.5 Mg Tabs (Clonazepam) .... 1/2 To One By Mouth At  Bedtime Prn 6)  Simvastatin 80 Mg Tabs (Simvastatin) .Marland Kitchen.. 1 By Mouth Once Daily 7)  Valtrex 1 Gm Tabs (Valacyclovir Hcl) .... Take 1 Tablet By Mouth Once A Day 8)  Lunesta 2 Mg Tabs (Eszopiclone) .... Take 1 Tab By Mouth At Bedtime 9)  Nuvigil 150 Mg Tabs (Armodafinil) .... Once Daily 10)  Tramadol Hcl 50 Mg Tabs (Tramadol Hcl) .Marland Kitchen.. 1-2 By Mouth Qid As Needed For Pain  Current Medications (verified): 1)  Synthroid 100 Mcg Tabs (Levothyroxine Sodium) .... One By Mouth Once Daily 2)  Premarin 0.625 Mg  Tabs (Estrogens Conjugated) .... Take 1 Tablet By Mouth Once A Day 3)  Maxalt 10 Mg  Tabs (Rizatriptan Benzoate) ....  Take 1 Tablet By Mouth Once A Day As Needed 4)  Clonazepam 0.5 Mg Tabs (Clonazepam) .... 1/2 To One By Mouth At Bedtime Prn 5)  Valtrex 1 Gm Tabs (Valacyclovir Hcl) .... Take 1 Tablet By Mouth Once A Day 6)  Lunesta 2 Mg Tabs (Eszopiclone) .... Take 1 Tab By Mouth At Bedtime 7)  Nuvigil 150 Mg Tabs (Armodafinil) .... Once Daily 8)  Tramadol Hcl 50 Mg Tabs (Tramadol Hcl) .Marland Kitchen.. 1-2 By Mouth Qid As Needed For Pain 9)  Estradiol 0.1 Mg/24hr Ptwk (Estradiol) 10)  Pristiq 100 Mg Xr24h-Tab (Desvenlafaxine Succinate) .... One By Mouth Once Daily 11)  Avelox 400 Mg Tabs (Moxifloxacin Hcl) .... One By Mouth Once Daily For 7 Days  Allergies (verified): 1)  ! Pcn 2)  ! Codeine 3)  ! Lyrica  Past History:  Past Medical History: Reviewed history from 03/15/2008 and no changes required. Hypothyroidism Anxiety Depression Headache Nephrolithiasis, hx of Fibromyalgia  Asthma  Hyperlipidemia Gout  Past Surgical History: Reviewed history from 12/31/2007 and no changes required. Breast Reduction 1993 Hysterectomy Total knee replacement Appendectomy Cholecystectomy      Family History: Reviewed history from 01/19/2007 and no changes required. Mother has history of mini strokes and breast cancer. Father has history of coronary artery disease he is status post CABG   he also has history of hypertension, hyperlipidemia, peripheral vascular disease  Social History: Reviewed history from 12/31/2007 and no changes required. Occupation:  Freight forwarder Married with 4 children Never Smoked Alcohol use-no  Regular exercise-yes   Review of Systems       The patient complains of prolonged cough and depression.  The patient denies anorexia, fever, weight loss, weight gain, chest pain, headaches, hemoptysis, abdominal pain, hematuria, suspicious skin lesions, enlarged lymph nodes, and angioedema.   MS:  Complains of joint pain and muscle aches; denies joint redness, joint swelling,  loss of strength, low back pain, muscle weakness, and stiffness. Psych:  Complains of depression and easily tearful; denies irritability, panic attacks, sense of great danger, suicidal thoughts/plans, thoughts of violence, unusual visions or sounds, and thoughts /plans of harming others.  Physical Exam  General:  alert, well-developed, well-nourished, well-hydrated, appropriate dress, normal appearance, healthy-appearing, cooperative to examination, and good hygiene.   Head:  normocephalic, atraumatic, no abnormalities observed, and no abnormalities palpated.   Ears:  R ear normal and L ear normal.   Nose:  no airflow obstruction, no nasal polyps, no mucosal friability, no active bleeding or clots, nasal dischargemucosal pallor, mucosal edema, L maxillary sinus tenderness, and R maxillary sinus tenderness.   Mouth:  Oral mucosa and oropharynx without lesions or exudates.  Teeth in good repair. Neck:  supple, full ROM, no masses, no thyromegaly, no thyroid nodules or tenderness, no cervical lymphadenopathy, and no  neck tenderness.   Lungs:  normal respiratory effort, no intercostal retractions, no accessory muscle use, normal breath sounds, no dullness, no fremitus, no crackles, and no wheezes.   Heart:  normal rate, regular rhythm, no murmur, no gallop, no rub, and no JVD.   Abdomen:  soft, non-tender, normal bowel sounds, no distention, no masses, no guarding, no rigidity, no hepatomegaly, and no splenomegaly.   Msk:  No deformity or scoliosis noted of thoracic or lumbar spine.   Pulses:  R and L carotid,radial,femoral,dorsalis pedis and posterior tibial pulses are full and equal bilaterally Extremities:  No clubbing, cyanosis, edema, or deformity noted with normal full range of motion of all joints.   Neurologic:  No cranial nerve deficits noted. Station and gait are normal. Plantar reflexes are down-going bilaterally. DTRs are symmetrical throughout. Sensory, motor and coordinative functions  appear intact. Skin:  Intact without suspicious lesions or rashes Cervical Nodes:  no anterior cervical adenopathy and no posterior cervical adenopathy.   Axillary Nodes:  no R axillary adenopathy and no L axillary adenopathy.   Psych:  Oriented X3, memory intact for recent and remote, normally interactive, good eye contact, not anxious appearing, not depressed appearing, and not agitated.     Impression & Recommendations:  Problem # 1:  COUGH (ICD-786.2) Assessment New  Orders: T-2 View CXR (71020TC)  Problem # 2:  BRONCHITIS-ACUTE (ICD-466.0) Assessment: New  Her updated medication list for this problem includes:    Avelox 400 Mg Tabs (Moxifloxacin hcl) ..... One by mouth once daily for 7 days  Take antibiotics and other medications as directed. Encouraged to push clear liquids, get enough rest, and take acetaminophen as needed. To be seen in 5-7 days if no improvement, sooner if worse.  Problem # 3:  HYPERSOMNIA, IDIOPATHIC (ICD-780.54) Assessment: Unchanged continue Nuvigil  Problem # 4:  DEPRESSION (ICD-311) Assessment: Deteriorated will increase dose of Pristiq The following medications were removed from the medication list:    Pristiq 50 Mg Xr24h-tab (Desvenlafaxine succinate) ..... One q day Her updated medication list for this problem includes:    Clonazepam 0.5 Mg Tabs (Clonazepam) .Marland Kitchen... 1/2 to one by mouth at bedtime prn    Pristiq 100 Mg Xr24h-tab (Desvenlafaxine succinate) ..... One by mouth once daily  Problem # 5:  FIBROMYALGIA (ICD-729.1) Assessment: Deteriorated  Her updated medication list for this problem includes:    Tramadol Hcl 50 Mg Tabs (Tramadol hcl) .Marland Kitchen... 1-2 by mouth qid as needed for pain  Complete Medication List: 1)  Synthroid 100 Mcg Tabs (Levothyroxine sodium) .... One by mouth once daily 2)  Premarin 0.625 Mg Tabs (Estrogens conjugated) .... Take 1 tablet by mouth once a day 3)  Maxalt 10 Mg Tabs (Rizatriptan benzoate) .... Take 1 tablet  by mouth once a day as needed 4)  Clonazepam 0.5 Mg Tabs (Clonazepam) .... 1/2 to one by mouth at bedtime prn 5)  Valtrex 1 Gm Tabs (Valacyclovir hcl) .... Take 1 tablet by mouth once a day 6)  Lunesta 2 Mg Tabs (Eszopiclone) .... Take 1 tab by mouth at bedtime 7)  Nuvigil 150 Mg Tabs (Armodafinil) .... Once daily 8)  Tramadol Hcl 50 Mg Tabs (Tramadol hcl) .Marland Kitchen.. 1-2 by mouth qid as needed for pain 9)  Estradiol 0.1 Mg/24hr Ptwk (Estradiol) 10)  Pristiq 100 Mg Xr24h-tab (Desvenlafaxine succinate) .... One by mouth once daily 11)  Avelox 400 Mg Tabs (Moxifloxacin hcl) .... One by mouth once daily for 7 days  Patient Instructions: 1)  Please schedule  a follow-up appointment in 2 weeks. 2)  Take your antibiotic as prescribed until ALL of it is gone, but stop if you develop a rash or swelling and contact our office as soon as possible. 3)  Acute bronchitis symptoms for less than 10 days are not helped by antibiotics. take over the counter cough medications. call if no improvment in  5-7 days, sooner if increasing cough, fever, or new symptoms( shortness of breath, chest pain). Prescriptions: AVELOX 400 MG TABS (MOXIFLOXACIN HCL) One by mouth once daily for 7 days  #7 x 1   Entered and Authorized by:   Etta Grandchild MD   Signed by:   Etta Grandchild MD on 02/22/2009   Method used:   Electronically to        [facility] (retail)       [address]       [place], [place]  [address]       Ph: [phone] or [phone]       Fax: [phone]   RxID:   [id number] PRISTIQ 100 MG XR24H-TAB (DESVENLAFAXINE SUCCINATE) one by mouth once daily  #30 x 11   Entered and Authorized by:   Etta Grandchild MD   Signed by:   Etta Grandchild MD on 02/22/2009   Method used:   Electronically to        [facility]* (retail)       [address]       [place], [place]  [address]       Ph: [phone] or [phone]       Fax:  [phone]   RxID:   [id number] TRAMADOL HCL 50 MG TABS (TRAMADOL HCL) 1-2 by mouth QID as needed for pain  #120 x 9   Entered and Authorized by:   Etta Grandchild MD   Signed by:   Etta Grandchild MD on 02/22/2009   Method used:   Electronically to        [facility] (retail)       [address]       [place], [place]  [address]       Ph: [phone] or [phone]       Fax: [phone]   RxID:   [id number] NUVIGIL 150 MG TABS (ARMODAFINIL) once daily  #30 x 5   Entered and Authorized by:   Etta Grandchild MD   Signed by:   Etta Grandchild MD on 02/22/2009   Method used:   Print then Give to Patient   RxID:   [id number]

## 2010-03-05 NOTE — Progress Notes (Signed)
Summary: recall  Phone Note From Pharmacy   Caller: [facility] [phone]* Summary of Call: Per pharmacy darvocet rx need to be substituted with a new medication due to recall. Please advise Initial call taken by: Rock Nephew CMA,  December 26, 2008 8:30 AM  Follow-up for Phone Call        done Follow-up by: Etta Grandchild MD,  December 26, 2008 8:34 AM    New/Updated Medications: TRAMADOL HCL 50 MG TABS (TRAMADOL HCL) 1-2 by mouth QID as needed for pain Prescriptions: TRAMADOL HCL 50 MG TABS (TRAMADOL HCL) 1-2 by mouth QID as needed for pain  #100 x 9   Entered and Authorized by:   Etta Grandchild MD   Signed by:   Etta Grandchild MD on 12/26/2008   Method used:   Electronically to        [facility] (retail)       [address]       [place], [place]  [address]       Ph: [phone] or [phone]       Fax: [phone]   RxID:   [id number]   Appended Document: recall LOV[facility] to notify patient/la

## 2010-03-06 ENCOUNTER — Encounter: Payer: Self-pay | Admitting: Internal Medicine

## 2010-03-07 NOTE — Progress Notes (Signed)
  Phone Note Call from Patient   Caller: Patient Summary of Call: Patient called [facility] requesting name of MD that was recommended by Dr Yetta Barre. Returned call to patient Dr Evelene Croon and provided phone number.Alvy Beal Archie CMA  February 14, 2010 9:45 AM

## 2010-03-08 ENCOUNTER — Telehealth: Payer: Self-pay | Admitting: Internal Medicine

## 2010-03-13 NOTE — Progress Notes (Signed)
  Phone Note Refill Request Message from:  Fax from Pharmacy on March 08, 2010 9:26 AM  Refills Requested: Medication #1:  TRAMADOL HCL 50 MG TABS 2 tabs every 4-6hrs as needed   Last Refilled: 02/06/2010  Is this ok to refill  Initial call taken by: Rock Nephew CMA,  March 08, 2010 9:26 AM  Follow-up for Phone Call        yes Follow-up by: Etta Grandchild MD,  March 08, 2010 9:31 AM    Prescriptions: TRAMADOL HCL 50 MG TABS (TRAMADOL HCL) 2 tabs every 4-6hrs as needed  #120 x 6   Entered by:   Rock Nephew CMA   Authorized by:   Etta Grandchild MD   Signed by:   Rock Nephew CMA on 03/08/2010   Method used:   Electronically to        [id number]* (retail)       [address]       [place], [place]  [address]       Ph: [phone]       Fax: [phone]   RxID:   [id number]

## 2010-04-02 NOTE — Letter (Signed)
Summary: Veverly Fells Altheimer MD  Veverly Fells Altheimer MD   Imported By: Lester Collier 03/21/2010 16:10:96  _____________________________________________________________________  External Attachment:    Type:   Image     Comment:   External Document

## 2010-05-07 ENCOUNTER — Other Ambulatory Visit: Payer: Self-pay | Admitting: Internal Medicine

## 2010-05-09 ENCOUNTER — Telehealth: Payer: Self-pay

## 2010-05-09 NOTE — Telephone Encounter (Signed)
Received refill request from [facility] or  Clonazepam 0.5 ( 1/2-1 qhs prn #30). Rx last refilled 11/05/09 #30/4rf. Last OV 12/20/09 Please advise

## 2010-05-09 NOTE — Telephone Encounter (Signed)
ok 

## 2010-05-13 NOTE — Telephone Encounter (Signed)
Per VO authorization,calledpharmacy/[facility] and LMOM for refill. LMOM to inform Pt.

## 2010-05-23 LAB — HM MAMMOGRAPHY: HM Mammogram: NORMAL

## 2010-06-21 NOTE — H&P (Signed)
[facility]  Patient:    Claudia Luna, Claudia Luna Visit Number: [id number] MRN: [id number]          Service Type: EMS Location: ED Attending Physician:  Sandi Raveling Dictated by:   Veverly Fells Altheimer, M.D. Admit Date:  01/03/2001                           History and Physical  REASON FOR ADMISSION:  Left flank pain and hypokalemia.  HISTORY:  This is a 55 year old woman who primarily has lived in [place] for the past 23 years where her husband works for an [employer], who also has a home in [place], who is back here visiting recently. She has medical problems including fibromyalgia and chronic fatigue and states that she tends to attribute acute symptoms to those chronic conditions. She has a history of nephrolithiasis, status post lithotripsy in 1986 and two subsequently passed kidney stones. While in [place] recently, she had a "kidney infection" with mild left flank pain and low grade fever and an abnormal urinalysis on a routine insurance exam. She was started on Floxin from November 15 through November 22. She felt better. She then had onset, on November 27, of recurrent left flank pain which was generally mild, myalgias, mild malaise, mild headaches, low grade fevers, darker urine without dysuria. She vomited briefly on November 28 and again this morning. She has had slight loose stools, occasionally during the same time period. The flank pain was fairly severe 24 hours ago and she obtained good relief with two Advil last night with mild residual flank discomfort and mild residual of her other symptoms through the day today, especially with fatigue which she partially attributes to Phenergan, which she took this morning.  Findings of interest in the emergency room included systolic blood pressure of 79 on arrival with heart rate 150, temperature 99.2, potassium 2.3, trace leukocyte esterase on urinalysis and trace heme positive  stool. There were several findings of potential interest on abdominopelvic CT scan as below. Blood pressure was still 90/60, which she states may be about her usual baseline but heart rate still at 125 after 2 liters of saline.  PAST MEDICAL HISTORY: 1. Fibromyalgia and chronic fatigue syndrome. This was diagnosed about 66    and she was followed for many years by Dr. Tim Lair when he was in [place],    [place]. She states that she was previously treated with Darvon and    then with Percocet and then with morphine at escalating doses for about two    years, which she eventually weaned herself off and ultimately quit "cold    [place]". She has not used narcotics at all for the past three years. 2. Migraines, which she says have been better since she got contact lenses. 3. History of abnormal EKGs since at least 1995 with details unavailable. 4. Gravida 4, para 4. 5. Status post hysterectomy 1986 for fibroids, status post bilateral    salpingo-oophorectomy in 1987 with incidental appendectomy. 6. Status post laparoscopic cholecystectomy in 1999. 7. Status post breast reduction. 8. Status post knee surgery.  DRUG SENSITIVITIES:  PENICILLIN resulted in rash, CODEINE caused severe vomiting, and CIPRO caused nausea.  FAMILY HISTORY:  Father has coronary disease and type 2 diabetes. Mother has asthma and ITP. Sister has hypertension. She has four children in good health.  SOCIAL HISTORY:  As noted above, she primarily lives in [place] for the  past 23 years where her husband works for an [employer]. She still has family in this area and still also has a [place] home. She has a primary care physician in [place] but no other physicians here. She has never smoked and has never consumed alcohol.  REVIEW OF SYSTEMS:  Her maximum weight was 141 pounds in September of 2002 but has come back down to her usual weight of about 114 pounds with diet. She denies any history of  diuretic or laxative use or abuse. SKIN: No complaints. EYES: Wears contacts. ENT: No complaints. BREASTS: No mass or tenderness, mammogram was negative last week. CARDIOPULMONARY: Denies dyspnea, wheezing, cough, chest pain, or palpitations. GASTROINTESTINAL: As above, otherwise just occasional stress induced vomiting. No chronic stool changes. No hematochezia or melena. GENITOURINARY: As above. MUSCULOSKELETAL: No complaints. NEUROLOGIC: Occasional numbness and tingling in her hands recently.  PHYSICAL EXAMINATION:  GENERAL:  Alert, pleasant, cooperative 55 year old white female in no acute distress.  VITAL SIGNS:  Heart rate 125, blood pressure 90/60, temperature 99.2.  SKIN:  Normal.  HEENT:  Eyes: Normal. Fundi normal. Oropharynx negative.  NECK:  Supple without thyromegaly with carotids _______ normal without bruits.  LUNGS:  Unlabored, clear to auscultation.  BACK:  Mild left flank tenderness, no vertebral tenderness.  BREASTS:  Not examined.  ABDOMEN:  Soft, minimal left lower quadrant tenderness, no mass, normal bowel sounds.  RECTAL:  Performed by emergency room physician and noted to have heme trace positive stool.  EXTREMITIES:  Pedal pulses intact without edema.  NEUROLOGIC:  Without focal deficit.  LABORATORY DATA:  Notable for potassium 2.3, WBCs 6.5, hemoglobin 14.7. Urinalysis: Trace leukocyte esterase.  EKG:  Sinus tachycardia at 128 per minute with anterolateral ST-T abnormalities.  Chest x-ray:  No active disease.  Abdominopelvic CT:  Tiny punctate nonobstructing bilateral renal calculi, 1.4 cm rounded structure in splenic hilum, shotty mesenteric nodes, likely reactive, minimal haziness around descending colon.  ASSESSMENT AND PLAN: #1 - Probable recurrent pyelonephritis, which has been recently partially      treated. Will continue IV fluids and check urine culture and      sensitivities. Empirically begin Tequin. Treat pain as needed with       Advil. She declines narcotics.  #2 - History of nephrolithiasis with tiny stones currently present of       unclear significance. Will strain urine and consider possible urology      consult.  #3 - Hypokalemia of unclear etiology. Will replete p.o. and IV. She did      not tolerate KCl runs in the emergency room. Will also check magnesium.      Recheck potassium and magnesium in the morning.  #4 - Abnormal EKG, probably old.  #5 - Heme trace positive stool with minimal haziness in the descending colon      by CT scan and question of reactive mesenteric nodes. Considerations      might include diverticulitis or inflammatory bowel disease, although      there is really nothing much in her history to support either. She is      slightly tender in the left lower quadrant. Will check stool guaiacs,      sed rate, and repeat CBC in the morning. Will consider repeat CT scan      with contrast. Consider GI consult.  #6 - Possible splenic hilum lesion on CT scan. Radiologist suggested that      contrast CT might be helpful.  #7 - Fibromyalgia and  chronic fatigue. Will check TSH, although this      reportedly has been normal in the past.  #8 - Prior narcotic dependence, none for three years.  Dictated by:   Veverly Fells. Altheimer, M.D. Attending Physician:  Sandi Raveling DD:  01/03/01 TD:  01/03/01 Job: [id number]

## 2010-06-21 NOTE — Discharge Summary (Signed)
[facility]  Patient:    Claudia Luna, CAMPUS Visit Number: [id number] MRN: [id number]          Service Type: SUR Location: 3W 0358 01 Attending Physician:  Meredith Leeds Dictated by:   Zigmund Daniel, M.D. Admit Date:  02/04/2001 Discharge Date: 02/08/2001   CC:         Veverly Fells. Altheimer, M.D.   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 She got over the diverticulitis and had a colonoscopy showing no persistent inflammatory or neoplastic disease of the colon.  After some discussion with the patient about the possibility of rupture of her aneurysm, she agreed to come into the hospital and have attempt at resection of aneurysm without splenectomy, and undergo splenectomy if necessary.  She has had four pregnancies, but has had a hysterectomy.  She has a history of fibromyalgia, migraine headaches.  Has had breast reduction, hysterectomy, other pelvic surgery, but otherwise is generally healthy with no serious chronic problems.  PHYSICAL EXAMINATION:  There were no abnormalities.  HOSPITAL COURSE:  On the day of admission the patient underwent abdominal exploration through a small left upper quadrant subcostal incision.  I found about a 2 cm aneurysm of the branch artery of the splenic artery, and completely resected the aneurysm, and got good vascular control proximally and distally.  There was remaining excellent pulse in the hilum of the spleen, and no evidence of ischemia of the spleen.  No other abdominal abnormalities were found.  The patient made a generally good recovery with no wound infection or other complication, but when discharged on 02/07/01, she got very nauseated, and also developed a headache, which is typical for  her migraine headaches.  After administration of medications for this overnight she is feeling much better. Her bowels are moving, she is eating, and incision is healing nicely.  She is discharged, and asked to see me at the office in 2 to 3 weeks, or as necessary.  DIAGNOSES: 1. Aneurysm of the splenic artery. 2. Recent diverticulitis. 3. Migraine headache. 4. History of fibromyalgia.  OPERATION:  Resection of splenic artery aneurysm.  CONDITION ON DISCHARGE:  Improved. Dictated by:   Zigmund Daniel, M.D. Attending Physician:  Meredith Leeds DD:  02/08/01 TD:  02/08/01 Job: [id number]

## 2010-06-21 NOTE — Op Note (Signed)
[facility]  Patient:    Claudia Luna, Claudia Luna Visit Number: [id number] MRN: [id number]          Service Type: SUR Location: 3W 0375 02 Attending Physician:  Meredith Leeds Dictated by:   Zigmund Daniel, M.D. Proc. Date: 02/04/01 Admit Date:  02/04/2001   CC:         Veverly Fells. Altheimer, M.D.  Venita Lick. Pleas Koch., M.D. [facility]   Operative Report  PREOPERATIVE DIAGNOSIS:  Splenic artery aneurysm.  POSTOPERATIVE DIAGNOSIS:  Splenic artery aneurysm.  OPERATION PERFORMED:  Excision of splenic artery aneurysm.  SURGEON:  Zigmund Daniel, M.D.  ANESTHESIA:  General.  DESCRIPTION OF PROCEDURE:  After the patient was anesthetized and monitored and had routine preparation and draping of the abdomen, I made a short left subcostal incision paralleling the costal margin, dissected through the lateral part of the rectus muscle and medial part of the lateral flat muscles and entered the abdomen. I explored as well as I could through the small incision finding no evidence of acute diverticulitis in the left lower quadrant or of any abnormality otherwise except for some adhesions. I could not initially palpate an aneurysm in the hilum of the spleen, but took down adhesions of the splenic flexure of the colon to the spleen tip and diaphragm and then some lateral adhesions of the spleen and mobilized it a bit away from the stomach and I could palpate an aneurysm in the hilum but separate from the pulp of the spleen. I cut the peritoneum over it and dissected down on it after making sure that I had good access to the spleen posteriorly in order to get control of the main splenic artery if needed quickly. I then isolated the aneurysm and noted that it was fed by a branch artery to the upper part of the spleen and I clipped and tied that artery and divided it. I dissected on around it to make sure all the vascular connections were divided and I  used clips and ligatures as needed and totally excised the aneurysm. It was necessary to clip one small vein as well. Hemostasis was then excellent. There was still a good pulse out in most of the hilum of the spleen. The spleen had a normal appearance the ligature of the aneurysm. I irrigated the area and removed the irrigant, checked carefully to be sure that there were no persistent bleeders. I then closed the abdomen with running #1 PDS in two layers and muscles and with intracuticular 4-0 Vicryl and Steri-Strips for the skin. Sponge, needle and instrument counts were correct. The patient was stable throughout the procedure. Dictated by:   Zigmund Daniel, M.D. Attending Physician:  Meredith Leeds DD:  02/04/01 TD:  02/05/01 Job: [id number]

## 2010-06-21 NOTE — Discharge Summary (Signed)
[facility]  Patient:    Claudia Luna, Claudia Luna Visit Number: [id number] MRN: [id number]          Service Type: MED Location: 3W 0355 01 Attending Physician:  Altheimer, [place] Dictated by:   Veverly Fells. Altheimer, M.D. Admit Date:  01/03/2001 Discharge Date: 01/06/2001   CC:         Barbette Hair. Arlyce Dice, M.D.   Discharge Summary  REASON FOR ADMISSION:  Left flank and left lower quadrant pain, and hypokalemia.  HISTORY OF PRESENT ILLNESS:  This is a 55 year old woman who primarily has lived in [place] for the past 23 years where her husband works for an [employer].  They also have a home in [place], and she has been back here visiting recently.  Medical problems include fibromyalgia and chronic fatigue. She has a history of nephrolithiasis, status post lithotripsy in 1986, and two subsequently passed kidney stones.  While in [place] recently she had a "kidney infection" with mild left flank pain and low-grade fever, and abnormal urinalysis, and was started on Floxin on November 15 through December 25, 2000, with improvement.  She then had onset on December 30, 2000, of recurrent left flank pain which was generally mild associated with myalgias, mild malaise, mild headaches, low grade fevers, darker urine without dysuria. She vomited briefly on December 31, 2000, and again on the morning of admission.  She had some slight loose stools occasionally during the same time.  The flank pain became fairly severe 24 hours prior to admission with fairly good relief with two Advil.  She continued at the time of admission to have mild residual flank discomfort, mild left lower quadrant discomfort, mild fatigue, and malaise.  She had taken some Phenergan on the morning of admission which made her drowsy.  PAST MEDICAL HISTORY: 1. Fibromyalgia and chronic fatigue syndrome.  This was diagnosed about 52,    and she was followed for many years by  Dr. Tim Lair when he was in [place],    [place].  She states she was previously treated with Darvon and then    with Percocet and then with morphine at escalating doses for about two    years which she eventually weaned herself off, and ultimately quit "cold    [place]."  She has not used narcotics at all for the past three years. 2. Migraines. 3. History of abnormal EKGs since at least 1995, with details unavailable. 4. Gravida 4, para 4. 5. Status post hysterectomy in 1986 for fibroids, status post bilateral    salpingo-oophorectomy in 1987 with incidental appendectomy. 6. Status post laparoscopic cholecystectomy in 1999. 7. Status post breast reduction. 8. Status post knee surgery.  ALLERGIES:  PENICILLIN resulted in rash, CODEINE caused severe vomiting, and CIPRO caused nausea.  MEDICATIONS: 1. Premarin 0.625 mg q.d. 2. Paxil 20 mg q.d. 3. Maxalt p.r.n. migraines. 4. Advil p.r.n. as above. 5. Phenergan p.r.n. as above.  Initially, the patient denied laxative use or abuse as her family was present at the time of the initial interview, however, the morning after admission she admitted to significant chronic laxative abuse.  She stated that this started in 1997, when she was traveling in [place] and got into a cycle of using Imodium and laxatives.  She denies any diuretic use.  FAMILY HISTORY:  Father has coronary disease and type 2 diabetes.  Mother has asthma and ITP.  Sister has hypertension.  She has four children in good health.  SOCIAL HISTORY:  As noted, she primarily lives in [place] for the past 23 years where her husband works for an [employer].  She still has family in this area, and still also has a [place] home.  Her primary care physician is in [place], and she has not had any other physicians in [place], although she has given thought to also having a primary care physician here. She has never smoked, and has never consumed alcohol.  REVIEW  OF SYSTEMS:  Maximum weight was 141 pounds in September 2002, but has come back down to her usual weight of 114 pounds with diet.  She wears contact lenses.  Mammogram was negative last week.  She denied cardiopulmonary symptoms.  Gastrointestinal as above with occasional stress induced vomiting in the past and loose stools several times a week related to ongoing laxative abuse.  Occasional numbness and tingling in her hands recently.  ADMISSION PHYSICAL EXAMINATION:  GENERAL:  Alert, pleasant, cooperative 55 year old white female in no acute distress.  VITAL SIGNS:  Heart rate 125, blood pressure 90/60 after having received 2 L of saline.  Her initial blood pressure on arrival was 79 with heart rate of 150.  Temperature is 99.2.  SKIN:  Normal.  HEENT:  Eyes normal.  Oropharynx negative.  NECK:  Supple without thyromegaly or bruit.  LUNGS:  Unlabored and clear.  BACK:  Notable for mild left flank tenderness and no vertebral tenderness.  BREASTS:  Not examined.  ABDOMEN:  Soft with minimal left lower quadrant tenderness, no mass, normal bowel sounds.  RECTAL:  Performed by emergency room physician, and noted to have heme trace positive stool.  EXTREMITIES:  Negative.  NEUROLOGIC:  Negative.  ADMISSION LABORATORY DATA:  Notable for potassium of 2.3, white blood cell count 6.5, hemoglobin 14.7.  Urinalysis with trace ketones, positive protein, trace leukocyte esterase, many epithelial cells, many bacteria, 3 to 6 white blood cells.  Chest x-ray was negative.  EKG showed sinus tachycardia at 128 per minute with ST-T abnormality anterolaterally.  CT scan without contrast of the abdomen and pelvis at the time of admission showed tiny punctate nonobstructing renal calculi right greater than left, with no evidence of hydronephrosis or obstruction, 1.4 cm rounded structure in the region of the splenic hilum with question of splenic artery aneurysm  versus splenic venous  varix with contrast study recommended for further evaluation, question mild haziness in the fat adjacent to the descending colon, shotty mesenteric lymph nodes likely reactive, status post hysterectomy.  HOSPITAL COURSE:  She was treated in the emergency room with over 2 L of saline and potassium and admitted.  IV fluids were continued, as well as potassium repletion p.o. and IV.  She was empirically started on Tequin which was later changed to Cipro.  She declined any narcotic analgesics, and was treated with occasional doses of Advil.  Urine was strained, but no stones were captured.  Repeat stool guaiac was negative.  By the morning of 01/04/01, she was continued to have some mild left flank pain and slight left lower quadrant discomfort.  Temperature was down to 97.6.  She remained relatively hypotensive, apparently that is her baseline.  She was also treated with some Slow-Mag.  The importance of stopping laxative use was discussed, and she had a good understanding of this.  Hemoglobin was down to 10.6 after rehydration with MCV 87.  Repeat white blood cell count was 5.1, and sedimentation rate was 15.  Ferratin, B12, and folate and TSH were all normal.  Serum protein electrophoresis is still pending.  She was seen in GI consultation by Melvia Heaps, M.D. on 01/04/01, with impression of probable diverticulitis with heme positive stool felt likely secondary to that.  Chronic laxative abuse was again noted, and the hypokalemia and hypomagnesemia felt to be secondary to that.  She was at that point switched to IV Cipro.  Repeat CT scan with contrast showed 1.5 x 1.3 cm diameter splenic artery aneurysm, questionable wall thickening of the distal descending colon, and prior cholecystectomy and hysterectomy.  By 12/3, the left lower quadrant pain was a bit worse, and was worse than the left flank pain.  She was tolerating full liquids.  She was remaining afebrile with white blood cell count  5.7.  Hemoglobin was 10.8, and albumin 2.6.  Potassium was up to 3.4, and magnesium 1.5.  She was treated with a magnesium IV infusion.  She generally was reporting feeling much better, in fact, the best she had felt in years.  By the day of discharge, she was feeling significantly better with minimal left lower quadrant and left flank discomfort.  She was tolerating full liquids with no nausea.  She had a small bowel movement.  She remained afebrile.  Blood pressure was up 107/60. White blood cell count 5.0, albumin 2.8.  She continued to have minimal left lower quadrant tenderness.  Hemoglobin was 11.3 with serum protein electrophoresis still pending.  Potassium 4.4, magnesium 2.1.  FINAL DIAGNOSES:  1. Presumed diverticulitis.  2. Heme positive stool, probably secondary to #1.  3. Possible left pyelonephritis versus pain referred from #1 with incidental     pyuria, bacteriuria.  4. Prior history of nephrolithiasis with current tiny stones present.  5. History of chronic laxative abuse.  6. Hypokalemia secondary to laxative abuse.  7. Hypomagnesemia secondary to laxative abuse.  8. Anemia, normocytic, probably multifactorial.  Needs outpatient followup.  9. Fibromyalgia and chronic fatigue syndrome. 10. Prior history of narcotic dependence, none since 1999. 11. Migraines. 12. History of abnormal EKGs. 13. Status post hysterectomy in 1986, status post bilateral     salpingo-oophorectomy in 1987 with incidental appendectomy. 14. Status post laparoscopic cholecystectomy in 1999. 15. Possible 1.2 x 1.3 cm splenic artery aneurysm of uncertain significance.     Will need outpatient followup and possible surgical opinion.  CONDITION ON DISCHARGE:  Much improved.  DISCHARGE MEDICATIONS: 1. Cipro 500 mg b.i.d. x 2 weeks. 2. K-Dur 20 mEq p.o. q.d. x 1 week. 3. Advil one or two q.4h. p.r.n. 4. Paxil 20 mg q.d. 5. Premarin 0.625 mg q.d. 6. Maxalt p.r.n.  She understands and agrees to  the importance of stopping laxative use.  If needed, Miralax may be prescribed at the recommendation of GI, although I would prefer to defer that prescription to them in the proper context.  DIET:  Low residue diet, or as instructed by GI.  ACTIVITY:  As tolerated.  FOLLOWUP: 1. Dr. Veverly Fells. Altheimer in one week. 2. Melvia Heaps, M.D. in two weeks, or as instructed.  She is to call sooner if there are any problems.  Surgical consultation to be arranged by GI in regard to the possible splenic artery aneurysm. Dictated by:   Veverly Fells. Altheimer, M.D. Attending Physician:  Brennan Bailey DD:  01/06/01 TD:  01/06/01 Job: [id number]

## 2010-07-20 ENCOUNTER — Other Ambulatory Visit: Payer: Self-pay | Admitting: Internal Medicine

## 2010-08-20 ENCOUNTER — Other Ambulatory Visit: Payer: Self-pay | Admitting: Internal Medicine

## 2010-09-19 ENCOUNTER — Other Ambulatory Visit: Payer: Self-pay | Admitting: Internal Medicine

## 2010-09-23 ENCOUNTER — Encounter: Payer: Self-pay | Admitting: Internal Medicine

## 2010-09-23 ENCOUNTER — Ambulatory Visit (INDEPENDENT_AMBULATORY_CARE_PROVIDER_SITE_OTHER): Payer: Managed Care, Other (non HMO) | Admitting: Internal Medicine

## 2010-09-23 VITALS — BP 100/70 | HR 80 | Temp 98.4°F | Resp 16 | Ht 61.0 in | Wt 114.0 lb

## 2010-09-23 DIAGNOSIS — B370 Candidal stomatitis: Secondary | ICD-10-CM | POA: Insufficient documentation

## 2010-09-23 DIAGNOSIS — Z23 Encounter for immunization: Secondary | ICD-10-CM

## 2010-09-23 DIAGNOSIS — J45909 Unspecified asthma, uncomplicated: Secondary | ICD-10-CM

## 2010-09-23 MED ORDER — CLOTRIMAZOLE 10 MG MT TROC: 10.0000 mg | Freq: Three times a day (TID) | OROMUCOSAL | Status: DC

## 2010-09-23 NOTE — Patient Instructions (Signed)
Thrush, Adult      Thrush is a yeast infection that develops in the mouth and throat and on the tongue. The medical term for this is oropharyngeal candidiasis, or OPC. Thrush is most common in older adults, but it can occur at any age. Thrush occurs when a yeast called candida grows out of control. Candida normally is present in small amounts in the mouth and on other mucous membranes. However, under certain circumstances, candida can grow rapidly, causing thrush. Thrush can be a recurring problem for people who have chronic illnesses or who take medications that limit the body's ability to fight infection (weakened immune system). Since these people have difficulty fighting infections, the fungus that causes thrush can spread throughout the body. This can cause life-threatening blood or organ infections.     CAUSES   Candida, the yeast that causes thrush, is normally present in small amounts in the mouth and on other mucous membranes. It usually causes no harm. However, when conditions are present that allow the yeast to grow uncontrolled, it invades surrounding tissues and becomes an infection. Thrush is most commonly caused by the yeast Candida albicans. Less often, other forms of candida can lead to thrush.     There are many types of bacteria in your mouth that normally control the growth of candida. Sometimes a new type of bacteria gets into your mouth and disrupts the balance of the germs already there. This can allow candida to overgrow. Other factors that increase your risk of developing thrush include:   An impaired ability to fight infection (weakened immune system). A normal immune system is usually strong enough to prevent candida from overgrowing.    Older adults are more likely to develop thrush because they may have weaker immune systems.    People with human immunodeficiency virus (HIV) infection have a high likelihood of developing thrush. About 90% of people with HIV develop thrush at some  point during the course of their disease.   People with diabetes are more likely to get thrush because high blood sugar levels promote overgrowth of the candida fungus.   A dry mouth (xerostomia). Dry mouth can result from overuse of mouthwashes or from certain conditions such as Sjgren's syndrome.    Pregnancy. Hormone changes during pregnancy can lead to thrush by altering the balance of bacteria in the mouth.    Poor dental care, especially in people who have false teeth.    The use of antibiotic medications. This may lead to thrush by changing the balance of bacteria in the mouth.     SYMPTOMS  Thrush can be a mild infection that causes no symptoms. If symptoms develop, they may include the following:   A burning feeling in the mouth and throat. This can occur at the start of a thrush infection.    White patches that adhere to the mouth and tongue. The tissue around the patches may be red, raw, and painful. If rubbed (during tooth brushing, for example), the patches and the tissue of the mouth may bleed easily.    A bad taste in the mouth or difficulty tasting foods.   Cottony feeling in the mouth.   Sometimes pain during eating and swallowing.     DIAGNOSIS  Your caregiver can usually diagnose thrush by exam. In addition to looking in your mouth, your caregiver will ask you questions about your health.     TREATMENT  Medications that help prevent the growth of fungi (antifungals) are the standard   treatment for thrush. These medications are either applied directly to the affected area (topical) or swallowed (oral).  Mild thrush  In adults, mild cases of thrush may clear up with simple treatment that can be done at home. This treatment usually involves using an antifungal mouth rinse or lozenges. Treatment usually lasts about 14 days.  Moderate to severe thrush   More severe thrush infections that have spread to the esophagus are treated with an oral antifungal medication. A topical antifungal  medication may also be used.   For some severe infections, a treatment period longer than 14 days may be needed.   Oral antifungal medications are almost never used during pregnancy because the fetus may be harmed. However, if a pregnant woman has a rare, severe thrush infection that has spread to her blood, oral antifungal medications may be used. In this case, the risk of harm to the mother and fetus from the severe thrush infection may be greater than the risk posed by the use of antifungal medications.  Persistent or recurrent thrush  Persistent (does not go away) or recurrent (keeps coming back) cases of thrush may:   Need to be treated twice as long as the symptoms last.    Require treatment with both oral and topical antifungal medications.   People with weakened immune systems can take an antifungal medication on a continuous basis to prevent thrush infections.  It is important to treat conditions that make you more likely to get thrush, such as diabetes, human immunodeficiency virus (HIV), or cancer.      HOME CARE INSTRUCTIONS   If you are breast-feeding, you should clean your nipples with an antifungal medication, such as nystatin (Mycostatin). Dry your nipples after breast-feeding. Applying lanolin-containing body lotion may help relieve nipple soreness.   If you wear dentures and get thrush, remove dentures before going to bed, brush them vigorously, and soak in a solution of chlorhexidine gluconate or a product such as Polident or Efferdent.   Eating plain, unflavored yogurt that contains live cultures (check the label) can also help cure thrush. Yogurt helps healthy bacteria grow in the mouth. These bacteria stop the growth of the yeast that causes thrush.   Adults can treat thrush at home with gentian violet (1%), a dye that kills bacteria and fungi. It is available without a prescription. If there is no known cause for the infection or if gentian violet does not cure the thrush, you need  to see your caregiver.  Comfort measures  Measures can be taken to reduce the discomfort of thrush:   Drink cold liquids such as water or iced tea. Eat flavored ice treats or frozen juices.    Eat foods that are easy to swallow such as gelatin, ice cream, or custard.    If the patches are painful, try drinking from a straw.    Rinse your mouth several times a day with a warm saltwater rinse. You can make the saltwater mixture with 1 tsp (5 g) of salt in 8 fl oz (0.2 L) of warm water.     PROGNOSIS   Most cases of thrush are mild and clear up with the use of an antifungal mouth rinse or lozenges. Very mild cases of thrush may clear up without medical treatment. It usually takes about 14 days of treatment with an oral antifungal medication to cure more severe thrush infections. In some cases, thrush may last several weeks even with treatment.   If thrush goes untreated and does   not go away by itself, it can spread to other parts of the body.   Thrush can spread to the throat, the vagina, or the skin. It rarely spreads to other organs of the body.  Thrush is more likely to recur (come back) in:   People who use inhaled corticosteroids to treat asthma.    People who take antibiotic medications for a long time.    People who have false teeth.    People who have a weakened immune system.      COMPLICATIONS  Complications related to thrush are rare in healthy people.     THERE ARE SEVERAL FACTORS THAT CAN INCREASE YOUR RISK OF DEVELOPING THRUSH.  Age  Older adults, especially those who have serious health problems, are more likely to develop thrush because their immune systems are likely to be weaker.  Behavior   The yeast that causes thrush can be spread by oral sex.    Heavy smoking can lower the body's ability to fight off infections. This makes thrush more likely to develop.  Other conditions   False teeth (dentures), braces, or a retainer that irritates the mouth make it hard to keep the mouth clean. An  unclean mouth is more likely to develop thrush than a clean mouth.    People with a weakened immune system, such as those who have diabetes or human immunodeficiency virus (HIV) or who are undergoing chemotherapy, have an increased risk for developing thrush.  Medications  Some medications can allow the fungus that causes thrush to grow uncontrolled. Common ones are:   Antibiotics, especially those that kill a wide range of organisms (broad-spectrum antibiotics), such as tetracycline commonly can cause thrush.    Birth control pills (oral contraceptives).    Medications that weaken the body's immune system, such as corticosteroids.  Environment  Exposure over time to certain environmental chemicals, such as benzene and pesticides, can weaken the body's immune system. This increases your risk for developing infections, including thrush.     SEEK IMMEDIATE MEDICAL ATTENTION IF:   Your symptoms are getting worse or are not improving within 7 days of starting treatment.   You have symptoms of spreading infection, such as white patches on the skin outside of the mouth.   You are nursing and you have redness and pain in the nipples in spite of home treatment or if you have burning pain in the nipple area when you nurse. Your baby's mouth should also be examined to determine whether thrush is causing your symptoms.     Document Released: 10/16/2003  Document Re-Released: 04/18/2008  [employer] Patient Information 2011 [employer].

## 2010-09-24 ENCOUNTER — Encounter: Payer: Self-pay | Admitting: Internal Medicine

## 2010-09-24 NOTE — Assessment & Plan Note (Signed)
Start mycelex troches

## 2010-09-24 NOTE — Assessment & Plan Note (Signed)
She is doing well on dulera 

## 2010-09-24 NOTE — Progress Notes (Signed)
  Subjective:    Patient ID: Claudia Luna, female    DOB: 08/04/1955, 55 y.o.   MRN: [id number]  HPI She returns for f/up and she tells me that she thinks she has thrush in her mouth.   Review of Systems  Constitutional: Negative for fever, chills, diaphoresis, activity change, appetite change, fatigue and unexpected weight change.  HENT: Positive for mouth sores. Negative for ear pain, sore throat, facial swelling, drooling, trouble swallowing, neck pain, dental problem, voice change and sinus pressure.   Eyes: Negative.   Respiratory: Negative for apnea, cough, choking, chest tightness, shortness of breath, wheezing and stridor.   Cardiovascular: Negative for chest pain, palpitations and leg swelling.  Gastrointestinal: Negative for nausea, vomiting, abdominal pain, diarrhea, constipation, blood in stool, anal bleeding and rectal pain.  Genitourinary: Negative for dysuria, urgency, frequency, hematuria, decreased urine volume, enuresis, difficulty urinating and dyspareunia.  Musculoskeletal: Negative for myalgias, back pain, joint swelling, arthralgias and gait problem.  Skin: Negative for color change, pallor, rash and wound.  Neurological: Negative for dizziness, tremors, seizures, syncope, facial asymmetry, speech difficulty, weakness, light-headedness, numbness and headaches.  Hematological: Negative for adenopathy. Does not bruise/bleed easily.  Psychiatric/Behavioral: Negative.        Objective:   Physical Exam  Vitals reviewed. Constitutional: She is oriented to person, place, and time. She appears well-developed and well-nourished. No distress.  HENT:  Head: No trismus in the jaw.  Mouth/Throat: Oropharynx is clear and moist. Mucous membranes are not pale, not dry and not cyanotic. No oral lesions. No uvula swelling. No oropharyngeal exudate, posterior oropharyngeal edema, posterior oropharyngeal erythema or tonsillar abscesses.  Eyes: Conjunctivae and EOM are normal.  Pupils are equal, round, and reactive to light. Right eye exhibits no discharge. Left eye exhibits no discharge. No scleral icterus.  Neck: Normal range of motion. Neck supple. No JVD present. No tracheal deviation present. No thyromegaly present.  Cardiovascular: Normal rate, regular rhythm, normal heart sounds and intact distal pulses.  Exam reveals no gallop and no friction rub.   No murmur heard. Pulmonary/Chest: Effort normal and breath sounds normal. No stridor. No respiratory distress. She has no wheezes. She has no rales. She exhibits no tenderness.  Abdominal: Soft. Bowel sounds are normal. She exhibits no distension and no mass. There is no tenderness. There is no rebound and no guarding.  Musculoskeletal: Normal range of motion. She exhibits no edema and no tenderness.  Lymphadenopathy:    She has no cervical adenopathy.  Neurological: She is alert and oriented to person, place, and time. She has normal reflexes.  Skin: Skin is warm and dry. No rash noted. She is not diaphoretic. No erythema. No pallor.  Psychiatric: She has a normal mood and affect. Her behavior is normal. Judgment and thought content normal.          Assessment & Plan:

## 2010-10-09 ENCOUNTER — Other Ambulatory Visit: Payer: Self-pay | Admitting: Internal Medicine

## 2010-10-11 ENCOUNTER — Telehealth: Payer: Self-pay

## 2010-10-11 MED ORDER — FLUCONAZOLE 100 MG PO TABS: ORAL_TABLET | ORAL | Status: AC

## 2010-10-11 NOTE — Telephone Encounter (Signed)
Patient/husband notified.

## 2010-10-11 NOTE — Telephone Encounter (Signed)
Patient husband called [facility] c/o continued thrush. He stated  that she was seen for thrush and given clotrimazole lozenges. They are requesting something stronger in a pill form to help. Please advise/PCP out of office Thanks

## 2010-10-11 NOTE — Telephone Encounter (Signed)
Try Diflucan po. Dr Yetta Barre in 2 wks

## 2010-10-20 ENCOUNTER — Other Ambulatory Visit: Payer: Self-pay | Admitting: Internal Medicine

## 2010-10-21 ENCOUNTER — Telehealth: Payer: Self-pay

## 2010-10-21 ENCOUNTER — Ambulatory Visit (INDEPENDENT_AMBULATORY_CARE_PROVIDER_SITE_OTHER): Payer: Managed Care, Other (non HMO)

## 2010-10-21 DIAGNOSIS — Z23 Encounter for immunization: Secondary | ICD-10-CM

## 2010-10-21 NOTE — Telephone Encounter (Signed)
Patient came in for 2nd Hepa/b injection and wanted to get a messg to MD. Patient c/o recurrent thrush and was told that if it returns that she may need a longer regeman of diflucan. She is requesting refill and also wants to know how long MD feels that she will need this. Please advise Thanks

## 2010-10-23 NOTE — Telephone Encounter (Signed)
Scheduled for tomorrow at 415 

## 2010-10-23 NOTE — Telephone Encounter (Signed)
I don't think it is thrush, she needs to be seen

## 2010-10-24 ENCOUNTER — Ambulatory Visit (INDEPENDENT_AMBULATORY_CARE_PROVIDER_SITE_OTHER): Payer: Managed Care, Other (non HMO) | Admitting: Internal Medicine

## 2010-10-24 ENCOUNTER — Encounter: Payer: Self-pay | Admitting: Internal Medicine

## 2010-10-24 VITALS — BP 102/72 | HR 80 | Temp 98.7°F | Resp 16 | Wt 115.0 lb

## 2010-10-24 DIAGNOSIS — B370 Candidal stomatitis: Secondary | ICD-10-CM

## 2010-10-24 MED ORDER — DIPHENHYD-HYDROCORT-NYSTATIN MT SUSP: 5.0000 mL | Freq: Four times a day (QID) | OROMUCOSAL | Status: DC | PRN

## 2010-10-24 NOTE — Progress Notes (Signed)
  Subjective:    Patient ID: Claudia Luna, female    DOB: 1955-07-05, 55 y.o.   MRN: [id number]  HPI She thinks she still has thrush.  Review of Systems  Constitutional: Negative.   HENT: Positive for mouth sores. Negative for ear pain, nosebleeds, congestion, sore throat, facial swelling, rhinorrhea, sneezing, drooling, trouble swallowing, neck pain, neck stiffness, dental problem, voice change, postnasal drip, sinus pressure, tinnitus and ear discharge.   Eyes: Negative.   Respiratory: Negative.   Cardiovascular: Negative for chest pain, palpitations and leg swelling.  Gastrointestinal: Negative.   Genitourinary: Negative.   Musculoskeletal: Negative.   Skin: Negative.   Neurological: Negative.   Hematological: Negative for adenopathy. Does not bruise/bleed easily.  Psychiatric/Behavioral: Negative.        Objective:   Physical Exam  Vitals reviewed. Constitutional: She is oriented to person, place, and time. She appears well-developed and well-nourished. No distress.  HENT:  Head: No trismus in the jaw.  Mouth/Throat: Uvula is midline, oropharynx is clear and moist and mucous membranes are normal. Mucous membranes are not pale, not dry and not cyanotic. She does not have dentures. No oral lesions. Normal dentition. No dental abscesses, uvula swelling, lacerations or dental caries. No oropharyngeal exudate, posterior oropharyngeal edema, posterior oropharyngeal erythema or tonsillar abscesses.  Eyes: Conjunctivae are normal. Right eye exhibits no discharge. Left eye exhibits no discharge. No scleral icterus.  Neck: Normal range of motion. Neck supple. No JVD present. No tracheal deviation present. No thyromegaly present.  Cardiovascular: Normal rate, regular rhythm, normal heart sounds and intact distal pulses.  Exam reveals no gallop and no friction rub.   No murmur heard. Pulmonary/Chest: Effort normal and breath sounds normal. No stridor. No respiratory distress. She has  no wheezes. She has no rales. She exhibits no tenderness.  Abdominal: Soft. Bowel sounds are normal. She exhibits no distension and no mass. There is no tenderness. There is no rebound and no guarding.  Musculoskeletal: Normal range of motion. She exhibits no edema and no tenderness.  Lymphadenopathy:    She has no cervical adenopathy.  Neurological: She is oriented to person, place, and time. She displays normal reflexes. She exhibits normal muscle tone.  Skin: Skin is warm and dry. No rash noted. She is not diaphoretic. No erythema. No pallor.  Psychiatric: She has a normal mood and affect. Her behavior is normal. Judgment and thought content normal.          Assessment & Plan:

## 2010-10-24 NOTE — Assessment & Plan Note (Signed)
She has tried diflucan without much relief so I will try nystatin-diphenhid-hydrocortisone sw sp instead, her exam is normal today

## 2010-10-24 NOTE — Patient Instructions (Signed)
Thrush, Adult      Thrush is a yeast infection that develops in the mouth and throat and on the tongue. The medical term for this is oropharyngeal candidiasis, or OPC. Thrush is most common in older adults, but it can occur at any age. Thrush occurs when a yeast called candida grows out of control. Candida normally is present in small amounts in the mouth and on other mucous membranes. However, under certain circumstances, candida can grow rapidly, causing thrush. Thrush can be a recurring problem for people who have chronic illnesses or who take medications that limit the body's ability to fight infection (weakened immune system). Since these people have difficulty fighting infections, the fungus that causes thrush can spread throughout the body. This can cause life-threatening blood or organ infections.     CAUSES   Candida, the yeast that causes thrush, is normally present in small amounts in the mouth and on other mucous membranes. It usually causes no harm. However, when conditions are present that allow the yeast to grow uncontrolled, it invades surrounding tissues and becomes an infection. Thrush is most commonly caused by the yeast Candida albicans. Less often, other forms of candida can lead to thrush.     There are many types of bacteria in your mouth that normally control the growth of candida. Sometimes a new type of bacteria gets into your mouth and disrupts the balance of the germs already there. This can allow candida to overgrow. Other factors that increase your risk of developing thrush include:   An impaired ability to fight infection (weakened immune system). A normal immune system is usually strong enough to prevent candida from overgrowing.    Older adults are more likely to develop thrush because they may have weaker immune systems.    People with human immunodeficiency virus (HIV) infection have a high likelihood of developing thrush. About 90% of people with HIV develop thrush at some  point during the course of their disease.   People with diabetes are more likely to get thrush because high blood sugar levels promote overgrowth of the candida fungus.   A dry mouth (xerostomia). Dry mouth can result from overuse of mouthwashes or from certain conditions such as Sjgren's syndrome.    Pregnancy. Hormone changes during pregnancy can lead to thrush by altering the balance of bacteria in the mouth.    Poor dental care, especially in people who have false teeth.    The use of antibiotic medications. This may lead to thrush by changing the balance of bacteria in the mouth.     SYMPTOMS  Thrush can be a mild infection that causes no symptoms. If symptoms develop, they may include the following:   A burning feeling in the mouth and throat. This can occur at the start of a thrush infection.    White patches that adhere to the mouth and tongue. The tissue around the patches may be red, raw, and painful. If rubbed (during tooth brushing, for example), the patches and the tissue of the mouth may bleed easily.    A bad taste in the mouth or difficulty tasting foods.   Cottony feeling in the mouth.   Sometimes pain during eating and swallowing.     DIAGNOSIS  Your caregiver can usually diagnose thrush by exam. In addition to looking in your mouth, your caregiver will ask you questions about your health.     TREATMENT  Medications that help prevent the growth of fungi (antifungals) are the standard   treatment for thrush. These medications are either applied directly to the affected area (topical) or swallowed (oral).  Mild thrush  In adults, mild cases of thrush may clear up with simple treatment that can be done at home. This treatment usually involves using an antifungal mouth rinse or lozenges. Treatment usually lasts about 14 days.  Moderate to severe thrush   More severe thrush infections that have spread to the esophagus are treated with an oral antifungal medication. A topical antifungal  medication may also be used.   For some severe infections, a treatment period longer than 14 days may be needed.   Oral antifungal medications are almost never used during pregnancy because the fetus may be harmed. However, if a pregnant woman has a rare, severe thrush infection that has spread to her blood, oral antifungal medications may be used. In this case, the risk of harm to the mother and fetus from the severe thrush infection may be greater than the risk posed by the use of antifungal medications.  Persistent or recurrent thrush  Persistent (does not go away) or recurrent (keeps coming back) cases of thrush may:   Need to be treated twice as long as the symptoms last.    Require treatment with both oral and topical antifungal medications.   People with weakened immune systems can take an antifungal medication on a continuous basis to prevent thrush infections.  It is important to treat conditions that make you more likely to get thrush, such as diabetes, human immunodeficiency virus (HIV), or cancer.      HOME CARE INSTRUCTIONS   If you are breast-feeding, you should clean your nipples with an antifungal medication, such as nystatin (Mycostatin). Dry your nipples after breast-feeding. Applying lanolin-containing body lotion may help relieve nipple soreness.   If you wear dentures and get thrush, remove dentures before going to bed, brush them vigorously, and soak in a solution of chlorhexidine gluconate or a product such as Polident or Efferdent.   Eating plain, unflavored yogurt that contains live cultures (check the label) can also help cure thrush. Yogurt helps healthy bacteria grow in the mouth. These bacteria stop the growth of the yeast that causes thrush.   Adults can treat thrush at home with gentian violet (1%), a dye that kills bacteria and fungi. It is available without a prescription. If there is no known cause for the infection or if gentian violet does not cure the thrush, you need  to see your caregiver.  Comfort measures  Measures can be taken to reduce the discomfort of thrush:   Drink cold liquids such as water or iced tea. Eat flavored ice treats or frozen juices.    Eat foods that are easy to swallow such as gelatin, ice cream, or custard.    If the patches are painful, try drinking from a straw.    Rinse your mouth several times a day with a warm saltwater rinse. You can make the saltwater mixture with 1 tsp (5 g) of salt in 8 fl oz (0.2 L) of warm water.     PROGNOSIS   Most cases of thrush are mild and clear up with the use of an antifungal mouth rinse or lozenges. Very mild cases of thrush may clear up without medical treatment. It usually takes about 14 days of treatment with an oral antifungal medication to cure more severe thrush infections. In some cases, thrush may last several weeks even with treatment.   If thrush goes untreated and does   not go away by itself, it can spread to other parts of the body.   Thrush can spread to the throat, the vagina, or the skin. It rarely spreads to other organs of the body.  Thrush is more likely to recur (come back) in:   People who use inhaled corticosteroids to treat asthma.    People who take antibiotic medications for a long time.    People who have false teeth.    People who have a weakened immune system.      COMPLICATIONS  Complications related to thrush are rare in healthy people.     THERE ARE SEVERAL FACTORS THAT CAN INCREASE YOUR RISK OF DEVELOPING THRUSH.  Age  Older adults, especially those who have serious health problems, are more likely to develop thrush because their immune systems are likely to be weaker.  Behavior   The yeast that causes thrush can be spread by oral sex.    Heavy smoking can lower the body's ability to fight off infections. This makes thrush more likely to develop.  Other conditions   False teeth (dentures), braces, or a retainer that irritates the mouth make it hard to keep the mouth clean. An  unclean mouth is more likely to develop thrush than a clean mouth.    People with a weakened immune system, such as those who have diabetes or human immunodeficiency virus (HIV) or who are undergoing chemotherapy, have an increased risk for developing thrush.  Medications  Some medications can allow the fungus that causes thrush to grow uncontrolled. Common ones are:   Antibiotics, especially those that kill a wide range of organisms (broad-spectrum antibiotics), such as tetracycline commonly can cause thrush.    Birth control pills (oral contraceptives).    Medications that weaken the body's immune system, such as corticosteroids.  Environment  Exposure over time to certain environmental chemicals, such as benzene and pesticides, can weaken the body's immune system. This increases your risk for developing infections, including thrush.     SEEK IMMEDIATE MEDICAL ATTENTION IF:   Your symptoms are getting worse or are not improving within 7 days of starting treatment.   You have symptoms of spreading infection, such as white patches on the skin outside of the mouth.   You are nursing and you have redness and pain in the nipples in spite of home treatment or if you have burning pain in the nipple area when you nurse. Your baby's mouth should also be examined to determine whether thrush is causing your symptoms.     Document Released: 10/16/2003  Document Re-Released: 04/18/2008  [employer] Patient Information 2011 [employer].

## 2010-11-12 ENCOUNTER — Other Ambulatory Visit: Payer: Self-pay | Admitting: Internal Medicine

## 2010-11-13 ENCOUNTER — Telehealth: Payer: Self-pay

## 2010-11-13 NOTE — Telephone Encounter (Signed)
yes

## 2010-11-13 NOTE — Telephone Encounter (Signed)
Please advise if ok to refill clonazepam for pt. Thanks

## 2010-11-14 MED ORDER — CLONAZEPAM 0.5 MG PO TABS: 0.5000 mg | ORAL_TABLET | Freq: Every evening | ORAL | Status: DC | PRN

## 2010-11-30 ENCOUNTER — Other Ambulatory Visit: Payer: Self-pay | Admitting: Internal Medicine

## 2010-12-31 ENCOUNTER — Other Ambulatory Visit: Payer: Self-pay | Admitting: Internal Medicine

## 2011-01-31 ENCOUNTER — Other Ambulatory Visit: Payer: Self-pay | Admitting: Internal Medicine

## 2011-03-05 ENCOUNTER — Other Ambulatory Visit: Payer: Self-pay | Admitting: Internal Medicine

## 2011-03-24 ENCOUNTER — Ambulatory Visit: Payer: Managed Care, Other (non HMO)

## 2011-03-27 ENCOUNTER — Ambulatory Visit: Payer: Managed Care, Other (non HMO)

## 2011-03-30 ENCOUNTER — Other Ambulatory Visit: Payer: Self-pay | Admitting: Internal Medicine

## 2011-03-31 ENCOUNTER — Ambulatory Visit (INDEPENDENT_AMBULATORY_CARE_PROVIDER_SITE_OTHER): Payer: Managed Care, Other (non HMO) | Admitting: *Deleted

## 2011-03-31 DIAGNOSIS — Z23 Encounter for immunization: Secondary | ICD-10-CM

## 2011-04-17 ENCOUNTER — Other Ambulatory Visit: Payer: Self-pay | Admitting: Internal Medicine

## 2011-05-05 ENCOUNTER — Other Ambulatory Visit: Payer: Self-pay | Admitting: Internal Medicine

## 2011-05-07 ENCOUNTER — Telehealth: Payer: Self-pay

## 2011-05-07 MED ORDER — TRAMADOL HCL 50 MG PO TABS: ORAL_TABLET | ORAL | Status: DC

## 2011-05-07 NOTE — Telephone Encounter (Signed)
Please advise if ok to refill tramadol. Thanks 

## 2011-05-07 NOTE — Telephone Encounter (Signed)
yes

## 2011-06-23 ENCOUNTER — Telehealth: Payer: Self-pay | Admitting: Internal Medicine

## 2011-06-23 LAB — HM MAMMOGRAPHY: HM Mammogram: NORMAL

## 2011-06-23 NOTE — Telephone Encounter (Signed)
Received copies from [facility] ,on 06/23/11. Forwarded 1 pages to Dr.Jones,for review.

## 2011-07-03 ENCOUNTER — Other Ambulatory Visit: Payer: Self-pay | Admitting: Internal Medicine

## 2011-07-04 ENCOUNTER — Other Ambulatory Visit: Payer: Self-pay | Admitting: Internal Medicine

## 2011-07-16 DIAGNOSIS — Z0279 Encounter for issue of other medical certificate: Secondary | ICD-10-CM

## 2011-07-21 ENCOUNTER — Ambulatory Visit: Payer: Managed Care, Other (non HMO) | Admitting: Internal Medicine

## 2011-07-21 DIAGNOSIS — Z0289 Encounter for other administrative examinations: Secondary | ICD-10-CM

## 2011-07-24 ENCOUNTER — Other Ambulatory Visit: Payer: Self-pay | Admitting: Orthopedic Surgery

## 2011-07-24 DIAGNOSIS — M7989 Other specified soft tissue disorders: Secondary | ICD-10-CM

## 2011-08-02 ENCOUNTER — Inpatient Hospital Stay: Admission: RE | Admit: 2011-08-02 | Payer: Managed Care, Other (non HMO) | Source: Ambulatory Visit

## 2011-08-04 ENCOUNTER — Other Ambulatory Visit (INDEPENDENT_AMBULATORY_CARE_PROVIDER_SITE_OTHER): Payer: Managed Care, Other (non HMO)

## 2011-08-04 ENCOUNTER — Encounter: Payer: Self-pay | Admitting: Internal Medicine

## 2011-08-04 ENCOUNTER — Ambulatory Visit (INDEPENDENT_AMBULATORY_CARE_PROVIDER_SITE_OTHER): Payer: Managed Care, Other (non HMO) | Admitting: Internal Medicine

## 2011-08-04 VITALS — BP 108/70 | HR 80 | Temp 98.3°F | Resp 16 | Wt 122.8 lb

## 2011-08-04 DIAGNOSIS — E039 Hypothyroidism, unspecified: Secondary | ICD-10-CM

## 2011-08-04 DIAGNOSIS — F329 Major depressive disorder, single episode, unspecified: Secondary | ICD-10-CM

## 2011-08-04 DIAGNOSIS — G4700 Insomnia, unspecified: Secondary | ICD-10-CM

## 2011-08-04 DIAGNOSIS — IMO0001 Reserved for inherently not codable concepts without codable children: Secondary | ICD-10-CM

## 2011-08-04 DIAGNOSIS — E785 Hyperlipidemia, unspecified: Secondary | ICD-10-CM

## 2011-08-04 LAB — CBC WITH DIFFERENTIAL/PLATELET
Basophils Absolute: 0.1 10*3/uL (ref 0.0–0.1)
Basophils Relative: 0.8 % (ref 0.0–3.0)
Eosinophils Absolute: 0.1 10*3/uL (ref 0.0–0.7)
Eosinophils Relative: 1.2 % (ref 0.0–5.0)
HCT: 46.8 % — ABNORMAL HIGH (ref 36.0–46.0)
Hemoglobin: 15.7 g/dL — ABNORMAL HIGH (ref 12.0–15.0)
Lymphocytes Relative: 23.6 % (ref 12.0–46.0)
Lymphs Abs: 2.3 10*3/uL (ref 0.7–4.0)
MCHC: 33.5 g/dL (ref 30.0–36.0)
MCV: 92.6 fl (ref 78.0–100.0)
Monocytes Absolute: 0.7 10*3/uL (ref 0.1–1.0)
Monocytes Relative: 7 % (ref 3.0–12.0)
Neutro Abs: 6.5 10*3/uL (ref 1.4–7.7)
Neutrophils Relative %: 67.4 % (ref 43.0–77.0)
Platelets: 285 10*3/uL (ref 150.0–400.0)
RBC: 5.06 Mil/uL (ref 3.87–5.11)
RDW: 13.6 % (ref 11.5–14.6)
WBC: 9.7 10*3/uL (ref 4.5–10.5)

## 2011-08-04 LAB — COMPREHENSIVE METABOLIC PANEL
ALT: 19 U/L (ref 0–35)
AST: 24 U/L (ref 0–37)
Albumin: 4.2 g/dL (ref 3.5–5.2)
Alkaline Phosphatase: 64 U/L (ref 39–117)
BUN: 13 mg/dL (ref 6–23)
CO2: 27 mEq/L (ref 19–32)
Calcium: 9.5 mg/dL (ref 8.4–10.5)
Chloride: 102 mEq/L (ref 96–112)
Creatinine, Ser: 0.9 mg/dL (ref 0.4–1.2)
GFR: 72.94 mL/min (ref >60.00)
Glucose, Bld: 114 mg/dL — ABNORMAL HIGH (ref 70–99)
Potassium: 4.2 mEq/L (ref 3.5–5.1)
Sodium: 138 mEq/L (ref 135–145)
Total Bilirubin: 0.5 mg/dL (ref 0.3–1.2)
Total Protein: 7.9 g/dL (ref 6.0–8.3)

## 2011-08-04 LAB — LIPID PANEL
Cholesterol: 188 mg/dL (ref 0–200)
HDL: 67.7 mg/dL (ref >39.00)
LDL Cholesterol: 104 mg/dL — ABNORMAL HIGH (ref 0–99)
Total CHOL/HDL Ratio: 3
Triglycerides: 83 mg/dL (ref 0.0–149.0)
VLDL: 16.6 mg/dL (ref 0.0–40.0)

## 2011-08-04 LAB — TSH: TSH: 1.37 u[IU]/mL (ref 0.35–5.50)

## 2011-08-04 MED ORDER — ROSUVASTATIN CALCIUM 20 MG PO TABS: 20.0000 mg | ORAL_TABLET | Freq: Every day | ORAL | Status: DC

## 2011-08-04 MED ORDER — ZOLPIDEM TARTRATE 5 MG PO TABS: 5.0000 mg | ORAL_TABLET | Freq: Every evening | ORAL | Status: DC | PRN

## 2011-08-04 MED ORDER — TRAMADOL HCL (ER BIPHASIC) 300 MG PO TB24: 1.0000 | ORAL_TABLET | Freq: Every day | ORAL | Status: DC

## 2011-08-04 MED ORDER — VALACYCLOVIR HCL 1 G PO TABS: 1000.0000 mg | ORAL_TABLET | Freq: Every day | ORAL | Status: DC

## 2011-08-04 MED ORDER — PAROXETINE HCL 20 MG PO TABS: 20.0000 mg | ORAL_TABLET | ORAL | Status: DC

## 2011-08-04 MED ORDER — LEVOTHYROXINE SODIUM 100 MCG PO TABS: 100.0000 ug | ORAL_TABLET | Freq: Every day | ORAL | Status: DC

## 2011-08-04 MED ORDER — RIZATRIPTAN BENZOATE 10 MG PO TABS: 10.0000 mg | ORAL_TABLET | ORAL | Status: DC | PRN

## 2011-08-04 MED ORDER — VITAMIN D (ERGOCALCIFEROL) 1.25 MG (50000 UNIT) PO CAPS: 50000.0000 [IU] | ORAL_CAPSULE | ORAL | Status: DC

## 2011-08-04 MED ORDER — CLONAZEPAM 0.5 MG PO TABS: 0.5000 mg | ORAL_TABLET | Freq: Every day | ORAL | Status: DC

## 2011-08-04 NOTE — Assessment & Plan Note (Signed)
She wants to change the tramadol to a qd dose so I made that change for her

## 2011-08-04 NOTE — Assessment & Plan Note (Signed)
She is doing well on crestor, I will check her TSH FLP CMP today

## 2011-08-04 NOTE — Assessment & Plan Note (Signed)
She has been taking her husband's supply and she asks me for her own Rx

## 2011-08-04 NOTE — Patient Instructions (Signed)

## 2011-08-04 NOTE — Assessment & Plan Note (Signed)
Treated by Dr. Evelene Croon

## 2011-08-04 NOTE — Assessment & Plan Note (Signed)
I will check her TSH level today 

## 2011-08-04 NOTE — Progress Notes (Signed)
Subjective:    Patient ID: Claudia Luna, female    DOB: [birth year], 56 y.o.   MRN: [id number]  Thyroid Problem Presents for follow-up visit. Symptoms include anxiety. Patient reports no cold intolerance, constipation, depressed mood, diaphoresis, diarrhea, dry skin, fatigue, hair loss, heat intolerance, hoarse voice, leg swelling, nail problem, palpitations, tremors, visual change, weight gain or weight loss. The symptoms have been stable.      Review of Systems  Constitutional: Negative for fever, chills, weight loss, weight gain, diaphoresis, activity change, appetite change, fatigue and unexpected weight change.  HENT: Negative.  Negative for hoarse voice.   Eyes: Negative.   Respiratory: Negative for cough, chest tightness, shortness of breath, wheezing and stridor.   Cardiovascular: Negative for chest pain, palpitations and leg swelling.  Gastrointestinal: Negative for nausea, vomiting, abdominal pain, diarrhea, constipation, blood in stool, abdominal distention and anal bleeding.  Genitourinary: Negative.   Musculoskeletal: Negative for myalgias, back pain, joint swelling, arthralgias and gait problem.  Skin: Negative for color change, pallor, rash and wound.  Neurological: Negative.  Negative for tremors.  Hematological: Negative for cold intolerance, heat intolerance and adenopathy. Does not bruise/bleed easily.  Psychiatric/Behavioral: Positive for disturbed wake/sleep cycle (some DFA). Negative for suicidal ideas, hallucinations, behavioral problems, confusion, self-injury, dysphoric mood, decreased concentration and agitation. The patient is nervous/anxious. The patient is not hyperactive.        Objective:   Physical Exam  Vitals reviewed. Constitutional: She is oriented to person, place, and time. She appears well-developed and well-nourished. No distress.  HENT:  Head: Normocephalic and atraumatic.  Mouth/Throat: Oropharynx is clear and moist. No oropharyngeal  exudate.  Eyes: Conjunctivae are normal. Right eye exhibits no discharge. Left eye exhibits no discharge. No scleral icterus.  Neck: Normal range of motion. Neck supple. No JVD present. No tracheal deviation present. No thyromegaly present.  Cardiovascular: Normal rate, regular rhythm, normal heart sounds and intact distal pulses.  Exam reveals no gallop and no friction rub.   No murmur heard. Pulmonary/Chest: Effort normal and breath sounds normal. No stridor. No respiratory distress. She has no wheezes. She has no rales. She exhibits no tenderness.  Abdominal: Soft. Bowel sounds are normal. She exhibits no distension and no mass. There is no tenderness. There is no rebound and no guarding.  Musculoskeletal: Normal range of motion. She exhibits no edema and no tenderness.  Lymphadenopathy:    She has no cervical adenopathy.  Neurological: She is oriented to person, place, and time.  Skin: Skin is warm and dry. No rash noted. She is not diaphoretic. No erythema. No pallor.  Psychiatric: Her behavior is normal. Judgment and thought content normal. Her mood appears anxious. Her affect is not angry, not blunt, not labile and not inappropriate. Her speech is not rapid and/or pressured, not delayed, not tangential and not slurred. Cognition and memory are normal. Cognition and memory are not impaired. She does not express impulsivity or inappropriate judgment. She exhibits a depressed mood (tearful). She is communicative. She exhibits normal remote memory.      Lab Results  Component Value Date   WBC 5.8 04/04/2009   HGB 14.8 04/04/2009   HCT 44.2 04/04/2009   PLT 280.0 04/04/2009   GLUCOSE 95 04/04/2009   CHOL 193 06/27/2008   TRIG 232.0* 06/27/2008   HDL 65.50 06/27/2008   LDLDIRECT 112.3 06/27/2008   ALT 34 04/04/2009   AST 23 04/04/2009   NA 140 04/04/2009   K 3.7 04/04/2009   CL 102 04/04/2009  CREATININE 0.6 04/04/2009   BUN 5* 04/04/2009   CO2 30 04/04/2009   TSH 1.06 04/04/2009      Assessment & Plan:

## 2011-08-09 ENCOUNTER — Ambulatory Visit
Admission: RE | Admit: 2011-08-09 | Discharge: 2011-08-09 | Disposition: A | Discharge status: Home / Self Care | Payer: 59 | Source: Ambulatory Visit | Attending: Orthopedic Surgery | Admitting: Orthopedic Surgery

## 2011-08-09 DIAGNOSIS — M7989 Other specified soft tissue disorders: Secondary | ICD-10-CM

## 2011-08-09 MED ORDER — GADOBENATE DIMEGLUMINE 529 MG/ML IV SOLN
10.0000 mL | Freq: Once | INTRAVENOUS | Status: AC | PRN
  Administered 2011-08-09: 10 mL via INTRAVENOUS

## 2011-09-11 ENCOUNTER — Telehealth: Payer: Self-pay

## 2011-09-11 DIAGNOSIS — G4700 Insomnia, unspecified: Secondary | ICD-10-CM

## 2011-09-11 MED ORDER — ZOLPIDEM TARTRATE 5 MG PO TABS: 5.0000 mg | ORAL_TABLET | Freq: Every evening | ORAL | Status: DC | PRN

## 2011-09-11 NOTE — Telephone Encounter (Signed)
Please advise #15 or #30 Thanks

## 2011-09-11 NOTE — Telephone Encounter (Signed)
30

## 2011-09-11 NOTE — Telephone Encounter (Signed)
yes

## 2011-09-11 NOTE — Telephone Encounter (Signed)
Please advise if ok to refill zolpidem 5 mg. Patient last seen 08/04/11 and med last filled 08/04/11 but #15 only

## 2011-10-15 DIAGNOSIS — G43709 Chronic migraine without aura, not intractable, without status migrainosus: Secondary | ICD-10-CM | POA: Insufficient documentation

## 2011-11-13 ENCOUNTER — Other Ambulatory Visit: Payer: Self-pay | Admitting: Internal Medicine

## 2011-11-28 ENCOUNTER — Telehealth: Payer: Self-pay

## 2011-11-28 MED ORDER — ZOSTER VACCINE LIVE 19400 UNT/0.65ML ~~LOC~~ SOLR: 0.6500 mL | Freq: Once | SUBCUTANEOUS | Status: DC

## 2011-11-28 NOTE — Telephone Encounter (Signed)
Rx sent escript 

## 2011-11-28 NOTE — Telephone Encounter (Signed)
yes

## 2011-11-28 NOTE — Telephone Encounter (Signed)
Received fax from pharmacy requesting rx for zostavax 0.65. Pleas e advise if ok to send. Thanks

## 2011-12-15 ENCOUNTER — Other Ambulatory Visit: Payer: Self-pay | Admitting: Internal Medicine

## 2011-12-22 ENCOUNTER — Telehealth: Payer: Self-pay | Admitting: Internal Medicine

## 2011-12-22 ENCOUNTER — Ambulatory Visit: Payer: 59 | Admitting: Internal Medicine

## 2011-12-22 NOTE — Telephone Encounter (Signed)
yes

## 2011-12-22 NOTE — Telephone Encounter (Signed)
Pts spouse called and is req to change pts pcp from Dr Yetta Barre to Dr Artist Pais. Pls advise is ok.

## 2011-12-22 NOTE — Telephone Encounter (Signed)
Ok with me 

## 2011-12-22 NOTE — Telephone Encounter (Signed)
Spoke to pts spouse and sch pt a acute ov to see Dr Artist Pais tomorrow 12/23/11. Ok per Dr Artist Pais to sch pt for acute ov.

## 2011-12-23 ENCOUNTER — Ambulatory Visit (INDEPENDENT_AMBULATORY_CARE_PROVIDER_SITE_OTHER): Payer: 59 | Admitting: Internal Medicine

## 2011-12-23 ENCOUNTER — Ambulatory Visit (INDEPENDENT_AMBULATORY_CARE_PROVIDER_SITE_OTHER)
Admission: RE | Admit: 2011-12-23 | Discharge: 2011-12-23 | Disposition: A | Discharge status: Home / Self Care | Payer: 59 | Source: Ambulatory Visit | Attending: Internal Medicine | Admitting: Internal Medicine

## 2011-12-23 ENCOUNTER — Encounter: Payer: Self-pay | Admitting: Internal Medicine

## 2011-12-23 VITALS — BP 104/76 | HR 116 | Temp 98.1°F | Wt 126.0 lb

## 2011-12-23 DIAGNOSIS — IMO0001 Reserved for inherently not codable concepts without codable children: Secondary | ICD-10-CM

## 2011-12-23 DIAGNOSIS — B370 Candidal stomatitis: Secondary | ICD-10-CM | POA: Insufficient documentation

## 2011-12-23 DIAGNOSIS — R079 Chest pain, unspecified: Secondary | ICD-10-CM

## 2011-12-23 DIAGNOSIS — R071 Chest pain on breathing: Secondary | ICD-10-CM

## 2011-12-23 DIAGNOSIS — R0789 Other chest pain: Secondary | ICD-10-CM | POA: Insufficient documentation

## 2011-12-23 LAB — CBC WITH DIFFERENTIAL/PLATELET
Basophils Absolute: 0.1 10*3/uL (ref 0.0–0.1)
Basophils Relative: 0.6 % (ref 0.0–3.0)
Eosinophils Absolute: 0.1 10*3/uL (ref 0.0–0.7)
Eosinophils Relative: 1.1 % (ref 0.0–5.0)
HCT: 44.3 % (ref 36.0–46.0)
Hemoglobin: 14.8 g/dL (ref 12.0–15.0)
Lymphocytes Relative: 22.8 % (ref 12.0–46.0)
Lymphs Abs: 2 10*3/uL (ref 0.7–4.0)
MCHC: 33.3 g/dL (ref 30.0–36.0)
MCV: 93 fl (ref 78.0–100.0)
Monocytes Absolute: 0.7 10*3/uL (ref 0.1–1.0)
Monocytes Relative: 7.6 % (ref 3.0–12.0)
Neutro Abs: 5.9 10*3/uL (ref 1.4–7.7)
Neutrophils Relative %: 67.9 % (ref 43.0–77.0)
Platelets: 311 10*3/uL (ref 150.0–400.0)
RBC: 4.77 Mil/uL (ref 3.87–5.11)
RDW: 13.8 % (ref 11.5–14.6)
WBC: 8.7 10*3/uL (ref 4.5–10.5)

## 2011-12-23 LAB — BASIC METABOLIC PANEL
BUN: 8 mg/dL (ref 6–23)
CO2: 26 mEq/L (ref 19–32)
Calcium: 8.9 mg/dL (ref 8.4–10.5)
Chloride: 102 mEq/L (ref 96–112)
Creatinine, Ser: 0.7 mg/dL (ref 0.4–1.2)
GFR: 88.01 mL/min (ref >60.00)
Glucose, Bld: 95 mg/dL (ref 70–99)
Potassium: 3.3 mEq/L — ABNORMAL LOW (ref 3.5–5.1)
Sodium: 135 mEq/L (ref 135–145)

## 2011-12-23 LAB — D-DIMER, QUANTITATIVE: D-Dimer, Quant: 0.46 ug/mL-FEU (ref 0.00–0.48)

## 2011-12-23 NOTE — Patient Instructions (Addendum)
Our office will contact re: blood test and CXR results.

## 2011-12-23 NOTE — Assessment & Plan Note (Signed)
It is unclear whether her current symptoms represents unusual flare of her fibromyalgia.

## 2011-12-23 NOTE — Progress Notes (Signed)
Subjective:    Patient ID: Claudia Luna, female    DOB: [birth year], 56 y.o.   MRN: [id number]  HPI  56 year old white female with history of fibromyalgia, depression, chronic insomnia, and hypothyroidism complains of not feeling well over last 1 month. Patient reports this all started about a year ago when she had a bad fall. Over last several days patient reports significant pain with doing minor activities. She reports "it hurts to move my fingers". Patient reports her left neck felt swollen and she had associated left ear pain. She thought she might have had an upper respiratory infection and took azithromycin on her own. She got her prescription of antibiotics from her husband in [place].   She has had hacking cough and feels unusually tired. Patient reports her symptoms have improved but now she has left upper chest pain. She finds it hard to take a deep breath. She has mild shortness of breath. She also reports slight wheezing. She had low-grade fever last week but no documented fever.  Patient also reports episode of thrush in March, 2013.  She denies use of antibiotics before episode of thrush.  Review of Systems Left sided chest pain worse with inhalation and cough.  Negative for fever  Past Medical History  Diagnosis Date  . Thyroid disease     hypothyroid  . Anxiety   . Depression   . Asthma   . Hyperlipidemia   . Headache   . Chronic kidney disease     kidney stones, history of  . Fibromyalgia   . Gout     History   Social History  . Marital Status: Married    Spouse Name: Merlyn Albert    Number of Children: 4  . Years of Education: N/A   Occupational History  . Freight forwarder    Social History Main Topics  . Smoking status: Never Smoker   . Smokeless tobacco: Never Used  . Alcohol Use: No  . Drug Use: No  . Sexually Active: Yes   Other Topics Concern  . Not on file   Social History Narrative   Regular exercise: yes    Past Surgical  History  Procedure Date  . Breast surgery 1993    reduction  . Abdominal hysterectomy   . Joint replacement     total knee  . Appendectomy   . Cholecystectomy     Family History  Problem Relation Age of Onset  . Cancer Mother     breast  . Stroke Mother     "mini"  . Coronary artery disease Father     status post CABG  . Peripheral vascular disease Father   . Hypertension Father   . Hyperlipidemia Father     Allergies  Allergen Reactions  . Codeine     REACTION: VOMITING  . Penicillins     REACTION: RASH  . Pregabalin     REACTION: swelling and weight gain    Current Outpatient Prescriptions on File Prior to Visit  Medication Sig Dispense Refill  . amphetamine-dextroamphetamine (ADDERALL) 20 MG tablet Take 20 mg by mouth 3 (three) times daily.      . clonazePAM (KLONOPIN) 0.5 MG tablet Take 1 tablet (0.5 mg total) by mouth at bedtime.  30 tablet  5  . estradiol (VIVELLE-DOT) 0.1 MG/24HR Place 1 patch onto the skin 2 (two) times a week.       . levothyroxine (SYNTHROID, LEVOTHROID) 100 MCG tablet Take 1 tablet (100 mcg total) by  mouth daily.  30 tablet  5  . PARoxetine (PAXIL) 20 MG tablet Take 1 tablet (20 mg total) by mouth every morning.  30 tablet  11  . rizatriptan (MAXALT) 10 MG tablet Take 1 tablet (10 mg total) by mouth as needed for migraine. May repeat in 2 hours if needed  10 tablet  11  . rosuvastatin (CRESTOR) 20 MG tablet Take 1 tablet (20 mg total) by mouth daily.  30 tablet  11  . traMADol (ULTRAM) 50 MG tablet TAKE TWO TABLETS BY MOUTH EVERY FOUR TO SIX HOURS AS NEEDED  120 tablet  0  . TraMADol HCl 300 MG TB24 Take 1 tablet by mouth daily.  30 tablet  11  . valACYclovir (VALTREX) 1000 MG tablet Take 1 tablet (1,000 mg total) by mouth daily.  30 tablet  11  . Vitamin D, Ergocalciferol, (DRISDOL) 50000 UNITS CAPS Take 1 capsule (50,000 Units total) by mouth every 7 (seven) days.  30 capsule  11  . zolpidem (AMBIEN) 5 MG tablet Take 1 tablet (5 mg total)  by mouth at bedtime as needed for sleep.  30 tablet  5  . zoster vaccine live, PF, (ZOSTAVAX) 16109 UNT/0.65ML injection Inject 19,400 Units into the skin once.  1 each  0    BP 104/76  Pulse 116  Temp 98.1 F (36.7 C) (Oral)  Wt 126 lb (57.153 kg)  SpO2 96%  EKG shows normal sinus rhythm at 80 beats per minute. Short PR syndrome.      Objective:   Physical Exam  Constitutional: She is oriented to person, place, and time. She appears well-developed and well-nourished.  HENT:  Head: Normocephalic and atraumatic.  Right Ear: External ear normal.  Left Ear: External ear normal.  Mouth/Throat: Oropharynx is clear and moist.  Eyes: Conjunctivae normal and EOM are normal. Pupils are equal, round, and reactive to light.  Neck: Neck supple.       Mild left sided neck tenderness  Cardiovascular: Regular rhythm and normal heart sounds.  Exam reveals no gallop.   No murmur heard.      Tachycardic  Pulmonary/Chest: Effort normal and breath sounds normal. She has no wheezes.       Left sternal chest tenderness  Abdominal: Soft. Bowel sounds are normal. There is no tenderness.  Musculoskeletal:       Trace lower extremity edema bilaterally No calf tenderness  Neurological: She is alert and oriented to person, place, and time. No cranial nerve deficit.  Skin: Skin is warm and dry.  Psychiatric: She has a normal mood and affect. Her behavior is normal.          Assessment & Plan:

## 2011-12-23 NOTE — Assessment & Plan Note (Signed)
56 year old white female with progressive left chest wall pain over last 1 week. Her symptoms are worse with deep inhalation. She has mild shortness of breath. She is tachycardic on exam. Obtain chest x-ray. Her lung exam is normal. Rule out atypical pneumonia. Also consider possibility of pulmonary embolism. Obtain STAT d-dimer.

## 2011-12-23 NOTE — Assessment & Plan Note (Signed)
Patient reports a bout of thrush in March 2013. No obvious trigger. Check HIV antibody.

## 2011-12-24 ENCOUNTER — Ambulatory Visit: Payer: 59 | Admitting: Internal Medicine

## 2011-12-24 LAB — HIV ANTIBODY (ROUTINE TESTING W REFLEX): HIV: NONREACTIVE

## 2011-12-29 ENCOUNTER — Telehealth: Payer: Self-pay | Admitting: Internal Medicine

## 2011-12-29 NOTE — Telephone Encounter (Signed)
Note up front ready for p/u, l/m on pts cell phone

## 2011-12-29 NOTE — Telephone Encounter (Signed)
Pts spouse called and said that pt needs a doctors note from Dr Artist Pais. Pt missed work all last week and returned to work today. Pt is feeling a little better. Pts spouse will pick up letter when ready. Pls call.

## 2011-12-31 ENCOUNTER — Ambulatory Visit (INDEPENDENT_AMBULATORY_CARE_PROVIDER_SITE_OTHER): Payer: 59 | Admitting: Internal Medicine

## 2011-12-31 ENCOUNTER — Encounter: Payer: Self-pay | Admitting: Internal Medicine

## 2011-12-31 VITALS — BP 100/70 | HR 80 | Temp 98.0°F | Resp 16 | Ht 61.0 in | Wt 129.0 lb

## 2011-12-31 DIAGNOSIS — R0789 Other chest pain: Secondary | ICD-10-CM

## 2011-12-31 DIAGNOSIS — R071 Chest pain on breathing: Secondary | ICD-10-CM

## 2011-12-31 DIAGNOSIS — R05 Cough: Secondary | ICD-10-CM

## 2011-12-31 DIAGNOSIS — R059 Cough, unspecified: Secondary | ICD-10-CM

## 2011-12-31 MED ORDER — MOMETASONE FUROATE 50 MCG/ACT NA SUSP: 2.0000 | Freq: Every day | NASAL | Status: DC

## 2011-12-31 NOTE — Assessment & Plan Note (Signed)
I suspect patient's dry cough secondary to post viral etiology. Patient advised to use over-the-counter intranasal saline as well as Nasonex.  Patient advised to call office if symptoms persist or worsen.

## 2011-12-31 NOTE — Patient Instructions (Addendum)
Please call our office if your symptoms do not improve or gets worse. Use intranasal saline spray 3-4 times per day

## 2011-12-31 NOTE — Assessment & Plan Note (Signed)
Patient's chest x-ray and d-dimer are normal. She has tenderness on exam. Her symptoms consistent with costochondritis. Her symptoms significantly improved with Celebrex 200 mg for 9 days. Patient given additional 3 doses.  Patient advised to call office if symptoms persist or worsen.

## 2011-12-31 NOTE — Progress Notes (Signed)
Subjective:    Patient ID: Claudia Luna, female    DOB: 1955-12-23, 56 y.o.   MRN: [id number]  HPI  56 year old white female for followup regarding left-sided chest wall pain and cough. Patient's chest x-ray was normal. Her CBC, electrolytes, kidney function and d-dimer were all normal as well. Patient started on Celebrex 200 mg once daily for presumed costochondritis. Patient reports her symptoms significantly improved after starting anti-inflammatories. She has mild residual left chest wall tenderness.  She still has persistent dry cough. She denies shortness of breath. She denies any sinus pressure or pain.  Review of Systems Negative for fever chills, negative for exertional chest pain  Past Medical History  Diagnosis Date  . Thyroid disease     hypothyroid  . Anxiety   . Depression   . Asthma   . Hyperlipidemia   . Headache   . Chronic kidney disease     kidney stones, history of  . Fibromyalgia   . Gout     History   Social History  . Marital Status: Married    Spouse Name: Merlyn Albert    Number of Children: 4  . Years of Education: N/A   Occupational History  . Freight forwarder    Social History Main Topics  . Smoking status: Never Smoker   . Smokeless tobacco: Never Used  . Alcohol Use: No  . Drug Use: No  . Sexually Active: Yes   Other Topics Concern  . Not on file   Social History Narrative   Regular exercise: yes    Past Surgical History  Procedure Date  . Breast surgery 1993    reduction  . Abdominal hysterectomy   . Joint replacement     total knee  . Appendectomy   . Cholecystectomy     Family History  Problem Relation Age of Onset  . Cancer Mother     breast  . Stroke Mother     "mini"  . Coronary artery disease Father     status post CABG  . Peripheral vascular disease Father   . Hypertension Father   . Hyperlipidemia Father     Allergies  Allergen Reactions  . Codeine     REACTION: VOMITING  . Penicillins       REACTION: RASH  . Pregabalin     REACTION: swelling and weight gain    Current Outpatient Prescriptions on File Prior to Visit  Medication Sig Dispense Refill  . amphetamine-dextroamphetamine (ADDERALL) 20 MG tablet Take 20 mg by mouth 3 (three) times daily.      . clonazePAM (KLONOPIN) 0.5 MG tablet Take 1 tablet (0.5 mg total) by mouth at bedtime.  30 tablet  5  . estradiol (VIVELLE-DOT) 0.1 MG/24HR Place 1 patch onto the skin 2 (two) times a week.       . levothyroxine (SYNTHROID, LEVOTHROID) 100 MCG tablet Take 1 tablet (100 mcg total) by mouth daily.  30 tablet  5  . mometasone (NASONEX) 50 MCG/ACT nasal spray Place 2 sprays into the nose daily.  17 g  3  . PARoxetine (PAXIL) 20 MG tablet Take 1 tablet (20 mg total) by mouth every morning.  30 tablet  11  . rizatriptan (MAXALT) 10 MG tablet Take 1 tablet (10 mg total) by mouth as needed for migraine. May repeat in 2 hours if needed  10 tablet  11  . rosuvastatin (CRESTOR) 20 MG tablet Take 1 tablet (20 mg total) by mouth daily.  30 tablet  11  . traMADol (ULTRAM) 50 MG tablet TAKE TWO TABLETS BY MOUTH EVERY FOUR TO SIX HOURS AS NEEDED  120 tablet  0  . valACYclovir (VALTREX) 1000 MG tablet Take 1 tablet (1,000 mg total) by mouth daily.  30 tablet  11  . Vitamin D, Ergocalciferol, (DRISDOL) 50000 UNITS CAPS Take 1 capsule (50,000 Units total) by mouth every 7 (seven) days.  30 capsule  11  . zolpidem (AMBIEN) 5 MG tablet Take 1 tablet (5 mg total) by mouth at bedtime as needed for sleep.  30 tablet  5  . zoster vaccine live, PF, (ZOSTAVAX) 40347 UNT/0.65ML injection Inject 19,400 Units into the skin once.  1 each  0    BP 100/70  Pulse 80  Temp 98 F (36.7 C)  Resp 16  Ht 5\' 1"  (1.549 m)  Wt 129 lb (58.514 kg)  BMI 24.37 kg/m2       Objective:   Physical Exam  Constitutional: She is oriented to person, place, and time. She appears well-developed and well-nourished.  HENT:  Head: Normocephalic and atraumatic.  Right Ear:  External ear normal.  Neck: Neck supple.       No neck tenderness  Cardiovascular: Normal rate, regular rhythm and normal heart sounds.   Pulmonary/Chest: Effort normal and breath sounds normal. She has no wheezes.  Neurological: She is alert and oriented to person, place, and time.  Psychiatric: She has a normal mood and affect. Her behavior is normal.          Assessment & Plan:

## 2012-01-14 ENCOUNTER — Other Ambulatory Visit: Payer: Self-pay | Admitting: Internal Medicine

## 2012-02-09 ENCOUNTER — Telehealth: Payer: Self-pay | Admitting: *Deleted

## 2012-02-09 NOTE — Telephone Encounter (Signed)
Refill tramadol 50 mg 2 tabs q4-6 hours prn

## 2012-02-09 NOTE — Telephone Encounter (Signed)
Who was original prescribing physician.  I did not prescribe at last OV.

## 2012-02-10 NOTE — Telephone Encounter (Signed)
plz send refill refill request to Dr. Yetta Barre

## 2012-02-10 NOTE — Telephone Encounter (Signed)
Scarlette Calico, MD

## 2012-02-11 MED ORDER — TRAMADOL HCL 50 MG PO TABS: 50.0000 mg | ORAL_TABLET | Freq: Four times a day (QID) | ORAL | Status: DC | PRN

## 2012-02-11 NOTE — Telephone Encounter (Signed)
Refill x1 only 

## 2012-02-11 NOTE — Addendum Note (Signed)
Addended by: Alfred Levins D on: 02/11/2012 04:25 PM   Modules accepted: Orders

## 2012-02-11 NOTE — Telephone Encounter (Signed)
rx sent in electronically 

## 2012-02-11 NOTE — Telephone Encounter (Signed)
I am not seeing her anymore

## 2012-03-01 ENCOUNTER — Ambulatory Visit: Payer: 59 | Admitting: Internal Medicine

## 2012-03-12 ENCOUNTER — Other Ambulatory Visit: Payer: Self-pay | Admitting: Internal Medicine

## 2012-03-16 ENCOUNTER — Other Ambulatory Visit: Payer: Self-pay | Admitting: *Deleted

## 2012-03-16 DIAGNOSIS — G4700 Insomnia, unspecified: Secondary | ICD-10-CM

## 2012-03-16 MED ORDER — CLONAZEPAM 0.5 MG PO TABS: 0.5000 mg | ORAL_TABLET | Freq: Every day | ORAL | Status: DC

## 2012-04-12 ENCOUNTER — Other Ambulatory Visit: Payer: Self-pay | Admitting: Internal Medicine

## 2012-04-13 ENCOUNTER — Ambulatory Visit (INDEPENDENT_AMBULATORY_CARE_PROVIDER_SITE_OTHER): Payer: 59 | Admitting: Internal Medicine

## 2012-04-13 ENCOUNTER — Encounter: Payer: Self-pay | Admitting: Internal Medicine

## 2012-04-13 VITALS — BP 102/70 | Temp 97.9°F | Wt 129.0 lb

## 2012-04-13 DIAGNOSIS — R05 Cough: Secondary | ICD-10-CM | POA: Insufficient documentation

## 2012-04-13 DIAGNOSIS — R058 Other specified cough: Secondary | ICD-10-CM | POA: Insufficient documentation

## 2012-04-13 DIAGNOSIS — J329 Chronic sinusitis, unspecified: Secondary | ICD-10-CM

## 2012-04-13 MED ORDER — DOXYCYCLINE HYCLATE 100 MG PO TABS: 100.0000 mg | ORAL_TABLET | Freq: Two times a day (BID) | ORAL | Status: DC

## 2012-04-13 MED ORDER — HYDROCODONE-HOMATROPINE 5-1.5 MG/5ML PO SYRP: 5.0000 mL | ORAL_SOLUTION | Freq: Three times a day (TID) | ORAL | Status: DC | PRN

## 2012-04-13 NOTE — Assessment & Plan Note (Signed)
57 year old white female presents with possible symptoms of chronic sinusitis. Treat with doxycycline 100 mg twice daily for 10 days.  Patient advised to call office if symptoms persist or worsen.

## 2012-04-13 NOTE — Progress Notes (Signed)
Subjective:    Patient ID: Claudia Luna, female    DOB: 08/11/1955, 57 y.o.   MRN: [id number]  HPI  57 year old white female complains of chronic postnasal drip, sinus congestion and popping sensation in her ears. She has tried multiple over-the-counter agents without any improvement. She denies fever or chills. Her cough is nonproductive.  Review of Systems Fatigue  Past Medical History  Diagnosis Date  . Thyroid disease     hypothyroid  . Anxiety   . Depression   . Asthma   . Hyperlipidemia   . Headache   . Chronic kidney disease     kidney stones, history of  . Fibromyalgia   . Gout     History   Social History  . Marital Status: Married    Spouse Name: Merlyn Albert    Number of Children: 4  . Years of Education: N/A   Occupational History  . Freight forwarder    Social History Main Topics  . Smoking status: Never Smoker   . Smokeless tobacco: Never Used  . Alcohol Use: No  . Drug Use: No  . Sexually Active: Yes   Other Topics Concern  . Not on file   Social History Narrative   Regular exercise: yes    Past Surgical History  Procedure Laterality Date  . Breast surgery  1993    reduction  . Abdominal hysterectomy    . Joint replacement      total knee  . Appendectomy    . Cholecystectomy      Family History  Problem Relation Age of Onset  . Cancer Mother     breast  . Stroke Mother     "mini"  . Coronary artery disease Father     status post CABG  . Peripheral vascular disease Father   . Hypertension Father   . Hyperlipidemia Father     Allergies  Allergen Reactions  . Codeine     REACTION: VOMITING  . Penicillins     REACTION: RASH  . Pregabalin     REACTION: swelling and weight gain    Current Outpatient Prescriptions on File Prior to Visit  Medication Sig Dispense Refill  . amphetamine-dextroamphetamine (ADDERALL) 20 MG tablet Take 20 mg by mouth 3 (three) times daily.      . clonazePAM (KLONOPIN) 0.5 MG tablet  Take 1 tablet (0.5 mg total) by mouth at bedtime.  30 tablet  5  . estradiol (VIVELLE-DOT) 0.1 MG/24HR Place 1 patch onto the skin 2 (two) times a week.       . levothyroxine (SYNTHROID, LEVOTHROID) 100 MCG tablet Take 1 tablet (100 mcg total) by mouth daily.  30 tablet  5  . mometasone (NASONEX) 50 MCG/ACT nasal spray Place 2 sprays into the nose daily.  17 g  3  . PARoxetine (PAXIL) 20 MG tablet Take 1 tablet (20 mg total) by mouth every morning.  30 tablet  11  . rizatriptan (MAXALT) 10 MG tablet Take 1 tablet (10 mg total) by mouth as needed for migraine. May repeat in 2 hours if needed  10 tablet  11  . rosuvastatin (CRESTOR) 20 MG tablet Take 1 tablet (20 mg total) by mouth daily.  30 tablet  11  . traMADol (ULTRAM) 50 MG tablet TAKE 1 TABLET BY MOUTH EVERY 6 HOURS AS NEEDED FOR PAIN  120 tablet  0  . valACYclovir (VALTREX) 1000 MG tablet Take 1 tablet (1,000 mg total) by mouth daily.  30 tablet  11  . Vitamin D, Ergocalciferol, (DRISDOL) 50000 UNITS CAPS Take 1 capsule (50,000 Units total) by mouth every 7 (seven) days.  30 capsule  11  . zolpidem (AMBIEN) 5 MG tablet Take 1 tablet (5 mg total) by mouth at bedtime as needed for sleep.  30 tablet  5   No current facility-administered medications on file prior to visit.    BP 102/70  Temp(Src) 97.9 F (36.6 C) (Oral)  Wt 129 lb (58.514 kg)  BMI 24.39 kg/m2       Objective:   Physical Exam  Constitutional: She appears well-developed and well-nourished.  HENT:  Head: Normocephalic and atraumatic.  Oropharyngeal erythema with signs of postnasal drip  Cardiovascular: Normal rate, regular rhythm and normal heart sounds.   No murmur heard. Pulmonary/Chest: Effort normal and breath sounds normal. She has no wheezes.          Assessment & Plan:

## 2012-04-20 ENCOUNTER — Telehealth: Payer: Self-pay | Admitting: Internal Medicine

## 2012-04-20 MED ORDER — BENZONATATE 100 MG PO CAPS: 100.0000 mg | ORAL_CAPSULE | Freq: Three times a day (TID) | ORAL | Status: DC | PRN

## 2012-04-20 MED ORDER — CEFUROXIME AXETIL 500 MG PO TABS: 500.0000 mg | ORAL_TABLET | Freq: Two times a day (BID) | ORAL | Status: DC

## 2012-04-20 NOTE — Telephone Encounter (Signed)
Change abx to cefuroxime 500 mg # 30 one po bid.  No RF.  Call in tessalon perles 100 mg # 30 one po tid prn RF x1.

## 2012-04-20 NOTE — Telephone Encounter (Signed)
Left message on machine for patient and Rx sent/called into office.

## 2012-04-20 NOTE — Telephone Encounter (Signed)
Pt states the antibiotic rx'd last week has had no effect, pt not better. Upper respiratory, constant cough. Cough med w/ codeine causes pt to itch at night and she cannot take.  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

## 2012-04-27 ENCOUNTER — Ambulatory Visit: Payer: 59 | Admitting: Internal Medicine

## 2012-05-04 ENCOUNTER — Ambulatory Visit (INDEPENDENT_AMBULATORY_CARE_PROVIDER_SITE_OTHER): Payer: 59 | Admitting: Internal Medicine

## 2012-05-04 ENCOUNTER — Encounter: Payer: Self-pay | Admitting: Internal Medicine

## 2012-05-04 VITALS — BP 116/74 | Temp 98.3°F | Wt 134.0 lb

## 2012-05-04 DIAGNOSIS — J329 Chronic sinusitis, unspecified: Secondary | ICD-10-CM

## 2012-05-04 MED ORDER — BENZONATATE 100 MG PO CAPS: 100.0000 mg | ORAL_CAPSULE | Freq: Three times a day (TID) | ORAL | Status: DC | PRN

## 2012-05-04 NOTE — Progress Notes (Signed)
Subjective:    Patient ID: Claudia Luna, female    DOB: April 20, 1955, 57 y.o.   MRN: [id number]  HPI  57 year old female previously seen for possible sinusitis for followup. She was initially started on doxycycline 100 mg twice daily. Patient did not have any improvement. She was switched to cefuroxime 500 mg twice daily. She is finishing her 10 day course. Patient reports her sinus symptoms have improved approximately 60%. She still has bilateral maxillary sinus pain and postnasal drip. Her symptoms seem to be worse on her right side.  Review of Systems Negative for fever   Past Medical History  Diagnosis Date  . Thyroid disease     hypothyroid  . Anxiety   . Depression   . Asthma   . Hyperlipidemia   . Headache   . Chronic kidney disease     kidney stones, history of  . Fibromyalgia   . Gout     History   Social History  . Marital Status: Married    Spouse Name: Merlyn Albert    Number of Children: 4  . Years of Education: N/A   Occupational History  . Freight forwarder    Social History Main Topics  . Smoking status: Never Smoker   . Smokeless tobacco: Never Used  . Alcohol Use: No  . Drug Use: No  . Sexually Active: Yes   Other Topics Concern  . Not on file   Social History Narrative   Regular exercise: yes    Past Surgical History  Procedure Laterality Date  . Breast surgery  1993    reduction  . Abdominal hysterectomy    . Joint replacement      total knee  . Appendectomy    . Cholecystectomy      Family History  Problem Relation Age of Onset  . Cancer Mother     breast  . Stroke Mother     "mini"  . Coronary artery disease Father     status post CABG  . Peripheral vascular disease Father   . Hypertension Father   . Hyperlipidemia Father     Allergies  Allergen Reactions  . Codeine     REACTION: VOMITING  . Penicillins     REACTION: RASH  . Pregabalin     REACTION: swelling and weight gain    Current Outpatient  Prescriptions on File Prior to Visit  Medication Sig Dispense Refill  . amphetamine-dextroamphetamine (ADDERALL) 20 MG tablet Take 20 mg by mouth 2 (two) times daily.       . cefUROXime (CEFTIN) 500 MG tablet Take 1 tablet (500 mg total) by mouth 2 (two) times daily.  30 tablet  0  . clonazePAM (KLONOPIN) 0.5 MG tablet Take 1 tablet (0.5 mg total) by mouth at bedtime.  30 tablet  5  . estradiol (VIVELLE-DOT) 0.1 MG/24HR Place 1 patch onto the skin 2 (two) times a week.       . levothyroxine (SYNTHROID, LEVOTHROID) 100 MCG tablet Take 1 tablet (100 mcg total) by mouth daily.  30 tablet  5  . mometasone (NASONEX) 50 MCG/ACT nasal spray Place 2 sprays into the nose daily.  17 g  3  . PARoxetine (PAXIL) 20 MG tablet Take 1 tablet (20 mg total) by mouth every morning.  30 tablet  11  . rizatriptan (MAXALT) 10 MG tablet Take 1 tablet (10 mg total) by mouth as needed for migraine. May repeat in 2 hours if needed  10 tablet  11  .  rosuvastatin (CRESTOR) 20 MG tablet Take 1 tablet (20 mg total) by mouth daily.  30 tablet  11  . traMADol (ULTRAM) 50 MG tablet TAKE 1 TABLET BY MOUTH EVERY 6 HOURS AS NEEDED FOR PAIN  120 tablet  0  . valACYclovir (VALTREX) 1000 MG tablet Take 1 tablet (1,000 mg total) by mouth daily.  30 tablet  11  . Vitamin D, Ergocalciferol, (DRISDOL) 50000 UNITS CAPS Take 1 capsule (50,000 Units total) by mouth every 7 (seven) days.  30 capsule  11  . zolpidem (AMBIEN) 5 MG tablet Take 1 tablet (5 mg total) by mouth at bedtime as needed for sleep.  30 tablet  5   No current facility-administered medications on file prior to visit.    BP 116/74  Temp(Src) 98.3 F (36.8 C) (Oral)  Wt 134 lb (60.782 kg)  BMI 25.33 kg/m2       Objective:   Physical Exam  Constitutional: She is oriented to person, place, and time. She appears well-developed and well-nourished.  HENT:  Head: Normocephalic and atraumatic.  Oropharyngeal erythema right greater than left with signs of postnasal drip   Cardiovascular: Normal rate and regular rhythm.   Pulmonary/Chest: Effort normal and breath sounds normal. She has no wheezes.  Neurological: She is alert and oriented to person, place, and time. No cranial nerve deficit.          Assessment & Plan:

## 2012-05-04 NOTE — Patient Instructions (Addendum)
Use intranasal saline spray 3-4 times per day Our office will contact you re: CT of sinuses results

## 2012-05-04 NOTE — Assessment & Plan Note (Addendum)
Patient did not respond to doxycycline but some improvement with 10 day course of cefuroxime. She still having persistent sinus pressure and postnasal drip. She has been symptomatic for > 4 weeks.  Obtain CT of sinuses to rule out chronic sinusitis. If positive, we discussed referral to ENT.

## 2012-05-11 ENCOUNTER — Other Ambulatory Visit: Payer: 59

## 2012-05-11 ENCOUNTER — Other Ambulatory Visit: Payer: Self-pay | Admitting: Internal Medicine

## 2012-05-13 ENCOUNTER — Other Ambulatory Visit: Payer: 59

## 2012-06-04 ENCOUNTER — Ambulatory Visit (INDEPENDENT_AMBULATORY_CARE_PROVIDER_SITE_OTHER)
Admission: RE | Admit: 2012-06-04 | Discharge: 2012-06-04 | Disposition: A | Discharge status: Home / Self Care | Payer: 59 | Source: Ambulatory Visit | Attending: Internal Medicine | Admitting: Internal Medicine

## 2012-06-04 DIAGNOSIS — J329 Chronic sinusitis, unspecified: Secondary | ICD-10-CM

## 2012-06-07 ENCOUNTER — Encounter: Payer: Self-pay | Admitting: Internal Medicine

## 2012-06-07 ENCOUNTER — Ambulatory Visit (INDEPENDENT_AMBULATORY_CARE_PROVIDER_SITE_OTHER): Payer: Managed Care, Other (non HMO) | Admitting: Internal Medicine

## 2012-06-07 VITALS — BP 102/68 | HR 117 | Temp 98.0°F | Wt 133.0 lb

## 2012-06-07 DIAGNOSIS — R059 Cough, unspecified: Secondary | ICD-10-CM

## 2012-06-07 DIAGNOSIS — R05 Cough: Secondary | ICD-10-CM

## 2012-06-07 MED ORDER — AZITHROMYCIN 250 MG PO TABS: ORAL_TABLET | ORAL | Status: DC

## 2012-06-07 NOTE — Progress Notes (Signed)
Subjective:    Patient ID: Claudia Luna, female    DOB: 1955/12/22, 57 y.o.   MRN: [id number]  HPI  57 year old white female with history of hypothyroidism and fibromyalgia for followup regarding intermittent cough. Patient previously seen for possible sinusitis. She was treated with course of doxycycline then cefuroxime. Her symptoms only improve 60%. After her previous visit her symptoms gradually got worse with acute exacerbation on may second 2014. She has nonproductive cough. She did have fever of 101.5 on Saturday and Sunday.  She has mild shortness of breath.  She underwent CT of sinuses on may second 2014. It was negative for air-fluid level. She has minimal maxillary mucoperiosteal thickening.  Review of Systems Patient denies history of pollen allergy, she denies red irritated eyes  Past Medical History  Diagnosis Date  . Thyroid disease     hypothyroid  . Anxiety   . Depression   . Asthma   . Hyperlipidemia   . Headache   . Chronic kidney disease     kidney stones, history of  . Fibromyalgia   . Gout     History   Social History  . Marital Status: Married    Spouse Name: Merlyn Albert    Number of Children: 4  . Years of Education: N/A   Occupational History  . Freight forwarder    Social History Main Topics  . Smoking status: Never Smoker   . Smokeless tobacco: Never Used  . Alcohol Use: No  . Drug Use: No  . Sexually Active: Yes   Other Topics Concern  . Not on file   Social History Narrative   Regular exercise: yes    Past Surgical History  Procedure Laterality Date  . Breast surgery  1993    reduction  . Abdominal hysterectomy    . Joint replacement      total knee  . Appendectomy    . Cholecystectomy      Family History  Problem Relation Age of Onset  . Cancer Mother     breast  . Stroke Mother     "mini"  . Coronary artery disease Father     status post CABG  . Peripheral vascular disease Father   . Hypertension  Father   . Hyperlipidemia Father     Allergies  Allergen Reactions  . Codeine     REACTION: VOMITING  . Penicillins     REACTION: RASH  . Pregabalin     REACTION: swelling and weight gain    Current Outpatient Prescriptions on File Prior to Visit  Medication Sig Dispense Refill  . amphetamine-dextroamphetamine (ADDERALL) 20 MG tablet Take 20 mg by mouth 2 (two) times daily.       . baclofen (LIORESAL) 10 MG tablet Take 10 mg by mouth daily.      . benzonatate (TESSALON) 100 MG capsule Take 1 capsule (100 mg total) by mouth 3 (three) times daily as needed for cough.  30 capsule  1  . clonazePAM (KLONOPIN) 0.5 MG tablet Take 1 tablet (0.5 mg total) by mouth at bedtime.  30 tablet  5  . estradiol (VIVELLE-DOT) 0.1 MG/24HR Place 1 patch onto the skin 2 (two) times a week.       . levothyroxine (SYNTHROID, LEVOTHROID) 100 MCG tablet Take 1 tablet (100 mcg total) by mouth daily.  30 tablet  5  . mometasone (NASONEX) 50 MCG/ACT nasal spray Place 2 sprays into the nose daily.  17 g  3  . PARoxetine (  PAXIL) 20 MG tablet Take 1 tablet (20 mg total) by mouth every morning.  30 tablet  11  . rizatriptan (MAXALT) 10 MG tablet Take 1 tablet (10 mg total) by mouth as needed for migraine. May repeat in 2 hours if needed  10 tablet  11  . rosuvastatin (CRESTOR) 20 MG tablet Take 1 tablet (20 mg total) by mouth daily.  30 tablet  11  . traMADol (ULTRAM) 50 MG tablet TAKE 1 TABLET BY MOUTH EVERY 6 HOURS AS NEEDED FOR PAIN  120 tablet  0  . valACYclovir (VALTREX) 1000 MG tablet Take 1 tablet (1,000 mg total) by mouth daily.  30 tablet  11  . Vitamin D, Ergocalciferol, (DRISDOL) 50000 UNITS CAPS Take 1 capsule (50,000 Units total) by mouth every 7 (seven) days.  30 capsule  11  . zolpidem (AMBIEN) 5 MG tablet Take 1 tablet (5 mg total) by mouth at bedtime as needed for sleep.  30 tablet  5   No current facility-administered medications on file prior to visit.    BP 102/68  Pulse 117  Temp(Src) 98 F  (36.7 C) (Oral)  Wt 133 lb (60.328 kg)  BMI 25.14 kg/m2       Objective:   Physical Exam  Constitutional: She is oriented to person, place, and time. She appears well-developed and well-nourished.  HENT:  Head: Normocephalic and atraumatic.  Right Ear: External ear normal.  Left Ear: External ear normal.  Mouth/Throat: No oropharyngeal exudate.  Mild oropharyngeal erythema  Neck: Normal range of motion. Neck supple.  Cardiovascular: Normal rate, regular rhythm and normal heart sounds.   Pulmonary/Chest: Effort normal and breath sounds normal. She has no wheezes.  Lymphadenopathy:    She has no cervical adenopathy.  Neurological: She is alert and oriented to person, place, and time. No cranial nerve deficit.  Psychiatric: She has a normal mood and affect. Her behavior is normal.          Assessment & Plan:

## 2012-06-07 NOTE — Assessment & Plan Note (Signed)
Patient's CT scan of sinuses was negative. Patient having exacerbation over last 4-5 days. Unclear whether symptoms secondary to viral etiology versus atypical tracheal bronchitis. Treat with azithromycin 500 mg once daily for 3 days. We are hoping patient has improvement with immunomodulatory effects of azithromycin. Patient advised to call office if symptoms persist or worsen.

## 2012-06-07 NOTE — Patient Instructions (Addendum)
Gargle with warm salt water Use intranasal saline sprays as directed Please contact our office if your symptoms do not improve or gets worse.

## 2012-06-12 ENCOUNTER — Other Ambulatory Visit: Payer: Self-pay | Admitting: Internal Medicine

## 2012-06-14 ENCOUNTER — Telehealth: Payer: Self-pay | Admitting: Internal Medicine

## 2012-06-14 NOTE — Telephone Encounter (Signed)
Please call pulmonary and see if they can see pt sooner.

## 2012-06-14 NOTE — Telephone Encounter (Signed)
Patient's spouse called stating that Pulmonary can not see his wife until 07/08/12 and he would like her seen sooner. Please advise.

## 2012-06-15 NOTE — Telephone Encounter (Signed)
Pt husband aware that pt was set up for an appt on 5/15 @ 1:45

## 2012-06-17 ENCOUNTER — Institutional Professional Consult (permissible substitution): Payer: 59 | Admitting: Internal Medicine

## 2012-06-22 ENCOUNTER — Telehealth: Payer: Self-pay | Admitting: Internal Medicine

## 2012-06-22 MED ORDER — CEFUROXIME AXETIL 500 MG PO TABS: 500.0000 mg | ORAL_TABLET | Freq: Two times a day (BID) | ORAL | Status: DC

## 2012-06-22 NOTE — Telephone Encounter (Signed)
Patient would like letter out of work faxed to [phone] refer to previous message.

## 2012-06-22 NOTE — Telephone Encounter (Signed)
Call in cefuroxime 500 mg one po bid  #20.  No RF

## 2012-06-22 NOTE — Telephone Encounter (Signed)
Pt aware.

## 2012-06-22 NOTE — Telephone Encounter (Signed)
Caller: Toleen/Patient; Phone: [phone]; Reason for Call: Pt calling today 06/22/12 regarding Dr.  Artist Pais has been treating her for on-going sinus issues since October 2013.  Pt calling today to request refill on Z Pack and Tessalon Pearles.  Pt declined triage or appt, said she can't miss anymore work because she will be fired.  Would like refill called in to [facility], [address], [phone].  Having yellow/green nasal discharge, dry cough.  Afebrile.  PLEASE CALL PT BACK AT [phone] TO LET HER KNOW IF MD AGREES TO CALL IN MEDICATION WITHOUT APPT.  Thanks. (Previous office visit was 06/07/2012).

## 2012-06-22 NOTE — Telephone Encounter (Signed)
Patient called stating that she need a letter out of work for May 12 and 13. Please assist.

## 2012-06-25 ENCOUNTER — Other Ambulatory Visit: Payer: Self-pay | Admitting: *Deleted

## 2012-06-25 MED ORDER — BENZONATATE 100 MG PO CAPS: 100.0000 mg | ORAL_CAPSULE | Freq: Three times a day (TID) | ORAL | Status: DC | PRN

## 2012-07-02 ENCOUNTER — Telehealth: Payer: Self-pay | Admitting: Internal Medicine

## 2012-07-02 MED ORDER — CEFUROXIME AXETIL 500 MG PO TABS: 500.0000 mg | ORAL_TABLET | Freq: Two times a day (BID) | ORAL | Status: DC

## 2012-07-02 NOTE — Telephone Encounter (Signed)
rx sent in electronically 

## 2012-07-02 NOTE — Telephone Encounter (Signed)
Ok to refill x 1  

## 2012-07-02 NOTE — Telephone Encounter (Signed)
Pt is calling to request a refill of antibiotic.  Pt reports she has been treated by Dr Artist Pais for chronic sinusitis.  Pt states that she has finally found an antibiotic that seems to be helping (Cefuroxime 500mg  BID) Pt states she is only about 60% better and needs to continue to take the medication so she doesn't get worse.  Pt is requesting a refill of the medication to be sent to [facility] at [address] [place] [phone].  OFFICE PLEASE FOLLOW UP WITH PT

## 2012-07-12 ENCOUNTER — Other Ambulatory Visit: Payer: Self-pay | Admitting: Internal Medicine

## 2012-07-13 ENCOUNTER — Institutional Professional Consult (permissible substitution): Payer: 59 | Admitting: Internal Medicine

## 2012-07-14 ENCOUNTER — Telehealth: Payer: Self-pay | Admitting: Internal Medicine

## 2012-07-14 NOTE — Telephone Encounter (Signed)
Pharm tried to escribe, but it failed. Need refill of  benzonatate (TESSALON) 100 MG capsule traMADol (ULTRAM) 50 MG tablet [facility]/ [address]

## 2012-07-14 NOTE — Telephone Encounter (Signed)
You want pt to have another refill on tessalon?

## 2012-07-15 ENCOUNTER — Other Ambulatory Visit: Payer: Self-pay | Admitting: *Deleted

## 2012-07-15 MED ORDER — BENZONATATE 100 MG PO CAPS: 100.0000 mg | ORAL_CAPSULE | Freq: Three times a day (TID) | ORAL | Status: DC | PRN

## 2012-07-15 MED ORDER — TRAMADOL HCL 50 MG PO TABS: ORAL_TABLET | ORAL | Status: DC

## 2012-07-15 NOTE — Telephone Encounter (Signed)
Ok for refill on tessalon pearles

## 2012-07-16 ENCOUNTER — Other Ambulatory Visit: Payer: Self-pay | Admitting: Internal Medicine

## 2012-07-16 NOTE — Telephone Encounter (Signed)
Has been getting tramadol 50 now asking for 300 24 hr- do youu want to change?

## 2012-07-16 NOTE — Telephone Encounter (Signed)
Pharmacy called to request refill of PT's TraMADol HCl 300 MG TB24. Please assist.

## 2012-07-27 ENCOUNTER — Telehealth: Payer: Self-pay | Admitting: Internal Medicine

## 2012-07-27 NOTE — Telephone Encounter (Signed)
Ok with me 

## 2012-07-27 NOTE — Telephone Encounter (Signed)
Pt aware/kh 

## 2012-07-27 NOTE — Telephone Encounter (Addendum)
Pt would like to switch to Dr Yetta Barre from Dr Artist Pais.  Is that OK with you Dr Artist Pais? Will you accept this pt Dr Fayrene Fearing?

## 2012-07-27 NOTE — Telephone Encounter (Signed)
She saw me before, then for a reason unknown to me she and her husband decided not to see me anymore so I don't feel comfortable seeing her again.

## 2012-08-03 ENCOUNTER — Other Ambulatory Visit: Payer: Self-pay | Admitting: Internal Medicine

## 2012-08-04 NOTE — Telephone Encounter (Signed)
Pt is going out town today at noon

## 2012-08-09 ENCOUNTER — Encounter: Payer: Self-pay | Admitting: Internal Medicine

## 2012-08-10 ENCOUNTER — Other Ambulatory Visit: Payer: Self-pay | Admitting: Internal Medicine

## 2012-08-29 ENCOUNTER — Other Ambulatory Visit: Payer: Self-pay | Admitting: Internal Medicine

## 2012-09-20 ENCOUNTER — Other Ambulatory Visit: Payer: Self-pay | Admitting: *Deleted

## 2012-09-20 DIAGNOSIS — E785 Hyperlipidemia, unspecified: Secondary | ICD-10-CM

## 2012-09-20 DIAGNOSIS — E039 Hypothyroidism, unspecified: Secondary | ICD-10-CM

## 2012-09-20 MED ORDER — LEVOTHYROXINE SODIUM 100 MCG PO TABS: 100.0000 ug | ORAL_TABLET | Freq: Every day | ORAL | Status: DC

## 2012-09-20 MED ORDER — ROSUVASTATIN CALCIUM 20 MG PO TABS: 20.0000 mg | ORAL_TABLET | Freq: Every day | ORAL | Status: DC

## 2012-09-21 ENCOUNTER — Other Ambulatory Visit: Payer: Self-pay | Admitting: *Deleted

## 2012-09-21 DIAGNOSIS — G4700 Insomnia, unspecified: Secondary | ICD-10-CM

## 2012-09-21 MED ORDER — TRAMADOL HCL 50 MG PO TABS: ORAL_TABLET | ORAL | Status: DC

## 2012-10-07 ENCOUNTER — Other Ambulatory Visit: Payer: Self-pay | Admitting: Internal Medicine

## 2012-10-07 ENCOUNTER — Telehealth: Payer: Self-pay | Admitting: *Deleted

## 2012-10-07 MED ORDER — TRAMADOL HCL 50 MG PO TABS: ORAL_TABLET | ORAL | Status: DC

## 2012-10-07 NOTE — Telephone Encounter (Signed)
Refill tramadol x 1 only.  Needs OV for additional refills

## 2012-10-07 NOTE — Telephone Encounter (Signed)
Husband is calling requesting a refill of Tramadol.  Okay to fill?

## 2012-10-08 ENCOUNTER — Telehealth: Payer: Self-pay | Admitting: Internal Medicine

## 2012-10-08 DIAGNOSIS — M797 Fibromyalgia: Secondary | ICD-10-CM

## 2012-10-08 NOTE — Telephone Encounter (Signed)
I would defer to pain mgt re: use of Tramadol 300 mg slow release.  Use tramadol 50 mg for now until seen by pain mgt

## 2012-10-08 NOTE — Telephone Encounter (Signed)
Referral order placed, pts husband aware, cancelled appt for Monday

## 2012-10-08 NOTE — Telephone Encounter (Signed)
Pt's spouse came in today to make her an appointment for Monday for a referral to [facility].  Also pt's spouse would like a call back this afternoon after you consult with Dr. Artist Pais about the Tramadol 300 Mg slow release that Dr. Yetta Barre prescribed for Mrs. Drost.  Please call Mr. Karen back on his cell number on file. Thank you. [phone]

## 2012-10-09 ENCOUNTER — Other Ambulatory Visit: Payer: Self-pay | Admitting: Internal Medicine

## 2012-10-11 ENCOUNTER — Ambulatory Visit: Payer: 59 | Admitting: Internal Medicine

## 2012-10-22 ENCOUNTER — Other Ambulatory Visit: Payer: Self-pay | Admitting: Internal Medicine

## 2012-10-27 ENCOUNTER — Other Ambulatory Visit: Payer: Self-pay | Admitting: *Deleted

## 2012-10-27 DIAGNOSIS — E039 Hypothyroidism, unspecified: Secondary | ICD-10-CM

## 2012-10-27 MED ORDER — LEVOTHYROXINE SODIUM 100 MCG PO TABS: 100.0000 ug | ORAL_TABLET | Freq: Every day | ORAL | Status: DC

## 2012-10-29 ENCOUNTER — Other Ambulatory Visit: Payer: Self-pay | Admitting: *Deleted

## 2012-10-29 DIAGNOSIS — G4700 Insomnia, unspecified: Secondary | ICD-10-CM

## 2012-10-29 MED ORDER — ZOLPIDEM TARTRATE 5 MG PO TABS: 5.0000 mg | ORAL_TABLET | Freq: Every evening | ORAL | Status: DC | PRN

## 2012-10-29 MED ORDER — CLONAZEPAM 0.5 MG PO TABS: 0.5000 mg | ORAL_TABLET | Freq: Every day | ORAL | Status: DC

## 2012-10-29 NOTE — Addendum Note (Signed)
Addended by: Alfred Levins D on: 10/29/2012 09:38 AM   Modules accepted: Orders

## 2012-11-04 ENCOUNTER — Other Ambulatory Visit: Payer: Self-pay | Admitting: Internal Medicine

## 2012-11-09 NOTE — Telephone Encounter (Signed)
Husband following up on request for traMADol (ULTRAM) 50 MG tablet [facility] requested 10/2

## 2012-11-09 NOTE — Telephone Encounter (Signed)
Pt husband is calling to inquire about this refill. He states that he is usually home to call for her refills ahead of time, but he is over seas right now. He states that she is completely out, and has been all weekend. She did not call until Thursday when she ran out. Please assist.

## 2012-11-10 ENCOUNTER — Telehealth: Payer: Self-pay | Admitting: *Deleted

## 2012-11-10 NOTE — Telephone Encounter (Signed)
Pat, pharmacist at [facility] called and stated that pt claimed she was taking tramadol 50 mg 2 tabs six times a day.  He stated that pt was upset about the q6h directions and made a scene so he refused to fill the one that I called in.

## 2012-11-11 MED ORDER — TRAMADOL HCL 50 MG PO TABS: 100.0000 mg | ORAL_TABLET | Freq: Four times a day (QID) | ORAL | Status: DC | PRN

## 2012-11-11 NOTE — Telephone Encounter (Signed)
Medications records reviewed - she was on 2 tabs q 6 hrs in the past.  Only refill 120.  She needs to establish / follow up with pain mgt re: future tramadol prescriptions.  If she does not have pain mgt specialist, I suggest referral to Dr. Vear Clock.

## 2012-11-11 NOTE — Telephone Encounter (Signed)
rx called in, pharmacy aware no more refills will be given by Dr Artist Pais, pt aware also

## 2012-11-16 ENCOUNTER — Ambulatory Visit (INDEPENDENT_AMBULATORY_CARE_PROVIDER_SITE_OTHER): Payer: Managed Care, Other (non HMO) | Admitting: Internal Medicine

## 2012-11-16 ENCOUNTER — Encounter: Payer: Self-pay | Admitting: Internal Medicine

## 2012-11-16 VITALS — BP 114/76 | HR 117 | Temp 98.4°F | Ht 61.0 in | Wt 134.0 lb

## 2012-11-16 DIAGNOSIS — R079 Chest pain, unspecified: Secondary | ICD-10-CM

## 2012-11-16 DIAGNOSIS — R0602 Shortness of breath: Secondary | ICD-10-CM

## 2012-11-16 DIAGNOSIS — E039 Hypothyroidism, unspecified: Secondary | ICD-10-CM

## 2012-11-16 DIAGNOSIS — E785 Hyperlipidemia, unspecified: Secondary | ICD-10-CM

## 2012-11-16 DIAGNOSIS — R0789 Other chest pain: Secondary | ICD-10-CM | POA: Insufficient documentation

## 2012-11-16 DIAGNOSIS — R5381 Other malaise: Secondary | ICD-10-CM

## 2012-11-16 DIAGNOSIS — H04123 Dry eye syndrome of bilateral lacrimal glands: Secondary | ICD-10-CM

## 2012-11-16 DIAGNOSIS — G4700 Insomnia, unspecified: Secondary | ICD-10-CM

## 2012-11-16 DIAGNOSIS — H04129 Dry eye syndrome of unspecified lacrimal gland: Secondary | ICD-10-CM

## 2012-11-16 DIAGNOSIS — R531 Weakness: Secondary | ICD-10-CM

## 2012-11-16 DIAGNOSIS — R9431 Abnormal electrocardiogram [ECG] [EKG]: Secondary | ICD-10-CM

## 2012-11-16 DIAGNOSIS — F329 Major depressive disorder, single episode, unspecified: Secondary | ICD-10-CM

## 2012-11-16 MED ORDER — ZOLPIDEM TARTRATE 5 MG PO TABS: 5.0000 mg | ORAL_TABLET | Freq: Every evening | ORAL | Status: DC | PRN

## 2012-11-16 MED ORDER — PAROXETINE HCL 20 MG PO TABS: 20.0000 mg | ORAL_TABLET | ORAL | Status: DC

## 2012-11-16 NOTE — Assessment & Plan Note (Signed)
Hold Crestor considering exacerbation of her fibromyalgia symptoms. Check CPK.

## 2012-11-16 NOTE — Assessment & Plan Note (Signed)
Monitor TFTs

## 2012-11-16 NOTE — Assessment & Plan Note (Signed)
Check for Sjogren syndrome. Check SSA, SSB antibodies. Check ANA and rheumatoid factor

## 2012-11-16 NOTE — Assessment & Plan Note (Signed)
Continue same dose of Paxil

## 2012-11-16 NOTE — Progress Notes (Signed)
Subjective:    Patient ID: Claudia Luna, female    DOB: January 22, 1956, 57 y.o.   MRN: [id number]  HPI  57 year old white female with history of chronic fatigue and fibromyalgia, and hypothyroidism complains of chronic chest pain and shortness of breath. She reports her symptoms have been ongoing since May of 2014. Her fatigue has gotten much worse. She sleeps 12-16 hours per day. She has been almost bedbound for the last 3-4 months. She has been unable to work.  She has nonproductive cough and shortness of breath. She can only climb 4-5 wet stairs without stopping to catch her breath. She complains of upper chest pain when she coughs and takes a deep breath.  She has bilateral intermittent swelling in her legs and feet.  Review of Systems Dry eyes, low grade fevers, achy painful joints    Past Medical History  Diagnosis Date  . Thyroid disease     hypothyroid  . Anxiety   . Depression   . Asthma   . Hyperlipidemia   . Headache(784.0)   . Chronic kidney disease     kidney stones, history of  . Fibromyalgia   . Gout     History   Social History  . Marital Status: Married    Spouse Name: Merlyn Albert    Number of Children: 4  . Years of Education: N/A   Occupational History  . Freight forwarder    Social History Main Topics  . Smoking status: Never Smoker   . Smokeless tobacco: Never Used  . Alcohol Use: No  . Drug Use: No  . Sexual Activity: Yes   Other Topics Concern  . Not on file   Social History Narrative   Regular exercise: yes    Past Surgical History  Procedure Laterality Date  . Breast surgery  1993    reduction  . Abdominal hysterectomy    . Joint replacement      total knee  . Appendectomy    . Cholecystectomy      Family History  Problem Relation Age of Onset  . Cancer Mother     breast  . Stroke Mother     "mini"  . Coronary artery disease Father     status post CABG  . Peripheral vascular disease Father   . Hypertension  Father   . Hyperlipidemia Father     Allergies  Allergen Reactions  . Codeine     REACTION: VOMITING  . Penicillins     REACTION: RASH  . Pregabalin     REACTION: swelling and weight gain    Current Outpatient Prescriptions on File Prior to Visit  Medication Sig Dispense Refill  . estradiol (VIVELLE-DOT) 0.1 MG/24HR Place 1 patch onto the skin 2 (two) times a week.       . levothyroxine (SYNTHROID, LEVOTHROID) 100 MCG tablet Take 1 tablet (100 mcg total) by mouth daily.  30 tablet  5  . PARoxetine (PAXIL) 20 MG tablet Take 1 tablet (20 mg total) by mouth every morning.  30 tablet  11  . rizatriptan (MAXALT) 10 MG tablet Take 1 tablet (10 mg total) by mouth as needed for migraine. May repeat in 2 hours if needed  10 tablet  11  . rosuvastatin (CRESTOR) 20 MG tablet Take 1 tablet (20 mg total) by mouth daily.  30 tablet  11  . traMADol (ULTRAM) 50 MG tablet Take 2 tablets (100 mg total) by mouth every 6 (six) hours as needed for pain.  120 tablet  0  . Vitamin D, Ergocalciferol, (DRISDOL) 50000 UNITS CAPS capsule TAKE ONE CAPSULE BY MOUTH EVERY 7 DAYS  30 capsule  0  . zolpidem (AMBIEN) 5 MG tablet Take 1 tablet (5 mg total) by mouth at bedtime as needed for sleep.  30 tablet  3   No current facility-administered medications on file prior to visit.    BP 114/76  Pulse 117  Temp(Src) 98.4 F (36.9 C) (Oral)  Ht 5\' 1"  (1.549 m)  Wt 134 lb (60.782 kg)  BMI 25.33 kg/m2  SpO2 93%  EKG shows sinus tachycardia at 102 beats per minute. Short PR syndrome. Low voltage precordial leads.  Objective:   Physical Exam  Constitutional: She is oriented to person, place, and time. She appears well-developed and well-nourished. No distress.  HENT:  Head: Normocephalic and atraumatic.  Right Ear: External ear normal.  Left Ear: External ear normal.  Mouth/Throat: Oropharynx is clear and moist.  Eyes: EOM are normal. Pupils are equal, round, and reactive to light.  Neck: Neck supple.   Cardiovascular: Normal rate, regular rhythm and normal heart sounds.   No murmur heard. Pulmonary/Chest: Effort normal and breath sounds normal.  Abdominal: Soft. Bowel sounds are normal. She exhibits no mass. There is no tenderness.  Mild distention  Musculoskeletal:  Trace lower extremity edema bilaterally. No calf tenderness  Lymphadenopathy:    She has no cervical adenopathy.  Neurological: She is alert and oriented to person, place, and time. No cranial nerve deficit. She exhibits normal muscle tone.  Skin: Skin is warm and dry. No rash noted. No erythema.  Psychiatric: She has a normal mood and affect. Her behavior is normal.          Assessment & Plan:

## 2012-11-16 NOTE — Assessment & Plan Note (Signed)
Continue same dose of Ambien

## 2012-11-16 NOTE — Assessment & Plan Note (Signed)
57 year old white female has chronic chest pain and dyspnea with exertion. She is slightly hypoxic and tachycardic on exam. Obtain CT of chest PE protocol. Check CBC differential.    Pericardial effusion/pericarditis is a consideration. Obtain 2-D echo.

## 2012-11-17 ENCOUNTER — Ambulatory Visit (INDEPENDENT_AMBULATORY_CARE_PROVIDER_SITE_OTHER)
Admission: RE | Admit: 2012-11-17 | Discharge: 2012-11-17 | Disposition: A | Discharge status: Home / Self Care | Payer: Managed Care, Other (non HMO) | Source: Ambulatory Visit | Attending: Internal Medicine | Admitting: Internal Medicine

## 2012-11-17 DIAGNOSIS — R079 Chest pain, unspecified: Secondary | ICD-10-CM

## 2012-11-17 DIAGNOSIS — R0602 Shortness of breath: Secondary | ICD-10-CM

## 2012-11-17 LAB — BASIC METABOLIC PANEL
BUN: 17 mg/dL (ref 6–23)
CO2: 27 mEq/L (ref 19–32)
Calcium: 10 mg/dL (ref 8.4–10.5)
Chloride: 105 mEq/L (ref 96–112)
Creatinine, Ser: 0.9 mg/dL (ref 0.4–1.2)
GFR: 73.59 mL/min (ref >60.00)
Glucose, Bld: 100 mg/dL — ABNORMAL HIGH (ref 70–99)
Potassium: 3.2 mEq/L — ABNORMAL LOW (ref 3.5–5.1)
Sodium: 142 mEq/L (ref 135–145)

## 2012-11-17 LAB — CBC WITH DIFFERENTIAL/PLATELET
Basophils Absolute: 0 10*3/uL (ref 0.0–0.1)
Basophils Relative: 0.3 % (ref 0.0–3.0)
Eosinophils Absolute: 0.2 10*3/uL (ref 0.0–0.7)
Eosinophils Relative: 2.2 % (ref 0.0–5.0)
HCT: 44.5 % (ref 36.0–46.0)
Hemoglobin: 15.2 g/dL — ABNORMAL HIGH (ref 12.0–15.0)
Lymphocytes Relative: 26.7 % (ref 12.0–46.0)
Lymphs Abs: 2.4 10*3/uL (ref 0.7–4.0)
MCHC: 34.2 g/dL (ref 30.0–36.0)
MCV: 89.6 fl (ref 78.0–100.0)
Monocytes Absolute: 0.5 10*3/uL (ref 0.1–1.0)
Monocytes Relative: 5.9 % (ref 3.0–12.0)
Neutro Abs: 5.9 10*3/uL (ref 1.4–7.7)
Neutrophils Relative %: 64.9 % (ref 43.0–77.0)
Platelets: 305 10*3/uL (ref 150.0–400.0)
RBC: 4.97 Mil/uL (ref 3.87–5.11)
RDW: 13.4 % (ref 11.5–14.6)
WBC: 9.1 10*3/uL (ref 4.5–10.5)

## 2012-11-17 LAB — HEPATIC FUNCTION PANEL
ALT: 14 U/L (ref 0–35)
AST: 19 U/L (ref 0–37)
Albumin: 4.4 g/dL (ref 3.5–5.2)
Alkaline Phosphatase: 54 U/L (ref 39–117)
Bilirubin, Direct: 0.1 mg/dL (ref 0.0–0.3)
Total Bilirubin: 0.4 mg/dL (ref 0.3–1.2)
Total Protein: 7.5 g/dL (ref 6.0–8.3)

## 2012-11-17 LAB — TSH: TSH: 0.11 u[IU]/mL — ABNORMAL LOW (ref 0.35–5.50)

## 2012-11-17 LAB — T4, FREE: Free T4: 1.1 ng/dL (ref 0.60–1.60)

## 2012-11-17 LAB — SJOGREN'S SYNDROME ANTIBODS(SSA + SSB)
SSA (Ro) (ENA) Antibody, IgG: 8 AU/mL (ref <30)
SSB (La) (ENA) Antibody, IgG: <1 AU/mL (ref <30)

## 2012-11-17 LAB — RHEUMATOID FACTOR: Rhuematoid fact SerPl-aCnc: <10 IU/mL (ref <=14)

## 2012-11-17 LAB — SEDIMENTATION RATE: Sed Rate: 9 mm/hr (ref 0–22)

## 2012-11-17 LAB — ANA: Anti Nuclear Antibody(ANA): NEGATIVE

## 2012-11-17 LAB — CK: Total CK: 39 U/L (ref 7–177)

## 2012-11-17 MED ORDER — IOHEXOL 350 MG/ML SOLN
80.0000 mL | Freq: Once | INTRAVENOUS | Status: AC | PRN
  Administered 2012-11-17: 80 mL via INTRAVENOUS

## 2012-11-19 ENCOUNTER — Other Ambulatory Visit: Payer: Self-pay | Admitting: Internal Medicine

## 2012-11-19 DIAGNOSIS — R0602 Shortness of breath: Secondary | ICD-10-CM

## 2012-11-19 DIAGNOSIS — R053 Chronic cough: Secondary | ICD-10-CM

## 2012-11-19 DIAGNOSIS — R918 Other nonspecific abnormal finding of lung field: Secondary | ICD-10-CM

## 2012-11-19 DIAGNOSIS — R05 Cough: Secondary | ICD-10-CM

## 2012-11-24 ENCOUNTER — Institutional Professional Consult (permissible substitution): Payer: Managed Care, Other (non HMO) | Admitting: Internal Medicine

## 2012-11-26 ENCOUNTER — Institutional Professional Consult (permissible substitution): Payer: Managed Care, Other (non HMO) | Admitting: Internal Medicine

## 2012-12-01 ENCOUNTER — Ambulatory Visit (HOSPITAL_COMMUNITY): Payer: Managed Care, Other (non HMO) | Attending: Internal Medicine | Admitting: Radiology

## 2012-12-01 ENCOUNTER — Other Ambulatory Visit: Payer: Self-pay

## 2012-12-01 DIAGNOSIS — R0989 Other specified symptoms and signs involving the circulatory and respiratory systems: Secondary | ICD-10-CM | POA: Insufficient documentation

## 2012-12-01 DIAGNOSIS — E785 Hyperlipidemia, unspecified: Secondary | ICD-10-CM | POA: Insufficient documentation

## 2012-12-01 DIAGNOSIS — Z8249 Family history of ischemic heart disease and other diseases of the circulatory system: Secondary | ICD-10-CM | POA: Insufficient documentation

## 2012-12-01 DIAGNOSIS — I498 Other specified cardiac arrhythmias: Secondary | ICD-10-CM | POA: Insufficient documentation

## 2012-12-01 DIAGNOSIS — R079 Chest pain, unspecified: Secondary | ICD-10-CM | POA: Insufficient documentation

## 2012-12-01 DIAGNOSIS — R0609 Other forms of dyspnea: Secondary | ICD-10-CM | POA: Insufficient documentation

## 2012-12-01 DIAGNOSIS — R059 Cough, unspecified: Secondary | ICD-10-CM | POA: Insufficient documentation

## 2012-12-01 DIAGNOSIS — R0602 Shortness of breath: Secondary | ICD-10-CM

## 2012-12-01 DIAGNOSIS — R072 Precordial pain: Secondary | ICD-10-CM

## 2012-12-01 DIAGNOSIS — R05 Cough: Secondary | ICD-10-CM | POA: Insufficient documentation

## 2012-12-01 NOTE — Progress Notes (Signed)
Echocardiogram performed.  

## 2012-12-02 ENCOUNTER — Ambulatory Visit (INDEPENDENT_AMBULATORY_CARE_PROVIDER_SITE_OTHER): Payer: Managed Care, Other (non HMO) | Admitting: Internal Medicine

## 2012-12-02 ENCOUNTER — Encounter: Payer: Self-pay | Admitting: Internal Medicine

## 2012-12-02 ENCOUNTER — Telehealth: Payer: Self-pay | Admitting: Internal Medicine

## 2012-12-02 VITALS — BP 86/60 | HR 100 | Temp 98.2°F | Ht 61.0 in | Wt 136.0 lb

## 2012-12-02 DIAGNOSIS — R05 Cough: Secondary | ICD-10-CM

## 2012-12-02 DIAGNOSIS — Z23 Encounter for immunization: Secondary | ICD-10-CM

## 2012-12-02 DIAGNOSIS — R9389 Abnormal findings on diagnostic imaging of other specified body structures: Secondary | ICD-10-CM

## 2012-12-02 DIAGNOSIS — R059 Cough, unspecified: Secondary | ICD-10-CM

## 2012-12-02 MED ORDER — OXYCODONE HCL 5 MG PO CAPS: 5.0000 mg | ORAL_CAPSULE | ORAL | Status: DC | PRN

## 2012-12-02 MED ORDER — PANTOPRAZOLE SODIUM 40 MG PO TBEC: 40.0000 mg | DELAYED_RELEASE_TABLET | Freq: Every day | ORAL | Status: DC

## 2012-12-02 MED ORDER — PREDNISONE (PAK) 10 MG PO TABS: ORAL_TABLET | ORAL | Status: DC

## 2012-12-02 MED ORDER — FAMOTIDINE 20 MG PO TABS: ORAL_TABLET | ORAL | Status: DC

## 2012-12-02 NOTE — Patient Instructions (Addendum)
The key to effective treatment for your cough is eliminating the non-stop cycle of cough you're stuck in long enough to let your airway heal completely and then see if there is anything still making you cough once you stop the cough suppression, but this should take no more than 5 days to figure out  First take delsym two tsp every 12 hours and supplement if needed with oxy ir 5 mg  up to 2 every 4 hours to suppress the urge to cough at all or even clear your throat. Swallowing water or using ice chips/non mint and menthol containing candies (such as lifesavers or sugarless jolly ranchers) are also effective.  You should rest your voice and avoid activities that you know make you cough.  Once you have eliminated the cough for 3 straight days try reducing the oxy ir first,  then the delsym as tolerated.    Pantoprazole (protonix) 40 mg   Take 30-60 min before first meal of the day and Pepcid 20 mg one bedtime until return to office - this is the best way to tell whether stomach acid is contributing to your problem.    GERD (REFLUX)  is an extremely common cause of respiratory symptoms, many times with no significant heartburn at all.    It can be treated with medication, but also with lifestyle changes including avoidance of late meals, excessive alcohol, smoking cessation, and avoid fatty foods, chocolate, peppermint, colas, red wine, and acidic juices such as orange juice.  NO MINT OR MENTHOL PRODUCTS SO NO COUGH DROPS  USE SUGARLESS CANDY INSTEAD (jolley ranchers or Stover's)  NO OIL BASED VITAMINS - use powdered substitutes.    Prednisone 10 mg take  4 each am x 2 days,   2 each am x 2 days,  1 each am x 2 days and stop    Please schedule a follow up office visit in 4 weeks, sooner if needed

## 2012-12-02 NOTE — Telephone Encounter (Signed)
Pt wants to Dr Yetta Barre for PCP again, states she leaves right around the corner from the [place] and wishes to see Dr Yetta Barre.

## 2012-12-02 NOTE — Progress Notes (Signed)
Subjective:     Patient ID: Claudia Luna, female   DOB: 1955/07/08   MRN: [id number]  HPI  52 yowf never smoker perfectly healthy except for  dx of fibromyalgia in 1991 referred by Dr Artist Pais to pulmonary clinic 12/02/12 for refractory cough since 10/2011    12/02/2012 1st [facility] office visit/ Matthan Sledge cc acute onset September 2013  facial pain, sore throat assoc with cough yellow mucus sorta like a head cold that persisted and some better with abx and cough turned more dry but persisted ever since. Only sob when coughing. No cough to vomit. Cough to point where sore in upper abd during cough and also midline upper chest  Cough is daily wakes her up and decreases some p asleep.   No obvious day to day or daytime variabilty or assoc classically pleuritic cp or chest tightness, subjective wheeze overt sinus or hb symptoms. No unusual exp hx or h/o childhood pna/ asthma or knowledge of premature birth.  Sleeping ok without nocturnal  or early am exacerbation  of respiratory  c/o's or need for noct saba. Also denies any obvious fluctuation of symptoms with weather or environmental changes or other aggravating or alleviating factors except as outlined above   Current Medications, Allergies, Complete Past Medical History, Past Surgical History, Family History, and Social History were reviewed in [employer] record.  ROS  The following are not active complaints unless bolded sore throat, dysphagia, dental problems, itching, sneezing,  nasal congestion or excess/ purulent secretions, ear ache,   fever, chills, sweats, unintended wt loss, pleuritic or exertional cp, hemoptysis,  orthopnea pnd or leg swelling, presyncope, palpitations, heartburn, abdominal pain, anorexia, nausea, vomiting, diarrhea  or change in bowel or urinary habits, change in stools or urine, dysuria,hematuria,  rash, arthralgias, visual complaints, headache, numbness weakness or ataxia or problems with  walking or coordination,  change in mood/affect or memory.         Review of Systems     Objective:   Physical Exam  Very hoarse amb wf nad  Wt Readings from Last 3 Encounters:  12/02/12 136 lb (61.689 kg)  11/16/12 134 lb (60.782 kg)  06/07/12 133 lb (60.328 kg)       HEENT: nl dentition, turbinates, and orophanx. Nl external ear canals without cough reflex   NECK :  without JVD/Nodes/TM/ nl carotid upstrokes bilaterally   LUNGS: no acc muscle use, clear to A and P bilaterally with  Cough early  on insp or exp maneuvers   CV:  RRR  no s3 or murmur or increase in P2, no edema   ABD:  soft and nontender with nl excursion in the supine position. No bruits or organomegaly, bowel sounds nl  MS:  warm without deformities, calf tenderness, cyanosis or clubbing  SKIN: warm and dry without lesions    NEURO:  alert, approp, no deficits    Sinus CT 06/04/12 Minimal maxillary mucoperiosteal thickening.  Ct chst 11/17/12  1. No evidence of acute pulmonary embolism.  2. Nonspecific patchy ground-glass opacities in both lungs with  early mosaic attenuation pattern   Assessment:

## 2012-12-03 NOTE — Telephone Encounter (Signed)
Dr. Yetta Barre has refused her in the past.  But it would be OK with me as long as Dr. Yetta Barre accepts her as new pt.

## 2012-12-03 NOTE — Assessment & Plan Note (Signed)
The most common causes of chronic cough in immunocompetent adults include the following: upper airway cough syndrome (UACS), previously referred to as postnasal drip syndrome (PNDS), which is caused by variety of rhinosinus conditions; (2) asthma; (3) GERD; (4) chronic bronchitis from cigarette smoking or other inhaled environmental irritants; (5) nonasthmatic eosinophilic bronchitis; and (6) bronchiectasis.   These conditions, singly or in combination, have accounted for up to 94% of the causes of chronic cough in prospective studies.   Other conditions have constituted no >6% of the causes in prospective studies These have included bronchogenic carcinoma, chronic interstitial pneumonia, sarcoidosis, left ventricular failure, ACEI-induced cough, and aspiration from a condition associated with pharyngeal dysfunction.    Chronic cough is often simultaneously caused by more than one condition. A single cause has been found from 38 to 82% of the time, multiple causes from 18 to 62%. Multiply caused cough has been the result of three diseases up to 42% of the time.       most likely this is Classic Upper airway cough syndrome, so named because it's frequently impossible to sort out how much is  CR/sinusitis with freq throat clearing (which can be related to primary GERD)   vs  causing  secondary (" extra esophageal")  GERD from wide swings in gastric pressure that occur with throat clearing, often  promoting self use of mint and menthol lozenges that reduce the lower esophageal sphincter tone and exacerbate the problem further in a cyclical fashion.   These are the same pts (now being labeled as having "irritable larynx syndrome" by some cough centers) who not infrequently have a history of having failed to tolerate ace inhibitors,  dry powder inhalers or biphosphonates or report having atypical reflux symptoms that don't respond to standard doses of PPI , and are easily confused as having aecopd or asthma  flares by even experienced allergists/ pulmonologists.   See instructions for specific recommendations which were reviewed directly with the patient who was given a copy with highlighter outlining the key components.

## 2012-12-03 NOTE — Assessment & Plan Note (Signed)
Although there are clearly abnormalities on CT scan, they should probably be considered "microscopic" since not obvious on plain cxr .     In the setting of obvious "macroscopic" health issues,  I am very reluctatnt to embark on an invasive w/u at this point but will arrange consevative  follow up and in the meantime see what we can do to address the patient's subjective concerns.    See cough w/u which I strongly doubt has anything to do with the CT which is totally non-specific.

## 2012-12-03 NOTE — Progress Notes (Signed)
Quick Note:  Left a message for pt to return. ______

## 2012-12-06 NOTE — Telephone Encounter (Signed)
No, I am not available for that 

## 2012-12-06 NOTE — Telephone Encounter (Signed)
Sorry, not me

## 2012-12-06 NOTE — Telephone Encounter (Signed)
Spoke with patient and she really wants to see Dr Yetta Barre, but since he won't. She is requesting to see Dr. Felicity Coyer

## 2012-12-07 NOTE — Telephone Encounter (Signed)
Pt request to speak with office manager in regards to primary care dr here at [facility]

## 2012-12-09 ENCOUNTER — Telehealth: Payer: Self-pay | Admitting: Internal Medicine

## 2012-12-09 MED ORDER — PREDNISONE (PAK) 10 MG PO TABS: ORAL_TABLET | ORAL | Status: DC

## 2012-12-09 NOTE — Telephone Encounter (Signed)
Ok x one refill but be sure she has f/u ov w/in two weeks with all meds in hand

## 2012-12-09 NOTE — Telephone Encounter (Signed)
Pt seen by MW on 12/02/12. Pt c/o dry cough and chest hurts from coughing. She finished pred taper wed. She reports her cough was almost completely gone on the last day of the prednisone but then yesterday it came right back. Pt is requesting a refill on prednisone taper. Please advise MW thanks  Allergies  Allergen Reactions  . Codeine     REACTION: VOMITING  . Penicillins     REACTION: RASH  . Pregabalin     REACTION: swelling and weight gain

## 2012-12-09 NOTE — Telephone Encounter (Signed)
Pt notified that rx for Pred taper was sent per Dr Sherene Sires.  Instructed to keep f/u appt with Dr wert with all meds in hand at [facility].

## 2012-12-10 ENCOUNTER — Telehealth: Payer: Self-pay | Admitting: Internal Medicine

## 2012-12-10 DIAGNOSIS — I313 Pericardial effusion (noninflammatory): Secondary | ICD-10-CM

## 2012-12-10 NOTE — Telephone Encounter (Signed)
Pt returning your call about echo cardi results

## 2012-12-10 NOTE — Telephone Encounter (Signed)
See result note.  

## 2012-12-16 ENCOUNTER — Telehealth: Payer: Self-pay | Admitting: Internal Medicine

## 2012-12-16 NOTE — Telephone Encounter (Signed)
Spoke with the pt  She states that she is needing another rx for oxy ir and pred  She states that her cough is back, not as severe, but she starts coughing every time she walks  OV with MW at 2 pm tomorrow

## 2012-12-17 ENCOUNTER — Ambulatory Visit (INDEPENDENT_AMBULATORY_CARE_PROVIDER_SITE_OTHER): Payer: Managed Care, Other (non HMO) | Admitting: Internal Medicine

## 2012-12-17 ENCOUNTER — Encounter: Payer: Self-pay | Admitting: Internal Medicine

## 2012-12-17 VITALS — BP 104/62 | HR 121 | Temp 97.8°F | Ht 61.0 in | Wt 137.4 lb

## 2012-12-17 DIAGNOSIS — R05 Cough: Secondary | ICD-10-CM

## 2012-12-17 DIAGNOSIS — R059 Cough, unspecified: Secondary | ICD-10-CM

## 2012-12-17 MED ORDER — PREDNISONE (PAK) 10 MG PO TABS: ORAL_TABLET | ORAL | Status: DC

## 2012-12-17 MED ORDER — OXYCODONE HCL 5 MG PO CAPS: 5.0000 mg | ORAL_CAPSULE | ORAL | Status: DC | PRN

## 2012-12-17 MED ORDER — GABAPENTIN 100 MG PO CAPS: 100.0000 mg | ORAL_CAPSULE | Freq: Three times a day (TID) | ORAL | Status: DC

## 2012-12-17 NOTE — Patient Instructions (Addendum)
The key to effective treatment for your cough is eliminating the non-stop cycle of cough you're stuck in long enough to let your airway heal completely and then see if there is anything still making you cough once you stop the cough suppression, but this should take no more than 5 days to figure out  First take delsym two tsp every 12 hours and supplement if needed with oxy ir 5 mg  up to 2 every 4 hours to suppress the urge to cough at all or even clear your throat. Swallowing water or using ice chips/non mint and menthol containing candies (such as lifesavers or sugarless jolly ranchers) are also effective.  You should rest your voice and avoid activities that you know make you cough.  Once you have eliminated the cough for 3 straight days try reducing the oxy ir first,  then the delsym as tolerated.     Prednisone Take 4 for two days three for two days two for two days one for two days   Pantoprazole (protonix) 40 mg   Take 30-60 min before first meal of the day and Pepcid 20 mg one bedtime until return to office - this is the best way to tell whether stomach acid is contributing to your problem.    Neurontin 100 mg three times daily   GERD (REFLUX)  is an extremely common cause of respiratory symptoms, many times with no significant heartburn at all.    It can be treated with medication, but also with lifestyle changes including avoidance of late meals, excessive alcohol, smoking cessation, and avoid fatty foods, chocolate, peppermint, colas, red wine, and acidic juices such as orange juice.  NO MINT OR MENTHOL PRODUCTS SO NO COUGH DROPS  USE SUGARLESS CANDY INSTEAD (jolley ranchers or Quarry manager)  NO OIL BASED VITAMINS - use powdered substitutes.    See Tammy NP w/in 2 weeks with all your medications, even over the counter meds, separated in two separate bags, the ones you take no matter what vs the ones you stop once you feel better and take only as needed when you feel you need  them.   Tammy  will generate for you a new user friendly medication calendar that will put [place] all on the same page re: your medication use.     Without this process, it simply isn't possible to assure that we are providing  your outpatient care  with  the attention to detail we feel you deserve.   If we cannot assure that you're getting that kind of care,  then we cannot manage your problem effectively from this clinic.  Once you have seen Tammy and we are sure that we're all on the same page with your medication use she will arrange follow up with me.  Add: needs cxr and sinus ct if not better  And then methacholine before returns to me

## 2012-12-17 NOTE — Progress Notes (Signed)
Subjective:     Patient ID: Claudia Luna, female   DOB: 1955/05/20   MRN: [id number]   Brief patient profile:  46 yowf never smoker perfectly healthy except for  dx of fibromyalgia in 1991 referred by Dr Artist Pais to pulmonary clinic 12/02/12 for refractory cough since 10/2011   History of Present Illness  12/02/2012 1st [facility] office visit/ Josue Falconi cc acute onset September 2013  facial pain, sore throat assoc with cough yellow mucus sorta like a head cold that persisted and some better with abx and cough turned more dry but persisted ever since. Only sob when coughing. No cough to vomit. Cough to point where sore in upper abd during cough and also midline upper chest Cough is daily wakes her up and decreases some p asleep.  rec  First take delsym two tsp every 12 hours and supplement if needed with oxy ir 5 mg  up to 2 every 4 hours to suppress the urge to cough at all or even clear your throat.  .   Pantoprazole (protonix) 40 mg   Take 30-60 min before first meal of the day and Pepcid 20 mg one bedtime until return to office    GERD  Diet   Prednisone 10 mg take  4 each am x 2 days,   2 each am x 2 days,  1 each am x 2 days and stop     12/17/2012 f/u ov/Seleena Reimers re: cough x one year Chief Complaint  Patient presents with  . Acute Visit    Pt states that cough had improved while on prednisone, but has worsened since she stopped med 2 days ago.  Cough is non prod, but she feels congested in her chest.     sensation of globus, "not able to cough the fur ball up"   No obvious day to day or daytime variabilty or assoc sob unless cougingclassically pleuritic cp or chest tightness, subjective wheeze overt sinus or hb symptoms. No unusual exp hx or h/o childhood pna/ asthma or knowledge of premature birth.  Sleeping ok without nocturnal  or early am exacerbation  of respiratory  c/o's or need for noct saba. Also denies any obvious fluctuation of symptoms with weather or environmental  changes or other aggravating or alleviating factors except as outlined above   Current Medications, Allergies, Complete Past Medical History, Past Surgical History, Family History, and Social History were reviewed in [employer] record.  ROS  The following are not active complaints unless bolded sore throat, dysphagia, dental problems, itching, sneezing,  nasal congestion or excess/ purulent secretions, ear ache,   fever, chills, sweats, unintended wt loss, pleuritic or exertional cp, hemoptysis,  orthopnea pnd or leg swelling, presyncope, palpitations, heartburn, abdominal pain, anorexia, nausea, vomiting, diarrhea  or change in bowel or urinary habits, change in stools or urine, dysuria,hematuria,  rash, arthralgias, visual complaints, headache, numbness weakness or ataxia or problems with walking or coordination,  change in mood/affect or memory.               Objective:   Physical Exam  Very hoarse amb wf nad dry raspy upper airway cough   Wt Readings from Last 3 Encounters:  12/02/12 136 lb (61.689 kg)  11/16/12 134 lb (60.782 kg)  06/07/12 133 lb (60.328 kg)       HEENT: nl dentition, turbinates, and orophanx. Nl external ear canals without cough reflex   NECK :  without JVD/Nodes/TM/ nl carotid upstrokes bilaterally  LUNGS: no acc muscle use, clear to A and P bilaterally with  Cough early  on insp or exp maneuvers   CV:  RRR  no s3 or murmur or increase in P2, no edema   ABD:  soft and nontender with nl excursion in the supine position. No bruits or organomegaly, bowel sounds nl  MS:  warm without deformities, calf tenderness, cyanosis or clubbing  SKIN: warm and dry without lesions    NEURO:  alert, approp, no deficits    Sinus CT 06/04/12 Minimal maxillary mucoperiosteal thickening.  Ct chest 11/17/12  1. No evidence of acute pulmonary embolism.  2. Nonspecific patchy ground-glass opacities in both lungs with  early mosaic attenuation  pattern   Assessment:

## 2012-12-17 NOTE — Telephone Encounter (Signed)
Close encounter 

## 2012-12-18 NOTE — Assessment & Plan Note (Addendum)
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   These are the same pts (now being labeled as having "irritable larynx syndrome" by some cough centers) who not infrequently have a history of having failed to tolerate ace inhibitors,  dry powder inhalers or biphosphonates or report having atypical reflux symptoms that don't respond to standard doses of PPI , and are easily confused as having aecopd or asthma flares by even experienced allergists/ pulmonologists.  She did not implement rx exactly as rec so next step is repeat step 1 plus add neurontin now for globus and then do a trust but verify visit before starting step 2  Discussed with pt: The standardized cough guidelines published in Chest by [place] in 2006 are still the best available and consist of a multiple step process (up to 12!) , not a single office visit,  and are intended  to address this problem logically,  with an alogrithm dependent on response to empiric treatment at  each progressive step  to determine a specific diagnosis with  minimal addtional testing needed. Therefore if adherence is an issue or can't be accurately verified,  it's very unlikely the standard evaluation and treatment will be successful here.    Furthermore, response to therapy (other than acute cough suppression, which should only be used short term with avoidance of narcotic containing cough syrups if possible), can be a gradual process for which the patient may perceive immediate benefit.  Unlike going to an eye doctor  where the best perscription is almost always the first one and is immediately effective, this is almost never the case in the management of chronic cough syndromes. Therefore the patient needs to commit up front to consistently adhere to recommendations  for up to 6 weeks of therapy directed at the likely underlying problem(s) before the response can be reasonably evaluated.

## 2012-12-23 ENCOUNTER — Ambulatory Visit (INDEPENDENT_AMBULATORY_CARE_PROVIDER_SITE_OTHER): Payer: Self-pay | Admitting: Internal Medicine

## 2012-12-23 ENCOUNTER — Encounter: Payer: Self-pay | Admitting: Internal Medicine

## 2012-12-23 VITALS — BP 108/64 | HR 100 | Ht 61.0 in | Wt 140.0 lb

## 2012-12-23 DIAGNOSIS — R002 Palpitations: Secondary | ICD-10-CM

## 2012-12-23 DIAGNOSIS — R0602 Shortness of breath: Secondary | ICD-10-CM

## 2012-12-23 MED ORDER — POTASSIUM CHLORIDE ER 10 MEQ PO TBCR: 10.0000 meq | EXTENDED_RELEASE_TABLET | Freq: Every day | ORAL | Status: DC

## 2012-12-23 MED ORDER — COLCHICINE 0.6 MG PO TABS: 0.6000 mg | ORAL_TABLET | Freq: Two times a day (BID) | ORAL | Status: DC

## 2012-12-23 MED ORDER — FUROSEMIDE 20 MG PO TABS: 20.0000 mg | ORAL_TABLET | Freq: Every day | ORAL | Status: DC

## 2012-12-23 MED ORDER — PREDNISONE 10 MG PO TABS: ORAL_TABLET | ORAL | Status: DC

## 2012-12-23 NOTE — Patient Instructions (Addendum)
We will call you to set up follow up appt with Dr. Tenny Craw.  Your physician has recommended that you wear an event monitor. Event monitors are medical devices that record the heart's electrical activity. Doctors most often [place] these monitors to diagnose arrhythmias. Arrhythmias are problems with the speed or rhythm of the heartbeat. The monitor is a small, portable device. You can wear one while you do your normal daily activities. This is usually used to diagnose what is causing palpitations/syncope (passing out).   Your physician has recommended you make the following change in your medication:   Start Prednisone 10 mg.  Take as directed. Start colchicine 0.6 mg by mouth twice daily. Start furosemide 20 mg  For 1 or 2 days then prn for swelling.   Start potassium 10 meq on days take lasix

## 2012-12-24 LAB — BRAIN NATRIURETIC PEPTIDE: Pro B Natriuretic peptide (BNP): 9 pg/mL (ref 0.0–100.0)

## 2012-12-24 LAB — BASIC METABOLIC PANEL
BUN: 21 mg/dL (ref 6–23)
CO2: 28 mEq/L (ref 19–32)
Calcium: 10 mg/dL (ref 8.4–10.5)
Chloride: 102 mEq/L (ref 96–112)
Creatinine, Ser: 0.8 mg/dL (ref 0.4–1.2)
GFR: 82.45 mL/min (ref >60.00)
Glucose, Bld: 110 mg/dL — ABNORMAL HIGH (ref 70–99)
Potassium: 4.8 mEq/L (ref 3.5–5.1)
Sodium: 138 mEq/L (ref 135–145)

## 2012-12-24 LAB — TSH: TSH: 0.22 u[IU]/mL — ABNORMAL LOW (ref 0.35–5.50)

## 2012-12-24 NOTE — Progress Notes (Signed)
HPI Patient is a 57 yo who was referred for pericardial effusion Patient had an echo done in October that showed normal LVEF  Mild diastolic dysfunction and a very small pericardial effusion  Sent for eval The patient says she has felt bad for over a yerr.  Overa a year ago she remembers have a sinus/viral infection. After this she developed CP  Told it was costocondritis. Pain constant Treated with celebrex.   SHe has also developed a dry cough.   She was seen by Jerilee Hoh.  Treated with steroids and PPI  She is now on second taper of prednisone  She is also on oxycodone.  Prior to steroids and oxycodone she was house bound On meds she notes improvement  With taper of steroids cough comes back  She also says that with taper she starts to bloat.  Wt increases over days  Can't get ring off or fit in clothes  CP 2 types  One is pleuritic  One is deeper  Deeper discomfort better with sitting up.  Patient has also had episodes of feeling her heart race. ONe occurred Nov 8.  Lasted about 1 hour.  Was dizzy  Since then has had a few more spells.   Allergies  Allergen Reactions  . Codeine     REACTION: VOMITING  . Penicillins     REACTION: RASH  . Pregabalin     REACTION: swelling and weight gain    Current Outpatient Prescriptions  Medication Sig Dispense Refill  . estradiol (VIVELLE-DOT) 0.1 MG/24HR Place 1 patch onto the skin 2 (two) times a week.       . famotidine (PEPCID) 20 MG tablet One at bedtime  30 tablet  11  . gabapentin (NEURONTIN) 100 MG capsule Take 1 capsule (100 mg total) by mouth 3 (three) times daily. One three times daily  90 capsule  2  . levothyroxine (SYNTHROID, LEVOTHROID) 100 MCG tablet Take 1 tablet (100 mcg total) by mouth daily.  30 tablet  5  . oxycodone (OXY-IR) 5 MG capsule Take 1 capsule (5 mg total) by mouth every 4 (four) hours as needed.  60 capsule  0  . pantoprazole (PROTONIX) 40 MG tablet Take 1 tablet (40 mg total) by mouth daily. Take 30-60 min before first  meal of the day  30 tablet  2  . rizatriptan (MAXALT) 10 MG tablet Take 1 tablet (10 mg total) by mouth as needed for migraine. May repeat in 2 hours if needed  10 tablet  11  . rosuvastatin (CRESTOR) 20 MG tablet Take 1 tablet (20 mg total) by mouth daily.  30 tablet  11  . Vitamin D, Ergocalciferol, (DRISDOL) 50000 UNITS CAPS capsule TAKE ONE CAPSULE BY MOUTH EVERY 7 DAYS  30 capsule  0  . zolpidem (AMBIEN) 5 MG tablet Take 1 tablet (5 mg total) by mouth at bedtime as needed for sleep.  30 tablet  3  . colchicine 0.6 MG tablet Take 1 tablet (0.6 mg total) by mouth 2 (two) times daily.  60 tablet  0  . furosemide (LASIX) 20 MG tablet Take 1 tablet (20 mg total) by mouth daily.  30 tablet  0  . PARoxetine (PAXIL) 20 MG tablet Take 1 tablet (20 mg total) by mouth every morning.  90 tablet  1  . potassium chloride (K-DUR) 10 MEQ tablet Take 1 tablet (10 mEq total) by mouth daily.  30 tablet  0  . predniSONE (DELTASONE) 10 MG tablet Take as  directed  40 tablet  0   No current facility-administered medications for this visit.    Past Medical History  Diagnosis Date  . Thyroid disease     hypothyroid  . Anxiety   . Depression   . Asthma   . Hyperlipidemia   . Headache(784.0)   . Chronic kidney disease     kidney stones, history of  . Fibromyalgia   . Gout     Past Surgical History  Procedure Laterality Date  . Breast surgery  1993    reduction  . Abdominal hysterectomy    . Knee arthroscopy      total knee  . Appendectomy    . Cholecystectomy    . Splenic artery embolization      Family History  Problem Relation Age of Onset  . Cancer Mother     breast  . Stroke Mother     "mini"  . Coronary artery disease Father     status post CABG  . Peripheral vascular disease Father   . Hypertension Father   . Hyperlipidemia Father   . Stroke Father     multiple  . Hypertension Sister   . High Cholesterol Sister   . GER disease Sister     History   Social History  .  Marital Status: Married    Spouse Name: Merlyn Albert    Number of Children: 4  . Years of Education: N/A   Occupational History  . Freight forwarder    Social History Main Topics  . Smoking status: Never Smoker   . Smokeless tobacco: Never Used  . Alcohol Use: Yes     Comment: 2-4 drinks/year  . Drug Use: No  . Sexual Activity: Yes   Other Topics Concern  . Not on file   Social History Narrative   Regular exercise: yes    Review of Systems:  All systems reviewed.  They are negative to the above problem except as previously stated.  Vital Signs: BP 108/64  Pulse 100  Ht 5\' 1"  (1.549 m)  Wt 140 lb (63.504 kg)  BMI 26.47 kg/m2  SpO2 97%  Physical Exam Patient is in NAD  Has mild dry cough HEENT:  Normocephalic, atraumatic. EOMI, PERRLA.  Neck: JVP is normal.  No bruits.  Lungs: clear to auscultation. No rales no wheezes.  Heart: Regular rate and rhythm. Normal S1, S2. No S3.   No significant murmurs. PMI not displaced.  Abdomen:  Supple, nontender. Normal bowel sounds. No masses. No hepatomegaly.  Extremities:   Good distal pulses throughout. No lower extremity edema.  Musculoskeletal :moving all extremities.  Neuro:   alert and oriented x3.  CN II-XII grossly intact.  EKG  10/4  ST 116 bpm   Assessment and Plan:  1.  Pericardia effusion  I have reviewed echo  Pericardial effusion is very small  Could be normal finding  Labs (ESR, ANA, RF, CBC are normal  TSH a little low though I am not convinced related. Question if effusion related to pulmonary symptoms Question if related to CP  Could she have had pericarditis that has not resolved  Recomm  I would give trial of colchicine.  0.6 bid.   With cough returning I would keep on prednisone 20  Has appt with Jerilee Hoh next wk With bloating with check BNP and BMET   Recheck TSH Can try lasix 20 and KCL 10  2.  Palpitations  EKG wht short PR  No delta wave  With events I would set up for event monitor.

## 2012-12-28 ENCOUNTER — Ambulatory Visit (INDEPENDENT_AMBULATORY_CARE_PROVIDER_SITE_OTHER)
Admission: RE | Admit: 2012-12-28 | Discharge: 2012-12-28 | Disposition: A | Discharge status: Home / Self Care | Payer: Managed Care, Other (non HMO) | Source: Ambulatory Visit | Attending: Adult Health | Admitting: Adult Health

## 2012-12-28 ENCOUNTER — Ambulatory Visit: Payer: Managed Care, Other (non HMO) | Admitting: Internal Medicine

## 2012-12-28 ENCOUNTER — Encounter: Payer: Self-pay | Admitting: Adult Health

## 2012-12-28 ENCOUNTER — Telehealth: Payer: Self-pay | Admitting: Internal Medicine

## 2012-12-28 ENCOUNTER — Ambulatory Visit (INDEPENDENT_AMBULATORY_CARE_PROVIDER_SITE_OTHER): Payer: Managed Care, Other (non HMO) | Admitting: Adult Health

## 2012-12-28 VITALS — BP 108/62 | HR 109 | Temp 98.6°F | Ht 61.0 in | Wt 140.2 lb

## 2012-12-28 DIAGNOSIS — J45909 Unspecified asthma, uncomplicated: Secondary | ICD-10-CM

## 2012-12-28 DIAGNOSIS — R05 Cough: Secondary | ICD-10-CM

## 2012-12-28 DIAGNOSIS — R059 Cough, unspecified: Secondary | ICD-10-CM

## 2012-12-28 MED ORDER — OXYCODONE HCL 5 MG PO CAPS: 5.0000 mg | ORAL_CAPSULE | ORAL | Status: DC | PRN

## 2012-12-28 NOTE — Patient Instructions (Signed)
Follow med calendar closely and bring to each visit.  We are setting you up for a Methacholine Challenge Test  Chest xray today . Work on cough control regimen Follow up Dr. Sherene Sires  In 2 weeks and As needed  Please contact office for sooner follow up if symptoms do not improve or worsen or seek emergency care

## 2012-12-28 NOTE — Telephone Encounter (Signed)
error 

## 2012-12-28 NOTE — Telephone Encounter (Signed)
Patient would like to get in with a provider here at [facility]. Patient previously spoke with Migdalia Dk (please see phone note from 10/30) about getting with Dr. Yetta Barre but Dr. Yetta Barre will not accept her as a new patient.  She was told that you would be following up with her in regards to a provider she could see here at [facility].

## 2012-12-29 ENCOUNTER — Telehealth: Payer: Self-pay | Admitting: Internal Medicine

## 2012-12-29 NOTE — Telephone Encounter (Signed)
Spoke with pt, aware of labs. She is in between medical doctors at this time. She is not interested in changing her synthroid at this time she reports she feels better with her fibromyalgia. She is going to get established with a PCP at the [address] office and I encouraged her to discuss with the PCP once she gets an appt. Patient voiced understanding

## 2012-12-29 NOTE — Telephone Encounter (Signed)
Follow up      Pt is calling back for her test results please.

## 2012-12-29 NOTE — Progress Notes (Signed)
Subjective:     Patient ID: Claudia Luna, female   DOB: Dec 04, 1955   MRN: [id number]   Brief patient profile:  29 yowf never smoker perfectly healthy except for  dx of fibromyalgia in 1991 referred by Dr Artist Pais to pulmonary clinic 12/02/12 for refractory cough since 10/2011   History of Present Illness  12/02/2012 1st [facility] office visit/ Wert cc acute onset September 2013  facial pain, sore throat assoc with cough yellow mucus sorta like a head cold that persisted and some better with abx and cough turned more dry but persisted ever since. Only sob when coughing. No cough to vomit. Cough to point where sore in upper abd during cough and also midline upper chest Cough is daily wakes her up and decreases some p asleep.  rec  First take delsym two tsp every 12 hours and supplement if needed with oxy ir 5 mg  up to 2 every 4 hours to suppress the urge to cough at all or even clear your throat.  .   Pantoprazole (protonix) 40 mg   Take 30-60 min before first meal of the day and Pepcid 20 mg one bedtime until return to office    GERD  Diet   Prednisone 10 mg take  4 each am x 2 days,   2 each am x 2 days,  1 each am x 2 days and stop     12/17/2012 f/u ov/Wert re: cough x one year Chief Complaint  Patient presents with  . Acute Visit    Pt states that cough had improved while on prednisone, but has worsened since she stopped med 2 days ago.  Cough is non prod, but she feels congested in her chest.     sensation of globus, "not able to cough the fur ball up"  >>Neurontin Three times a day  , cough control regimen w/ oxycodone GERD rx     12/28/12 Follow up  Returns for follow up and Med calendar .  reports cough is improved since last ov, congestion is resoved.  pred taper was extended on 11/20. Cough is better while she is using oxycodone. We discussed cough suppression regimen . Also discussed short term use of narcotic for cough control and dangers of narcotic dependence.  She verbalizes awareness.  Discussed GERD/AR /cough prevention regiemn.  Cough is mainly dry. No discolored mucus, fever, chest pain, orthopnea or edema.    Current Medications, Allergies, Complete Past Medical History, Past Surgical History, Family History, and Social History were reviewed in [employer] record.  ROS  The following are not active complaints unless bolded sore throat, dysphagia, dental problems, itching, sneezing,  nasal congestion or excess/ purulent secretions, ear ache,   fever, chills, sweats, unintended wt loss, pleuritic or exertional cp, hemoptysis,  orthopnea pnd or leg swelling, presyncope, palpitations, heartburn, abdominal pain, anorexia, nausea, vomiting, diarrhea  or change in bowel or urinary habits, change in stools or urine, dysuria,hematuria,  rash, arthralgias, visual complaints, headache, numbness weakness or ataxia or problems with walking or coordination,  change in mood/affect or memory.               Objective:   Physical Exam      HEENT: nl dentition, turbinates, and orophanx. Nl external ear canals without cough reflex   NECK :  without JVD/Nodes/TM/ nl carotid upstrokes bilaterally   LUNGS: no acc muscle use, clear to A and P bilaterally with  Cough early  on insp or  exp maneuvers   CV:  RRR  no s3 or murmur or increase in P2, no edema   ABD:  soft and nontender with nl excursion in the supine position. No bruits or organomegaly, bowel sounds nl  MS:  warm without deformities, calf tenderness, cyanosis or clubbing  SKIN: warm and dry without lesions    NEURO:  alert, approp, no deficits    Sinus CT 06/04/12 Minimal maxillary mucoperiosteal thickening.  Ct chest 11/17/12  1. No evidence of acute pulmonary embolism.  2. Nonspecific patchy ground-glass opacities in both lungs with  early mosaic attenuation pattern   Assessment:

## 2012-12-31 ENCOUNTER — Encounter: Payer: Self-pay | Admitting: Radiology

## 2012-12-31 ENCOUNTER — Encounter (INDEPENDENT_AMBULATORY_CARE_PROVIDER_SITE_OTHER): Payer: Self-pay

## 2012-12-31 DIAGNOSIS — R002 Palpitations: Secondary | ICD-10-CM

## 2012-12-31 NOTE — Progress Notes (Signed)
Patient ID: Claudia Luna, female   DOB: [birth year], 57 y.o.   MRN: [id number] [employer] 30 day monitor applied

## 2013-01-03 NOTE — Assessment & Plan Note (Addendum)
Upper airway cough  Patient's medications were reviewed today and patient education was given. Computerized medication calendar was adjusted/completed Set up for Methacholine challenge test.  Check cxr  Short term narcotic use only, advised no further prescriptions for oxycodone as this is short term use only to help with cough control.     Plan  Follow med calendar closely and bring to each visit.  We are setting you up for a Methacholine Challenge Test  Chest xray today . Work on cough control regimen Follow up Dr. Sherene Sires  In 2 weeks and As needed  Please contact office for sooner follow up if symptoms do not improve or worsen or seek emergency care

## 2013-01-06 ENCOUNTER — Ambulatory Visit (HOSPITAL_COMMUNITY)
Admission: RE | Admit: 2013-01-06 | Discharge: 2013-01-06 | Disposition: A | Discharge status: Home / Self Care | Payer: Managed Care, Other (non HMO) | Source: Ambulatory Visit | Attending: Adult Health | Admitting: Adult Health

## 2013-01-06 DIAGNOSIS — J45909 Unspecified asthma, uncomplicated: Secondary | ICD-10-CM

## 2013-01-06 LAB — PULMONARY FUNCTION TEST
FEF 25-75 Post: 2.22 L/sec
FEF 25-75 Pre: 2.33 L/sec
FEF2575-%Change-Post: -4 %
FEF2575-%Pred-Post: 94 %
FEF2575-%Pred-Pre: 99 %
FEV1-%Change-Post: 0 %
FEV1-%Pred-Post: 95 %
FEV1-%Pred-Pre: 95 %
FEV1-Post: 2.28 L
FEV1-Pre: 2.27 L
FEV1FVC-%Change-Post: 2 %
FEV1FVC-%Pred-Pre: 100 %
FEV6-%Change-Post: -1 %
FEV6-%Pred-Post: 94 %
FEV6-%Pred-Pre: 96 %
FEV6-Post: 2.79 L
FEV6-Pre: 2.84 L
FEV6FVC-%Change-Post: 0 %
FEV6FVC-%Pred-Post: 103 %
FEV6FVC-%Pred-Pre: 102 %
FVC-%Change-Post: -2 %
FVC-%Pred-Post: 91 %
FVC-%Pred-Pre: 93 %
FVC-Post: 2.79 L
FVC-Pre: 2.85 L
Post FEV1/FVC ratio: 82 %
Post FEV6/FVC ratio: 100 %
Pre FEV1/FVC ratio: 80 %
Pre FEV6/FVC Ratio: 100 %

## 2013-01-06 MED ORDER — METHACHOLINE 0.25 MG/ML NEB SOLN
2.0000 mL | Freq: Once | RESPIRATORY_TRACT | Status: AC
  Administered 2013-01-06: 0.5 mg via RESPIRATORY_TRACT

## 2013-01-06 MED ORDER — METHACHOLINE 1 MG/ML NEB SOLN
2.0000 mL | Freq: Once | RESPIRATORY_TRACT | Status: AC
  Administered 2013-01-06: 2 mg via RESPIRATORY_TRACT

## 2013-01-06 MED ORDER — METHACHOLINE 16 MG/ML NEB SOLN
2.0000 mL | Freq: Once | RESPIRATORY_TRACT | Status: AC
  Administered 2013-01-06: 32 mg via RESPIRATORY_TRACT

## 2013-01-06 MED ORDER — SODIUM CHLORIDE 0.9 % IN NEBU
3.0000 mL | INHALATION_SOLUTION | Freq: Once | RESPIRATORY_TRACT | Status: AC
  Administered 2013-01-06: 3 mL via RESPIRATORY_TRACT

## 2013-01-06 MED ORDER — METHACHOLINE 0.0625 MG/ML NEB SOLN
2.0000 mL | Freq: Once | RESPIRATORY_TRACT | Status: AC
  Administered 2013-01-06: 0.125 mg via RESPIRATORY_TRACT

## 2013-01-06 MED ORDER — METHACHOLINE 4 MG/ML NEB SOLN
2.0000 mL | Freq: Once | RESPIRATORY_TRACT | Status: AC
  Administered 2013-01-06: 8 mg via RESPIRATORY_TRACT

## 2013-01-06 MED ORDER — ALBUTEROL SULFATE (5 MG/ML) 0.5% IN NEBU
2.5000 mg | INHALATION_SOLUTION | Freq: Once | RESPIRATORY_TRACT | Status: AC
  Administered 2013-01-06: 2.5 mg via RESPIRATORY_TRACT

## 2013-01-11 ENCOUNTER — Encounter: Payer: Self-pay | Admitting: Internal Medicine

## 2013-01-11 ENCOUNTER — Ambulatory Visit (INDEPENDENT_AMBULATORY_CARE_PROVIDER_SITE_OTHER): Payer: Managed Care, Other (non HMO) | Admitting: Internal Medicine

## 2013-01-11 ENCOUNTER — Telehealth: Payer: Self-pay

## 2013-01-11 VITALS — BP 110/70 | HR 90 | Ht 61.0 in | Wt 144.0 lb

## 2013-01-11 DIAGNOSIS — R05 Cough: Secondary | ICD-10-CM

## 2013-01-11 DIAGNOSIS — R9389 Abnormal findings on diagnostic imaging of other specified body structures: Secondary | ICD-10-CM

## 2013-01-11 DIAGNOSIS — R059 Cough, unspecified: Secondary | ICD-10-CM

## 2013-01-11 DIAGNOSIS — J45909 Unspecified asthma, uncomplicated: Secondary | ICD-10-CM

## 2013-01-11 MED ORDER — BENZONATATE 200 MG PO CAPS: ORAL_CAPSULE | ORAL | Status: DC

## 2013-01-11 MED ORDER — MONTELUKAST SODIUM 10 MG PO TABS: ORAL_TABLET | ORAL | Status: DC

## 2013-01-11 MED ORDER — GABAPENTIN 300 MG PO CAPS: 300.0000 mg | ORAL_CAPSULE | Freq: Three times a day (TID) | ORAL | Status: DC

## 2013-01-11 NOTE — Telephone Encounter (Signed)
The patient came in very upset because she is hoping to see Dr.Leschber as a new patient.  I pulled up the phone note on 12/02/2012, which Dr.Jones and Dr.Leschber both stated they were unable to take her on as a new pt.  She stated she was mis-diagnosed by Dr.Yoo and refuses to go back to him.  She was upset when I relayed the phone msg to her, and she demanded a new msg be sent to the office manager and to Dr.Leschber asking why she accept her as a new pt.  I simply told her I would be happy to take her msg and forward it to the doctor and office manager.    Callback - [phone]

## 2013-01-11 NOTE — Patient Instructions (Addendum)
Be sure you are taking the pepcid 20 mg in the evening   Increase gabapentin to 300 mg three times daily  Tessilon 200 mg four times daily as per calendar  Reduce vivelle Dot to one half twice  weekly  Add singulair 10 mg one daily   See Tammy NP in 4  weeks with all your medications, even over the counter meds, separated in two separate bags, the ones you take no matter what vs the ones you stop once you feel better and take only as needed when you feel you need them.   Tammy  will generate for you a new user friendly medication calendar that will put [place] all on the same page re: your medication use.     Without this process, it simply isn't possible to assure that we are providing  your outpatient care  with  the attention to detail we feel you deserve.   If we cannot assure that you're getting that kind of care,  then we cannot manage your problem effectively from this clinic.  Once you have seen Tammy and we are sure that we're all on the same page with your medication use she will arrange follow up with me with CXR

## 2013-01-11 NOTE — Progress Notes (Signed)
Subjective:     Patient ID: Claudia Luna, female   DOB: Feb 22, 1955   MRN: [id number]   Brief patient profile:  17 yowf never smoker perfectly healthy except for  dx of fibromyalgia in 1991 referred by Dr Artist Pais to pulmonary clinic 12/02/12 for refractory cough since 10/2011 with methacholine challenge 01/06/13 causing cough but only drop in fef 2575  History of Present Illness  12/02/2012 1st [facility] office visit/ Rutilio Yellowhair cc acute onset September 2013  facial pain, sore throat assoc with cough yellow mucus sorta like a head cold that persisted and some better with abx and cough turned more dry but persisted ever since. Only sob when coughing. No cough to vomit. Cough to point where sore in upper abd during cough and also midline upper chest Cough is daily wakes her up and decreases some p asleep.  rec  First take delsym two tsp every 12 hours and supplement if needed with oxy ir 5 mg  up to 2 every 4 hours to suppress the urge to cough at all or even clear your throat.  .   Pantoprazole (protonix) 40 mg   Take 30-60 min before first meal of the day and Pepcid 20 mg one bedtime until return to office    GERD  Diet   Prednisone 10 mg take  4 each am x 2 days,   2 each am x 2 days,  1 each am x 2 days and stop     12/17/2012 f/u ov/Kym Fenter re: cough x one year Chief Complaint  Patient presents with  . Acute Visit    Pt states that cough had improved while on prednisone, but has worsened since she stopped med 2 days ago.  Cough is non prod, but she feels congested in her chest.     sensation of globus, "not able to cough the fur ball up"  >>Neurontin Three times a day , cough control regimen w/ oxycodone GERD rx     12/28/12 Follow up  Returns for follow up and Med calendar .  reports cough is improved since last ov, congestion is resolved.  pred taper was extended on 11/20. Cough is better while she is using oxycodone. We discussed cough suppression regimen . Also discussed short  term use of narcotic for cough control and dangers of narcotic dependence. She verbalizes awareness.  Discussed GERD/AR /cough prevention regiemn.  Cough is mainly dry. No discolored mucus, fever, chest pain, orthopnea or edema.  rec Follow med calendar closely and bring to each visit.  We are setting you up for a Methacholine Challenge Test > coughed but only dropped in fef 25-75 ? Better p albuterol  Work on cough control regimen    01/11/2013 f/u ov/Denorris Reust re: chronic dry cough x one year Chief Complaint  Patient presents with  . 2 week follow up    Cough has improved since last OV.  still needs oyir  at least twice daily in am w/in 30 min of rising and the 3 pm, ? Better p saba but not clear    No obvious day to day or daytime variabilty or assoc sob (unless coughing) or cp or chest tightness, subjective wheeze overt sinus or hb symptoms. No unusual exp hx or h/o childhood pna/ asthma or knowledge of premature birth.  Sleeping ok without nocturnal  or early am exacerbation  of respiratory  c/o's or need for noct saba. Also denies any obvious fluctuation of symptoms with weather or environmental changes or  other aggravating or alleviating factors except as outlined above   Current Medications, Allergies, Complete Past Medical History, Past Surgical History, Family History, and Social History were reviewed in [employer] record.  ROS  The following are not active complaints unless bolded sore throat, dysphagia, dental problems, itching, sneezing,  nasal congestion or excess/ purulent secretions, ear ache,   fever, chills, sweats, unintended wt loss, pleuritic or exertional cp, hemoptysis,  orthopnea pnd or leg swelling, presyncope, palpitations, heartburn, abdominal pain, anorexia, nausea, vomiting, diarrhea  or change in bowel or urinary habits, change in stools or urine, dysuria,hematuria,  rash, arthralgias, visual complaints, headache, numbness weakness or ataxia or  problems with walking or coordination,  change in mood/affect or memory.              Objective:   Physical Exam  Wt Readings from Last 3 Encounters:  01/11/13 144 lb (65.318 kg)  12/28/12 140 lb 3.2 oz (63.594 kg)  12/23/12 140 lb (63.504 kg)       HEENT: nl dentition, turbinates, and orophanx. Nl external ear canals without cough reflex   NECK :  without JVD/Nodes/TM/ nl carotid upstrokes bilaterally   LUNGS: no acc muscle use, clear to A and P bilaterally with  Cough early  on insp or exp maneuvers   CV:  RRR  no s3 or murmur or increase in P2, no edema   ABD:  soft and nontender with nl excursion in the supine position. No bruits or organomegaly, bowel sounds nl  MS:  warm without deformities, calf tenderness, cyanosis or clubbing  SKIN: warm and dry without lesions    NEURO:  alert, approp, no deficits    Sinus CT 06/04/12 Minimal maxillary mucoperiosteal thickening.  Ct chest 11/17/12  1. No evidence of acute pulmonary embolism.  2. Nonspecific patchy ground-glass opacities in both lungs with  early mosaic attenuation pattern   Assessment:

## 2013-01-12 NOTE — Telephone Encounter (Signed)
Sorry, i am not taking new patients at this time

## 2013-01-12 NOTE — Assessment & Plan Note (Signed)
See methacholine 01/06/13 > caused cough and drop in fef25-75 > trial of singulair rec 01/11/13

## 2013-01-12 NOTE — Assessment & Plan Note (Addendum)
Better on neurontin with inconclusive response to methacholine and to saba  Will add singulair since this is the only med for asthma that doesn't potentially contribute to cough and consider qvar 40 bid next ov if can't get completely off oxyir by then  In meantime suppress the cough with tessalon qid and try on one half vivelle dot in case this is partly non acid gerd induced uacs     Each maintenance medication was reviewed in detail including most importantly the difference between maintenance and as needed and under what circumstances the prns are to be used. This was done in the context of a medication calendar review which provided the patient with a user-friendly unambiguous mechanism for medication administration and reconciliation and provides an action plan for all active problems. It is critical that this be shown to every doctor  for modification during the office visit if necessary so the patient can use it as a working document.

## 2013-01-12 NOTE — Telephone Encounter (Signed)
Called pt,  lvmom on number provided that relayed the msg   Thanks!!

## 2013-01-12 NOTE — Assessment & Plan Note (Addendum)
CT Chest 11/1512 1. No evidence of acute pulmonary embolism.  2. Nonspecific patchy ground-glass opacities in both lungs with  early mosaic attenuation pattern   A completely non specific pattern but Needs f/u cxr next ov

## 2013-02-01 ENCOUNTER — Other Ambulatory Visit: Payer: Self-pay | Admitting: Internal Medicine

## 2013-02-08 ENCOUNTER — Encounter: Payer: Managed Care, Other (non HMO) | Admitting: Adult Health

## 2013-02-11 ENCOUNTER — Telehealth: Payer: Self-pay | Admitting: *Deleted

## 2013-02-11 NOTE — Telephone Encounter (Signed)
Left message for pt to call, monitor reviewed by dr Tenny Crawross shows no arrhythmia when the pt senses palpitations. She senses sinus tachycardia. Rec: no change in regimen.

## 2013-02-18 ENCOUNTER — Telehealth: Payer: Self-pay | Admitting: Adult Health

## 2013-02-18 ENCOUNTER — Ambulatory Visit (INDEPENDENT_AMBULATORY_CARE_PROVIDER_SITE_OTHER): Payer: Managed Care, Other (non HMO) | Admitting: Adult Health

## 2013-02-18 ENCOUNTER — Encounter: Payer: Self-pay | Admitting: Adult Health

## 2013-02-18 VITALS — BP 114/66 | HR 89 | Temp 97.4°F | Ht 61.0 in | Wt 151.4 lb

## 2013-02-18 DIAGNOSIS — R059 Cough, unspecified: Secondary | ICD-10-CM

## 2013-02-18 DIAGNOSIS — R05 Cough: Secondary | ICD-10-CM

## 2013-02-18 DIAGNOSIS — R058 Other specified cough: Secondary | ICD-10-CM

## 2013-02-18 MED ORDER — BENZONATATE 100 MG PO CAPS: ORAL_CAPSULE | ORAL | Status: DC

## 2013-02-18 NOTE — Patient Instructions (Signed)
Follow med calendar closely and bring to each visit.  Work on cough control regimen Restart Chlortrimeton 4mg   2 At bedtime   Follow up Dr. Sherene SiresWert  In 4  weeks and As needed  Please contact office for sooner follow up if symptoms do not improve or worsen or seek emergency care

## 2013-02-18 NOTE — Telephone Encounter (Signed)
pt aware of results, question when to follow up with dr Tenny Crawross

## 2013-02-20 NOTE — Telephone Encounter (Signed)
Set f/u for end of early Feb.

## 2013-02-21 NOTE — Telephone Encounter (Signed)
Left message for pt to call to schedule

## 2013-02-21 NOTE — Telephone Encounter (Signed)
Phone note created in error. 

## 2013-02-25 NOTE — Progress Notes (Addendum)
Subjective:     Patient ID: Claudia Luna, female   DOB: 03/04/1955   MRN: [id number]   Brief patient profile:  83 yowf never smoker perfectly healthy except for  dx of fibromyalgia in 1991 referred by Dr Artist Pais to pulmonary clinic 12/02/12 for refractory cough since 10/2011 with methacholine challenge 01/06/13 causing cough but only drop in fef 2575  History of Present Illness  12/02/2012 1st [facility] office visit/ Wert cc acute onset September 2013  facial pain, sore throat assoc with cough yellow mucus sorta like a head cold that persisted and some better with abx and cough turned more dry but persisted ever since. Only sob when coughing. No cough to vomit. Cough to point where sore in upper abd during cough and also midline upper chest Cough is daily wakes her up and decreases some p asleep.  rec  First take delsym two tsp every 12 hours and supplement if needed with oxy ir 5 mg  up to 2 every 4 hours to suppress the urge to cough at all or even clear your throat.  .   Pantoprazole (protonix) 40 mg   Take 30-60 min before first meal of the day and Pepcid 20 mg one bedtime until return to office    GERD  Diet   Prednisone 10 mg take  4 each am x 2 days,   2 each am x 2 days,  1 each am x 2 days and stop     12/17/2012 f/u ov/Wert re: cough x one year Chief Complaint  Patient presents with  . Acute Visit    Pt states that cough had improved while on prednisone, but has worsened since she stopped med 2 days ago.  Cough is non prod, but she feels congested in her chest.     sensation of globus, "not able to cough the fur ball up"  >>Neurontin Three times a day , cough control regimen w/ oxycodone GERD rx     12/28/12 Follow up  Returns for follow up and Med calendar .  reports cough is improved since last ov, congestion is resolved.  pred taper was extended on 11/20. Cough is better while she is using oxycodone. We discussed cough suppression regimen . Also discussed short  term use of narcotic for cough control and dangers of narcotic dependence. She verbalizes awareness.  Discussed GERD/AR /cough prevention regiemn.  Cough is mainly dry. No discolored mucus, fever, chest pain, orthopnea or edema.  rec Follow med calendar closely and bring to each visit.  We are setting you up for a Methacholine Challenge Test > coughed but only dropped in fef 25-75 ? Better p albuterol  Work on cough control regimen    01/11/2013 f/u ov/Wert re: chronic dry cough x one year Chief Complaint  Patient presents with  . 2 week follow up    Cough has improved since last OV.  still needs oyir  at least twice daily in am w/in 30 min of rising and the 3 pm, ? Better p saba but not clear  >>gabapentin 300mg  Three times a day    02/18/13  Follow up and Med review  Returns for follow up and med review .  Pt brought all meds with her today. Reviewed all meds and organized them into a med calendar with pt education.  Reports cough is 80% improved overall, though does report some increased cough x2.5 weeks; stopped the Chlor-tabs d/t ineffectiveness. stil has some post nasal drip  No  chest pain, orthopnea, dyspnea or edema.     Current Medications, Allergies, Complete Past Medical History, Past Surgical History, Family History, and Social History were reviewed in [facility] electronic medical record.  ROS  The following are not active complaints unless bolded sore throat, dysphagia, dental problems, itching, sneezing,  nasal congestion or excess/ purulent secretions, ear ache,   fever, chills, sweats, unintended wt loss, pleuritic or exertional cp, hemoptysis,  orthopnea pnd or leg swelling, presyncope, palpitations, heartburn, abdominal pain, anorexia, nausea, vomiting, diarrhea  or change in bowel or urinary habits, change in stools or urine, dysuria,hematuria,  rash, arthralgias, visual complaints, headache, numbness weakness or ataxia or problems with walking or coordination,   change in mood/affect or memory.              Objective:   Physical Exam      HEENT: nl dentition, turbinates, and orophanx. Nl external ear canals without cough reflex   NECK :  without JVD/Nodes/TM/ nl carotid upstrokes bilaterally   LUNGS: no acc muscle use, clear to A and P bilaterally with  Cough early  on insp or exp maneuvers   CV:  RRR  no s3 or murmur or increase in P2, no edema   ABD:  soft and nontender with nl excursion in the supine position. No bruits or organomegaly, bowel sounds nl  MS:  warm without deformities, calf tenderness, cyanosis or clubbing  SKIN: warm and dry without lesions    NEURO:  alert, approp, no deficits    Sinus CT 06/04/12 Minimal maxillary mucoperiosteal thickening.  Ct chest 11/17/12  1. No evidence of acute pulmonary embolism.  2. Nonspecific patchy ground-glass opacities in both lungs with  early mosaic attenuation pattern   Assessment:

## 2013-02-25 NOTE — Assessment & Plan Note (Signed)
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 Computerized medication calendar was adjusted/completed   Plan   Follow med calendar closely and bring to each visit.  Work on cough control regimen Restart Chlortrimeton 4mg   2 At bedtime   Follow up Dr. Sherene SiresWert  In 4  weeks and As needed  Please contact office for sooner follow up if symptoms do not improve or worsen or seek emergency care

## 2013-02-28 NOTE — Addendum Note (Signed)
Addended by: Boone MasterJONES, Sandip Power E on: 02/28/2013 09:51 AM   Modules accepted: Orders, Medications

## 2013-03-07 ENCOUNTER — Other Ambulatory Visit: Payer: Self-pay | Admitting: Physical Medicine and Rehabilitation

## 2013-03-07 DIAGNOSIS — M5416 Radiculopathy, lumbar region: Secondary | ICD-10-CM

## 2013-03-15 ENCOUNTER — Ambulatory Visit: Payer: Self-pay | Admitting: Internal Medicine

## 2013-03-18 ENCOUNTER — Ambulatory Visit: Payer: Managed Care, Other (non HMO) | Admitting: Internal Medicine

## 2013-04-07 ENCOUNTER — Other Ambulatory Visit: Payer: Self-pay | Admitting: Internal Medicine

## 2013-05-28 ENCOUNTER — Other Ambulatory Visit: Payer: Managed Care, Other (non HMO)

## 2013-05-31 ENCOUNTER — Ambulatory Visit: Payer: Managed Care, Other (non HMO) | Admitting: Internal Medicine

## 2013-06-12 NOTE — Progress Notes (Signed)
HPI Patient is a 58 yo who was referred for pericardial effusion Patient had an echo done in October that showed normal LVEF  Mild diastolic dysfunction and a very small pericardial effusion  Sent for eval The patient says she has felt bad for over a yerr.  Overa a year ago she remembers have a sinus/viral infection. After this she developed CP  Told it was costocondritis. Pain constant Treated with celebrex.   SHe has also developed a dry cough.   She was seen by Selinda Orion.  Treated with steroids and PPI  She is now on second taper of prednisone  She is also on oxycodone.  Prior to steroids and oxycodone she was house bound On meds she notes improvement  With taper of steroids cough comes back  She also says that with taper she starts to bloat.  Wt increases over days  Can't get ring off or fit in clothes  CP 2 types  One is pleuritic  One is deeper  Deeper discomfort better with sitting up.  Patient has also had episodes of feeling her heart race. ONe occurred Nov 8.  Lasted about 1 hour.  Was dizzy  Since then has had a few more spells.   Allergies  Allergen Reactions  . Codeine     REACTION: VOMITING  . Penicillins     REACTION: RASH  . Pregabalin     REACTION: swelling and weight gain    Current Outpatient Prescriptions  Medication Sig Dispense Refill  . benzonatate (TESSALON) 100 MG capsule 1-2 capsules by mouth four times daily  100 capsule  0  . Biotin 10 MG CAPS Take 1 capsule by mouth every morning.      . bisacodyl (DULCOLAX) 5 MG EC tablet Take 10 mg by mouth at bedtime.      . chlorpheniramine (CHLOR-TRIMETON) 4 MG tablet Take 8 mg by mouth at bedtime.      Marland Kitchen dextromethorphan (DELSYM) 30 MG/5ML liquid 2 tsp twice daily as needed for cough      . estradiol (MINIVELLE) 0.05 MG/24HR patch Place 1 patch onto the skin 2 (two) times a week. On Monday/Thursday      . famotidine (PEPCID) 20 MG tablet Take 1 tablet by mouth at bedtime.      . furosemide (LASIX) 20 MG tablet TAKE 1  TABLET BY MOUTH DAILY  30 tablet  6  . gabapentin (NEURONTIN) 300 MG capsule Take 1 capsule (300 mg total) by mouth 3 (three) times daily.  90 capsule  11  . ibuprofen (ADVIL,MOTRIN) 200 MG tablet 4 tabs three times daily as needed for joint pain - take with food      . KLOR-CON 10 10 MEQ tablet TAKE 1 TABLET BY MOUTH DAILY  30 tablet  6  . levothyroxine (SYNTHROID, LEVOTHROID) 100 MCG tablet Take 1 tablet (100 mcg total) by mouth daily.  30 tablet  5  . Magnesium 250 MG TABS Take 1 tablet by mouth every morning.      . montelukast (SINGULAIR) 10 MG tablet One at bedtime every night  30 tablet  2  . Multiple Vitamins-Minerals (CENTRUM SILVER ADULT 50+) TABS Take 1 tablet by mouth every morning.      . OXYCONTIN 15 MG T12A 12 hr tablet Take 1 tablet by mouth 2 (two) times daily.      . pantoprazole (PROTONIX) 40 MG tablet TAKE 1 TABLET DAILY. TAKE 30 TO 60 MINUTES BEFORE FIRST MEAL OF THE DAY.  E[place]ott  tablet  1  . PARoxetine (PAXIL) 20 MG tablet Take 20 mg by mouth at bedtime.      . Probiotic Product (PROBIOTIC DAILY) CAPS Take 1 capsule by mouth every morning.      . pyridOXINE (VITAMIN B-6) 100 MG tablet Take 100 mg by mouth every morning.      . rizatriptan (MAXALT) 10 MG tablet Take 1 tablet by mouth daily as needed. For headache      . rosuvastatin (CRESTOR) 20 MG tablet Take 1 tablet (20 mg total) by mouth daily.  30 tablet  11  . valACYclovir (VALTREX) 1000 MG tablet 1 daily as directed when needed for cold sores      . vitamin B-12 (CYANOCOBALAMIN) 1000 MCG tablet Take 1,000 mcg by mouth every morning.      . zolpidem (AMBIEN) 5 MG tablet Take 1 tablet (5 mg total) by mouth at bedtime as needed for sleep.  30 tablet  3   No current facility-administered medications for this visit.    Past Medical History  Diagnosis Date  . Thyroid disease     hypothyroid  . Anxiety   . Depression   . Asthma   . Hyperlipidemia   . Headache(784.0)   . Chronic kidney disease     kidney stones,  history of  . Fibromyalgia   . Gout     Past Surgical History  Procedure Laterality Date  . Breast surgery  1993    reduction  . Abdominal hysterectomy    . Knee arthroscopy      total knee  . Appendectomy    . Cholecystectomy    . Splenic artery embolization      Family History  Problem Relation Age of Onset  . Cancer Mother     breast  . Stroke Mother     "mini"  . Coronary artery disease Father     status post CABG  . Peripheral vascular disease Father   . Hypertension Father   . Hyperlipidemia Father   . Stroke Father     multiple  . Hypertension Sister   . High Cholesterol Sister   . GER disease Sister     History   Social History  . Marital Status: Married    Spouse Name: Josph Macho    Number of Children: 4  . Years of Education: N/A   Occupational History  . Location manager    Social History Main Topics  . Smoking status: Never Smoker   . Smokeless tobacco: Never Used  . Alcohol Use: Yes     Comment: 2-4 drinks/year  . Drug Use: No  . Sexual Activity: Yes   Other Topics Concern  . Not on file   Social History Narrative   Regular exercise: yes    Review of Systems:  All systems reviewed.  They are negative to the above problem except as previously stated.  Vital Signs: There were no vitals taken for this visit.  Physical Exam Patient is in NAD  Has mild dry cough HEENT:  Normocephalic, atraumatic. EOMI, PERRLA.  Neck: JVP is normal.  No bruits.  Lungs: clear to auscultation. No rales no wheezes.  Heart: Regular rate and rhythm. Normal S1, S2. No S3.   No significant murmurs. PMI not displaced.  Abdomen:  Supple, nontender. Normal bowel sounds. No masses. No hepatomegaly.  Extremities:   Good distal pulses throughout. No lower extremity edema.  Musculoskeletal :moving all extremities.  Neuro:   alert and oriented x3.  CN II-XII grossly intact.  EKG  10/4  ST 116 bpm   Assessment and Plan:  1.  Pericardia effusion  I have  reviewed echo  Pericardial effusion is very small  Could be normal finding  Labs (ESR, ANA, RF, CBC are normal  TSH a little low though I am not convinced related. Question if effusion related to pulmonary symptoms Question if related to CP  Could she have had pericarditis that has not resolved  Recomm  I would give trial of colchicine.  0.6 bid.   With cough returning I would keep on prednisone 20  Has appt with Selinda Orion next wk With bloating with check BNP and BMET   Recheck TSH Can try lasix 20 and KCL 10  2.  Palpitations  EKG wht short PR  No delta wave  With events I would set up for event monitor.

## 2013-06-17 ENCOUNTER — Encounter: Payer: Managed Care, Other (non HMO) | Admitting: Internal Medicine

## 2013-06-18 ENCOUNTER — Other Ambulatory Visit: Payer: Self-pay | Admitting: Internal Medicine

## 2013-07-04 NOTE — Progress Notes (Signed)
This encounter was created in error - please disregard.

## 2013-07-13 ENCOUNTER — Encounter: Payer: Self-pay | Admitting: Internal Medicine

## 2013-08-05 ENCOUNTER — Other Ambulatory Visit: Payer: Self-pay | Admitting: Internal Medicine

## 2013-09-07 ENCOUNTER — Other Ambulatory Visit: Payer: Self-pay | Admitting: Internal Medicine

## 2013-10-12 ENCOUNTER — Other Ambulatory Visit: Payer: Self-pay | Admitting: Internal Medicine

## 2013-10-14 ENCOUNTER — Other Ambulatory Visit: Payer: Self-pay | Admitting: Internal Medicine

## 2013-10-20 ENCOUNTER — Ambulatory Visit (INDEPENDENT_AMBULATORY_CARE_PROVIDER_SITE_OTHER): Payer: Managed Care, Other (non HMO) | Admitting: Physician Assistant

## 2013-10-20 ENCOUNTER — Encounter: Payer: Self-pay | Admitting: Physician Assistant

## 2013-10-20 ENCOUNTER — Telehealth: Payer: Self-pay | Admitting: Internal Medicine

## 2013-10-20 VITALS — BP 90/60 | HR 96 | Temp 98.5°F | Resp 18 | Wt 148.9 lb

## 2013-10-20 DIAGNOSIS — B370 Candidal stomatitis: Secondary | ICD-10-CM

## 2013-10-20 DIAGNOSIS — G43909 Migraine, unspecified, not intractable, without status migrainosus: Secondary | ICD-10-CM

## 2013-10-20 DIAGNOSIS — Z8619 Personal history of other infectious and parasitic diseases: Secondary | ICD-10-CM

## 2013-10-20 DIAGNOSIS — Z87898 Personal history of other specified conditions: Secondary | ICD-10-CM

## 2013-10-20 MED ORDER — SCOPOLAMINE 1 MG/3DAYS TD PT72: 1.0000 | MEDICATED_PATCH | TRANSDERMAL | Status: DC

## 2013-10-20 MED ORDER — RIZATRIPTAN BENZOATE 10 MG PO TABS: 10.0000 mg | ORAL_TABLET | Freq: Every day | ORAL | Status: DC | PRN

## 2013-10-20 MED ORDER — VALACYCLOVIR HCL 1 G PO TABS: ORAL_TABLET | ORAL | Status: DC

## 2013-10-20 MED ORDER — MAGIC MOUTHWASH: 5.0000 mL | Freq: Four times a day (QID) | ORAL | Status: DC | PRN

## 2013-10-20 NOTE — Progress Notes (Signed)
Subjective:    Patient ID: Claudia Luna, female    DOB: December 03, 1955, 58 y.o.   MRN: [id number]  HPI Patient is a 58 y.o. female presenting for medication management. Pt is going out of town on a cruise tomorrow. She is requesting refills on some of her medications.   Scolpamine patch- she had taken this in the past whenever she traveled. She states that she tolerated this well, and denies any adverse effects.  Valtrex- She takes this for cold sores, and states it works well. She states that she has outbreaks usually about twice per year. She states that she tolerates the medication well and denies adverse effects to treatment.  Magic mouthwash- she states that she has a history of Thrush due to being immunocompromised from her fibromyalgia. She states that she recently started having another episode, and has been using her old prescription of mouthwash, and states it is helping, however she only had enough for a few doses, and needs a refill. She denies adverse effects to treatment and believes she tolerates this well.  Maxalt- she had taken this previously  prn for her migraines. She states that her PCP had been prescribing this about 2 years ago, however she started seeing a neurologist, and has been getting her prescription filled through them. She states that her last visit with Neurology was about 1 year ago. She states that she had not scheduled a follow up appointment because her migraines have not been as bad for the majority of the last year. She states that she has had a few migraines, however not as bad or as often as last year. She tolerates the Maxalt well and denies adverse effects to treatment.   Patient denies fevers, chills, nausea, vomiting, diarrhea, shortness of breath, chest pain, syncope.   Review of Systems As per HPI and are otherwise negative.   Past Medical History  Diagnosis Date  . Thyroid disease     hypothyroid  . Anxiety   . Depression   .  Asthma   . Hyperlipidemia   . Headache(784.0)   . Chronic kidney disease     kidney stones, history of  . Fibromyalgia   . Gout     History   Social History  . Marital Status: Married    Spouse Name: Merlyn Albert    Number of Children: 4  . Years of Education: N/A   Occupational History  . Freight forwarder    Social History Main Topics  . Smoking status: Never Smoker   . Smokeless tobacco: Never Used  . Alcohol Use: Yes     Comment: 2-4 drinks/year  . Drug Use: No  . Sexual Activity: Yes   Other Topics Concern  . Not on file   Social History Narrative   Regular exercise: yes    Past Surgical History  Procedure Laterality Date  . Breast surgery  1993    reduction  . Abdominal hysterectomy    . Knee arthroscopy      total knee  . Appendectomy    . Cholecystectomy    . Splenic artery embolization      Family History  Problem Relation Age of Onset  . Cancer Mother     breast  . Stroke Mother     "mini"  . Coronary artery disease Father     status post CABG  . Peripheral vascular disease Father   . Hypertension Father   . Hyperlipidemia Father   . Stroke Father  multiple  . Hypertension Sister   . High Cholesterol Sister   . GER disease Sister     Allergies  Allergen Reactions  . Codeine     REACTION: VOMITING  . Penicillins     REACTION: RASH  . Pregabalin     REACTION: swelling and weight gain    Current Outpatient Prescriptions on File Prior to Visit  Medication Sig Dispense Refill  . bisacodyl (DULCOLAX) 5 MG EC tablet Take 10 mg by mouth at bedtime.      . chlorpheniramine (CHLOR-TRIMETON) 4 MG tablet Take 8 mg by mouth at bedtime.      . CRESTOR 20 MG tablet TAKE 1 TABLET BY MOUTH ONCE A DAY  30 tablet  0  . estradiol (MINIVELLE) 0.05 MG/24HR patch Place 1 patch onto the skin 2 (two) times a week. On Monday/Thursday      . furosemide (LASIX) 20 MG tablet TAKE 1 TABLET BY MOUTH DAILY  30 tablet  6  . gabapentin (NEURONTIN) 300 MG  capsule Take 1 capsule (300 mg total) by mouth 3 (three) times daily.  90 capsule  11  . ibuprofen (ADVIL,MOTRIN) 200 MG tablet 4 tabs three times daily as needed for joint pain - take with food      . KLOR-CON 10 10 MEQ tablet TAKE 1 TABLET BY MOUTH DAILY  30 tablet  6  . OXYCONTIN 15 MG T12A 12 hr tablet Take 1 tablet by mouth 2 (two) times daily.      Marland Kitchen PARoxetine (PAXIL) 20 MG tablet TAKE 1 TABLET BY MOUTH EVERY MORNING  30 tablet  0  . pyridOXINE (VITAMIN B-6) 100 MG tablet Take 100 mg by mouth every morning.      Marland Kitchen SYNTHROID 100 MCG tablet TAKE 1 TABLET BY MOUTH DAILY  30 tablet  0  . Vitamin D, Ergocalciferol, (DRISDOL) 50000 UNITS CAPS capsule       . Vitamin D, Ergocalciferol, (DRISDOL) 50000 UNITS CAPS capsule TAKE 1 CAPSULE BY MOUTH EVERY 7 DAYS  30 capsule  0  . zolpidem (AMBIEN) 5 MG tablet Take 1 tablet (5 mg total) by mouth at bedtime as needed for sleep.  30 tablet  3  . benzonatate (TESSALON) 100 MG capsule 1-2 capsules by mouth four times daily  100 capsule  0  . Biotin 10 MG CAPS Take 1 capsule by mouth every morning.      Marland Kitchen dextromethorphan (DELSYM) 30 MG/5ML liquid 2 tsp twice daily as needed for cough      . Magnesium 250 MG TABS Take 1 tablet by mouth every morning.      . montelukast (SINGULAIR) 10 MG tablet One at bedtime every night  30 tablet  2  . Multiple Vitamins-Minerals (CENTRUM SILVER ADULT 50+) TABS Take 1 tablet by mouth every morning.      . pantoprazole (PROTONIX) 40 MG tablet TAKE 1 TABLET DAILY. TAKE 30 TO 60 MINUTES BEFORE FIRST MEAL OF THE DAY.  30 tablet  1  . Probiotic Product (PROBIOTIC DAILY) CAPS Take 1 capsule by mouth every morning.      . vitamin B-12 (CYANOCOBALAMIN) 1000 MCG tablet Take 1,000 mcg by mouth every morning.       No current facility-administered medications on file prior to visit.    EXAM: BP 90/60  Pulse 96  Temp(Src) 98.5 F (36.9 C) (Oral)  Resp 18  Wt 148 lb 14.4 oz (67.541 kg)      Objective:   Physical Exam  Nursing note and vitals reviewed. Constitutional: She is oriented to person, place, and time. She appears well-developed and well-nourished. No distress.  HENT:  Head: Normocephalic and atraumatic.  Oropharynx moist, slightly erythematous. No pharyngeal exudate. White patches visible on buccal mucosa.  Eyes: Conjunctivae and EOM are normal. Pupils are equal, round, and reactive to light.  Neck: Normal range of motion. Neck supple.  Cardiovascular: Normal rate, regular rhythm and intact distal pulses.   Pulmonary/Chest: Effort normal and breath sounds normal. No stridor. No respiratory distress. She has no wheezes. She has no rales. She exhibits no tenderness.  Musculoskeletal: Normal range of motion.  Lymphadenopathy:    She has no cervical adenopathy.  Neurological: She is alert and oriented to person, place, and time.  Skin: Skin is warm and dry. She is not diaphoretic. No pallor.  Psychiatric: She has a normal mood and affect. Her behavior is normal. Judgment and thought content normal.      Lab Results  Component Value Date   WBC 9.1 11/16/2012   HGB 15.2* 11/16/2012   HCT 44.5 11/16/2012   PLT 305.0 11/16/2012   GLUCOSE 110* 12/23/2012   CHOL 188 08/04/2011   TRIG 83.0 08/04/2011   HDL 67.70 08/04/2011   LDLDIRECT 112.3 06/27/2008   LDLCALC 104* 08/04/2011   ALT 14 11/16/2012   AST 19 11/16/2012   NA 138 12/23/2012   K 4.8 12/23/2012   CL 102 12/23/2012   CREATININE 0.8 12/23/2012   BUN 21 12/23/2012   CO2 28 12/23/2012   TSH 0.22* 12/23/2012         Assessment & Plan:  Amori was seen today for medication refill.  Diagnoses and associated orders for this visit:  H/O motion sickness Comments: Has used Scopolamine patch successfully in the past. Will provide. - scopolamine (TRANSDERM-SCOP) 1 MG/3DAYS; Place 1 patch (1.5 mg total) onto the skin every 3 (three) days.  H/O cold sores Comments: Treated with Valtrex regimen, will continue. - valACYclovir (VALTREX) 1000  MG tablet; 1 daily as directed when needed for cold sores  Migraine without status migrainosus, not intractable, unspecified migraine type Comments: Maxalt effective, will continue. - rizatriptan (MAXALT) 10 MG tablet; Take 1 tablet (10 mg total) by mouth daily as needed. For headache  Thrush Comments: Immunocompromised from fibromyalgia. Uses magic mouthwash successfully, will refill. - Alum & Mag Hydroxide-Simeth (MAGIC MOUTHWASH) SOLN; Take 5 mLs by mouth 4 (four) times daily as needed for mouth pain.    Return precautions provided, and patient handout on migraine, thrush, motion sickness.  Plan to follow up as needed, or for worsening or persistent symptoms despite treatment.  Patient Instructions  Your medications should be available at your pharmacy and have either been called in or sent electronically.   Continue taking them as directed.  If emergency symptoms discussed during visit developed, seek medical attention immediately.  Followup as needed, or for worsening or persistent symptoms despite treatment.

## 2013-10-20 NOTE — Telephone Encounter (Signed)
Pt needs an OV  

## 2013-10-20 NOTE — Patient Instructions (Addendum)
Your medications should be available at your pharmacy and have either been called in or sent electronically.   Continue taking them as directed.  If emergency symptoms discussed during visit developed, seek medical attention immediately.  Followup as needed, or for worsening or persistent symptoms despite treatment.   Motion Sickness Motion sickness can happen when you travel by boat, car, airplane, or on an amusement park ride. You may feel dizzy, sick to your stomach (nauseous), or you may throw up (vomit). These problems usually go away when the motion or travel stops. HOME CARE   Only take medicines as told by your doctor.  If you wear a motion sickness patch, wash your hands after you put the patch on.  Avoid activities that cause your motion sickness.  Consider taking medicines for motion sickness before you travel. Your doctor can suggest medicines.  Do not eat large meals or drink alcohol before or during travel.  Take small sips of liquids as needed.  Sit in the area of the airplane or boat with the least motion. On an airplane, sit near the wing. Lie back in your seat. Avoid sitting in the backseat of a car.  Breathe slowly and deeply.  Do not read or focus on nearby objects. Watching the horizon can be helpful, especially on a boat.  Avoid areas where people are smoking. GET HELP RIGHT AWAY IF:   You still throw up or feel sick to your stomach after you take medicine and rest.  You see blood in your throw up. It might look dark red or like coffee grounds.  You pass out (faint) or feel dizzy when you stand up.  You have a fever.  You have bad belly (abdominal) pain or chest pain.  You have trouble breathing.  You have a bad headache.  You lose feeling (numbness) or feel weak on one side of the body.  You have trouble speaking. MAKE SURE YOU:   Understand these instructions.  Will watch your condition.  Will get help right away if you are not doing  well or get worse. Document Released: 01/09/2011 Document Revised: 04/14/2011 Document Reviewed: 01/09/2011 [facility] Patient Information 2015 [place], [place]. This information is not intended to replace advice given to you by your health care provider. Make sure you discuss any questions you have with your health care provider. Thrush, Adult  Claudia Luna is an infection that can happen on the mouth, throat, tongue, or other areas. It causes white patches to form on the mouth and tongue. HOME CARE  Only take medicine as told by your doctor. You may be given medicine to swallow or to apply right on the area.  Eat plain yogurt that contains live cultures (check the label).  Rinse your mouth many times a day with a warm saltwater rinse. To make the rinse, mix 1 teaspoon (6 g) of salt in 8 ounces (0.2 L) of warm water. To reduce pain:  Drink cold liquids such as water or iced tea.  Eat frozen ice pops or frozen juices.  Eat foods that are easy to swallow, such as gelatin or ice cream.  Drink from a straw if the patches are painful. If you are breastfeeding:  Clean your nipples with an antifungal medicine.  Dry your nipples after breastfeeding.  Use an ointment called lanolin to help relieve nipple soreness. If you wear dentures:  Take out your dentures before going to bed.  Brush them thoroughly.  Soak them in a denture cleaner. GET HELP  IF:   Your problems are getting worse.  Your problems are not improving within 7 days of starting treatment.  Your infection is spreading. This may show as white patches on the skin outside of your mouth.  You are nursing and have redness and pain in the nipples. MAKE SURE YOU:  Understand these instructions.  Will watch your condition.  Will get help right away if you are not doing well or get worse. Document Released: 04/16/2009 Document Revised: 11/10/2012 Document Reviewed: 08/23/2012 [facility] Patient Information 2015 [place], [place].  This information is not intended to replace advice given to you by your health care provider. Make sure you discuss any questions you have with your health care provider. Migraine Headache A migraine headache is very bad, throbbing pain on one or both sides of your head. Talk to your doctor about what things may bring on (trigger) your migraine headaches. HOME CARE  Only take medicines as told by your doctor.  Lie down in a dark, quiet room when you have a migraine.  Keep a journal to find out if certain things bring on migraine headaches. For example, write down:  What you eat and drink.  How much sleep you get.  Any change to your diet or medicines.  Lessen how much alcohol you drink.  Quit smoking if you smoke.  Get enough sleep.  Lessen any stress in your life.  Keep lights dim if bright lights bother you or make your migraines worse. GET HELP RIGHT AWAY IF:   Your migraine becomes really bad.  You have a fever.  You have a stiff neck.  You have trouble seeing.  Your muscles are weak, or you lose muscle control.  You lose your balance or have trouble walking.  You feel like you will pass out (faint), or you pass out.  You have really bad symptoms that are different than your first symptoms. MAKE SURE YOU:   Understand these instructions.  Will watch your condition.  Will get help right away if you are not doing well or get worse. Document Released: 10/30/2007 Document Revised: 04/14/2011 Document Reviewed: 09/27/2012 [facility] Patient Information 2015 [place], [place]. This information is not intended to replace advice given to you by your health care provider. Make sure you discuss any questions you have with your health care provider.

## 2013-10-20 NOTE — Telephone Encounter (Signed)
[facility] - [place], [place] - [address] AT [facility] [address] is requesting re-fill on MAGIC MOUTHWASH last filled over a year ago.

## 2013-10-20 NOTE — Telephone Encounter (Signed)
Pt is scheduled to see Claudia Luna today

## 2013-11-09 ENCOUNTER — Other Ambulatory Visit: Payer: Self-pay | Admitting: Physical Medicine and Rehabilitation

## 2013-11-09 DIAGNOSIS — M79601 Pain in right arm: Secondary | ICD-10-CM

## 2013-11-24 ENCOUNTER — Other Ambulatory Visit: Payer: Self-pay | Admitting: Internal Medicine

## 2013-11-24 ENCOUNTER — Other Ambulatory Visit: Payer: Self-pay | Admitting: Physician Assistant

## 2013-11-30 LAB — HM MAMMOGRAPHY: HM Mammogram: NORMAL

## 2013-12-01 ENCOUNTER — Other Ambulatory Visit: Payer: Managed Care, Other (non HMO)

## 2013-12-05 ENCOUNTER — Ambulatory Visit: Payer: Managed Care, Other (non HMO) | Admitting: Family Medicine

## 2013-12-07 ENCOUNTER — Encounter: Payer: Self-pay | Admitting: Internal Medicine

## 2014-01-05 ENCOUNTER — Ambulatory Visit
Admission: RE | Admit: 2014-01-05 | Discharge: 2014-01-05 | Disposition: A | Discharge status: Home / Self Care | Payer: Managed Care, Other (non HMO) | Source: Ambulatory Visit | Attending: Physical Medicine and Rehabilitation | Admitting: Physical Medicine and Rehabilitation

## 2014-01-05 DIAGNOSIS — M79601 Pain in right arm: Secondary | ICD-10-CM

## 2014-01-13 ENCOUNTER — Ambulatory Visit: Payer: Managed Care, Other (non HMO) | Admitting: Internal Medicine

## 2014-01-16 ENCOUNTER — Telehealth: Payer: Self-pay | Admitting: Internal Medicine

## 2014-01-16 NOTE — Telephone Encounter (Signed)
Pt missed her appt on Friday and need to get refills on the following med. Dr Artist PaisYoo does not have any appts can she see someone else to have these meds refill     PARoxetine (PAXIL) 20 MG tablet,CRESTOR 20 MG tablet,SYNTHROID 100 MCG tablet   phone number.  [phone]

## 2014-01-16 NOTE — Telephone Encounter (Signed)
Pt has been scheduled with Dr Fry 

## 2014-01-16 NOTE — Telephone Encounter (Signed)
Okay to see someone else.  Dr Artist PaisYoo does not have any openings this week and she has not been seen in a year

## 2014-01-18 ENCOUNTER — Encounter: Payer: Self-pay | Admitting: Family Medicine

## 2014-01-18 ENCOUNTER — Ambulatory Visit (INDEPENDENT_AMBULATORY_CARE_PROVIDER_SITE_OTHER): Payer: Managed Care, Other (non HMO) | Admitting: Family Medicine

## 2014-01-18 VITALS — BP 98/74 | HR 98 | Temp 98.5°F | Ht 61.0 in | Wt 158.0 lb

## 2014-01-18 DIAGNOSIS — G43909 Migraine, unspecified, not intractable, without status migrainosus: Secondary | ICD-10-CM

## 2014-01-18 DIAGNOSIS — F32A Depression, unspecified: Secondary | ICD-10-CM

## 2014-01-18 DIAGNOSIS — E038 Other specified hypothyroidism: Secondary | ICD-10-CM

## 2014-01-18 DIAGNOSIS — E785 Hyperlipidemia, unspecified: Secondary | ICD-10-CM

## 2014-01-18 DIAGNOSIS — F329 Major depressive disorder, single episode, unspecified: Secondary | ICD-10-CM

## 2014-01-18 MED ORDER — PAROXETINE HCL 20 MG PO TABS: 20.0000 mg | ORAL_TABLET | Freq: Every morning | ORAL | Status: DC

## 2014-01-18 MED ORDER — SYNTHROID 100 MCG PO TABS: 100.0000 ug | ORAL_TABLET | Freq: Every day | ORAL | Status: DC

## 2014-01-18 MED ORDER — ROSUVASTATIN CALCIUM 20 MG PO TABS: 20.0000 mg | ORAL_TABLET | Freq: Every day | ORAL | Status: DC

## 2014-01-18 MED ORDER — RIZATRIPTAN BENZOATE 10 MG PO TABS: 10.0000 mg | ORAL_TABLET | Freq: Every day | ORAL | Status: DC | PRN

## 2014-01-18 NOTE — Progress Notes (Signed)
   Subjective:    Patient ID: Claudia Luna, female    DOB: [birth year], 58 y.o.   MRN: [id number]  HPI Here foe med refills prior to leaving for [place] this weekend to visit her family. She plans to stay there for 2 months before coming back. She sees Dr. Artist PaisYoo for primary care but could not see him in the near future. She feels fine. I see from her chart that her TSH has not been checked fo rover a year and her lipids have not been checked for over 2 years.    Review of Systems  Constitutional: Negative.   Respiratory: Negative.   Cardiovascular: Negative.   Endocrine: Negative.   Psychiatric/Behavioral: Negative.        Objective:   Physical Exam  Constitutional: She is oriented to person, place, and time. She appears well-developed and well-nourished.  Neck: No thyromegaly present.  Cardiovascular: Normal rate, regular rhythm, normal heart sounds and intact distal pulses.   Pulmonary/Chest: Effort normal and breath sounds normal.  Lymphadenopathy:    She has no cervical adenopathy.  Neurological: She is alert and oriented to person, place, and time.  Psychiatric: She has a normal mood and affect. Her behavior is normal. Thought content normal.          Assessment & Plan:  See seems to be doing well. I did refill her meds for 3 months, but she needs to follow up with Dr. Artist PaisYoo upon her return and to get fasting labs.

## 2014-01-18 NOTE — Progress Notes (Signed)
Pre visit review using our clinic review tool, if applicable. No additional management support is needed unless otherwise documented below in the visit note. 

## 2014-04-24 ENCOUNTER — Telehealth: Payer: Self-pay | Admitting: Family Medicine

## 2014-04-24 NOTE — Telephone Encounter (Signed)
This is a patient of Dr. Artist PaisYoo

## 2014-04-24 NOTE — Telephone Encounter (Signed)
Dr Artist PaisYoo has not seen pt since 2014.  She can't have anymore refills until she is seen

## 2014-04-24 NOTE — Telephone Encounter (Signed)
Pt needs paroxetine 20 mg #30 call into [facility]. Pt will make an appt

## 2014-04-25 NOTE — Telephone Encounter (Signed)
rx sent in electronically 

## 2014-04-25 NOTE — Telephone Encounter (Signed)
Pt has been sch for 05-08-14. Pt would like a refill until she call been seen

## 2014-05-08 ENCOUNTER — Ambulatory Visit (INDEPENDENT_AMBULATORY_CARE_PROVIDER_SITE_OTHER): Payer: Managed Care, Other (non HMO) | Admitting: Internal Medicine

## 2014-05-08 ENCOUNTER — Encounter: Payer: Self-pay | Admitting: Internal Medicine

## 2014-05-08 VITALS — BP 100/60 | HR 90 | Temp 98.8°F | Wt 163.6 lb

## 2014-05-08 DIAGNOSIS — E785 Hyperlipidemia, unspecified: Secondary | ICD-10-CM | POA: Diagnosis not present

## 2014-05-08 DIAGNOSIS — G43909 Migraine, unspecified, not intractable, without status migrainosus: Secondary | ICD-10-CM

## 2014-05-08 DIAGNOSIS — R0602 Shortness of breath: Secondary | ICD-10-CM | POA: Diagnosis not present

## 2014-05-08 DIAGNOSIS — R531 Weakness: Secondary | ICD-10-CM | POA: Diagnosis not present

## 2014-05-08 DIAGNOSIS — E038 Other specified hypothyroidism: Secondary | ICD-10-CM

## 2014-05-08 DIAGNOSIS — R0600 Dyspnea, unspecified: Secondary | ICD-10-CM

## 2014-05-08 DIAGNOSIS — R0689 Other abnormalities of breathing: Secondary | ICD-10-CM

## 2014-05-08 LAB — CK: Total CK: 52 U/L (ref 7–177)

## 2014-05-08 LAB — CBC WITH DIFFERENTIAL/PLATELET
Basophils Absolute: 0.1 10*3/uL (ref 0.0–0.1)
Basophils Relative: 0.8 % (ref 0.0–3.0)
Eosinophils Absolute: 0.3 10*3/uL (ref 0.0–0.7)
Eosinophils Relative: 4.2 % (ref 0.0–5.0)
HCT: 43.1 % (ref 36.0–46.0)
Hemoglobin: 14.8 g/dL (ref 12.0–15.0)
Lymphocytes Relative: 36.7 % (ref 12.0–46.0)
Lymphs Abs: 2.6 10*3/uL (ref 0.7–4.0)
MCHC: 34.3 g/dL (ref 30.0–36.0)
MCV: 89.3 fl (ref 78.0–100.0)
Monocytes Absolute: 0.5 10*3/uL (ref 0.1–1.0)
Monocytes Relative: 7 % (ref 3.0–12.0)
Neutro Abs: 3.6 10*3/uL (ref 1.4–7.7)
Neutrophils Relative %: 51.3 % (ref 43.0–77.0)
Platelets: 285 10*3/uL (ref 150.0–400.0)
RBC: 4.83 Mil/uL (ref 3.87–5.11)
RDW: 13.6 % (ref 11.5–15.5)
WBC: 7 10*3/uL (ref 4.0–10.5)

## 2014-05-08 LAB — TSH: TSH: 5.38 u[IU]/mL — ABNORMAL HIGH (ref 0.35–4.50)

## 2014-05-08 LAB — LIPID PANEL
Cholesterol: 186 mg/dL (ref 0–200)
HDL: 52.3 mg/dL (ref >39.00)
NonHDL: 133.7
Total CHOL/HDL Ratio: 4
Triglycerides: 273 mg/dL — ABNORMAL HIGH (ref 0.0–149.0)
VLDL: 54.6 mg/dL — ABNORMAL HIGH (ref 0.0–40.0)

## 2014-05-08 LAB — BASIC METABOLIC PANEL
BUN: 17 mg/dL (ref 6–23)
CO2: 22 mEq/L (ref 19–32)
Calcium: 10 mg/dL (ref 8.4–10.5)
Chloride: 108 mEq/L (ref 96–112)
Creatinine, Ser: 0.85 mg/dL (ref 0.40–1.20)
GFR: 73.2 mL/min (ref >60.00)
Glucose, Bld: 88 mg/dL (ref 70–99)
Potassium: 3.3 mEq/L — ABNORMAL LOW (ref 3.5–5.1)
Sodium: 141 mEq/L (ref 135–145)

## 2014-05-08 LAB — HEPATIC FUNCTION PANEL
ALT: 40 U/L — ABNORMAL HIGH (ref 0–35)
AST: 33 U/L (ref 0–37)
Albumin: 4.4 g/dL (ref 3.5–5.2)
Alkaline Phosphatase: 77 U/L (ref 39–117)
Bilirubin, Direct: 0.1 mg/dL (ref 0.0–0.3)
Total Bilirubin: 0.6 mg/dL (ref 0.2–1.2)
Total Protein: 7.6 g/dL (ref 6.0–8.3)

## 2014-05-08 LAB — T4, FREE: Free T4: 0.68 ng/dL (ref 0.60–1.60)

## 2014-05-08 LAB — BRAIN NATRIURETIC PEPTIDE: Pro B Natriuretic peptide (BNP): 27 pg/mL (ref 0.0–100.0)

## 2014-05-08 LAB — SEDIMENTATION RATE: Sed Rate: 9 mm/hr (ref 0–22)

## 2014-05-08 LAB — LDL CHOLESTEROL, DIRECT: Direct LDL: 124 mg/dL

## 2014-05-08 MED ORDER — RIZATRIPTAN BENZOATE 10 MG PO TABS: 10.0000 mg | ORAL_TABLET | Freq: Every day | ORAL | Status: DC | PRN

## 2014-05-08 MED ORDER — ROSUVASTATIN CALCIUM 20 MG PO TABS
20.0000 mg | ORAL_TABLET | Freq: Every day | ORAL | Status: DC
Start: 2014-05-08 — End: 2014-10-03

## 2014-05-08 MED ORDER — PAROXETINE HCL 20 MG PO TABS: 20.0000 mg | ORAL_TABLET | Freq: Every morning | ORAL | Status: DC

## 2014-05-08 MED ORDER — PANTOPRAZOLE SODIUM 40 MG PO TBEC: DELAYED_RELEASE_TABLET | ORAL | Status: DC

## 2014-05-08 MED ORDER — LEVOTHYROXINE SODIUM 125 MCG PO TABS: 125.0000 ug | ORAL_TABLET | Freq: Every day | ORAL | Status: DC

## 2014-05-08 NOTE — Assessment & Plan Note (Signed)
Monitor lipid panel and LFTs.  Continue crestor.

## 2014-05-08 NOTE — Assessment & Plan Note (Signed)
59 year old white female with progressive dyspnea.  Previous CT of chest in 2014 showed Nonspecific patchy ground-glass opacities in both lungs with early mosaic attenuation pattern. This can be seen in small airways disease, collagen vascular disease and occasionally in chronic occlusive vascular disease (although there are no other secondary signs of the latter.  I doubt her symptoms related to pericardial effusion/pericarditis.  Obtain HRCT of Chest.  Arrange follow up with Dr. Shelle Ironlance for further workup for ILD.

## 2014-05-08 NOTE — Assessment & Plan Note (Signed)
Monitor TFTs.  Patient is overdue.  Adjust synthroid dose accordingly.

## 2014-05-08 NOTE — Progress Notes (Signed)
Pre visit review using our clinic review tool, if applicable. No additional management support is needed unless otherwise documented below in the visit note. 

## 2014-05-08 NOTE — Progress Notes (Signed)
Subjective:    Patient ID: Claudia Luna, female    DOB: 12/22/1955, 59 y.o.   MRN: [id number]  HPI  59 year old white female previously seen in 2014 for cough and shortness of breath for follow-up.  Interval medical history-patient seen by pulmonary and cardiology. Patient found to have small pericardial effusion. Patient denies any chest pain.  CT of chest showed nonspecific groundglass opacities. Patient reports shortness of breath has progressively gotten worse. She still has chronic nonproductive cough.  Her shortness of breath/dyspnea limits activities of daily living. She has difficulty doing household chores and sometimes even showering.  Hyperlipidemia - patient due for LFTs and FLP.  She reports good compliance with Crestor  Hypothyroidism-patient overdue for thyroid function studies  Review of Systems Dyspnea with minimal exertion, chronic dry cough No hx of pets at home.  No birds.  She previously had dogs but she was unable to care for them.  Question mold exposure at home.    Past Medical History  Diagnosis Date  . Thyroid disease     hypothyroid  . Anxiety   . Depression   . Asthma   . Hyperlipidemia   . Headache(784.0)   . Chronic kidney disease     kidney stones, history of  . Fibromyalgia   . Gout     History   Social History  . Marital Status: Married    Spouse Name: Merlyn Albert  . Number of Children: 4  . Years of Education: N/A   Occupational History  . Freight forwarder    Social History Main Topics  . Smoking status: Never Smoker   . Smokeless tobacco: Never Used  . Alcohol Use: 0.0 oz/week    0 Standard drinks or equivalent per week     Comment: 2-4 drinks/year  . Drug Use: No  . Sexual Activity: Yes   Other Topics Concern  . Not on file   Social History Narrative   Regular exercise: yes    Past Surgical History  Procedure Laterality Date  . Breast surgery  1993    reduction  . Abdominal hysterectomy    . Knee  arthroscopy      total knee  . Appendectomy    . Cholecystectomy    . Splenic artery embolization      Family History  Problem Relation Age of Onset  . Cancer Mother     breast  . Stroke Mother     "mini"  . Coronary artery disease Father     status post CABG  . Peripheral vascular disease Father   . Hypertension Father   . Hyperlipidemia Father   . Stroke Father     multiple  . Hypertension Sister   . High Cholesterol Sister   . GER disease Sister     Allergies  Allergen Reactions  . Codeine     REACTION: VOMITING  . Penicillins     REACTION: RASH  . Pregabalin     REACTION: swelling and weight gain    Current Outpatient Prescriptions on File Prior to Visit  Medication Sig Dispense Refill  . bisacodyl (DULCOLAX) 5 MG EC tablet Take 10 mg by mouth at bedtime. Pt takes 2 tabs every other night    . dextromethorphan (DELSYM) 30 MG/5ML liquid 2 tsp twice daily as needed for cough    . gabapentin (NEURONTIN) 300 MG capsule Take 1 capsule (300 mg total) by mouth 3 (three) times daily. 90 capsule 11  . ibuprofen (ADVIL,MOTRIN) 200 MG  tablet 4 tabs three times daily as needed for joint pain - take with food    . OXYCONTIN 15 MG T12A 12 hr tablet Take 10 mg by mouth 3 (three) times daily as needed.     . valACYclovir (VALTREX) 1000 MG tablet TAKE 1 TABLET BY MOUTH DAILY AS DIRECTED WHEN NEEDED FOR COLD SORES 30 tablet 0  . Vitamin D, Ergocalciferol, (DRISDOL) 50000 UNITS CAPS capsule     . Vitamin D, Ergocalciferol, (DRISDOL) 50000 UNITS CAPS capsule TAKE 1 CAPSULE BY MOUTH EVERY 7 DAYS 30 capsule 0  . Alum & Mag Hydroxide-Simeth (MAGIC MOUTHWASH) SOLN Take 5 mLs by mouth 4 (four) times daily as needed for mouth pain. (Patient not taking: Reported on 05/08/2014) 120 mL 0  . estradiol (MINIVELLE) 0.05 MG/24HR patch Place 1 patch onto the skin 2 (two) times a week. On Monday/Thursday    . KLOR-CON 10 10 MEQ tablet TAKE 1 TABLET BY MOUTH DAILY (Patient not taking: Reported on  01/18/2014) 30 tablet 6   No current facility-administered medications on file prior to visit.    BP 100/60 mmHg  Pulse 90  Temp(Src) 98.8 F (37.1 C) (Oral)  Wt 163 lb 9.6 oz (74.208 kg)        Objective:   Physical Exam  Constitutional: She is oriented to person, place, and time. She appears well-developed and well-nourished. No distress.  HENT:  Head: Normocephalic and atraumatic.  Right Ear: External ear normal.  Left Ear: External ear normal.  Mouth/Throat: Oropharynx is clear and moist.  Eyes: Conjunctivae and EOM are normal. Pupils are equal, round, and reactive to light.  Neck: Neck supple.  Cardiovascular: Normal rate, regular rhythm and normal heart sounds.   No murmur heard. Pulmonary/Chest: Effort normal and breath sounds normal. She has no wheezes.  Abdominal: Soft. Bowel sounds are normal. There is no tenderness.  Musculoskeletal: She exhibits no edema.  Lymphadenopathy:    She has no cervical adenopathy.  Neurological: She is alert and oriented to person, place, and time. No cranial nerve deficit.  Skin: Skin is warm and dry. No rash noted.  Psychiatric: She has a normal mood and affect. Her behavior is normal.          Assessment & Plan:

## 2014-05-09 ENCOUNTER — Telehealth: Payer: Self-pay | Admitting: Internal Medicine

## 2014-05-09 ENCOUNTER — Telehealth: Payer: Self-pay

## 2014-05-09 NOTE — Telephone Encounter (Signed)
Number below is incorrect, office # is [phone].  Spoke with Stanton KidneyDebra, states pt needs to see another provider, any besides MW for increased sob.  Scheduled pt with [facility] on 4/13.  Their office will be contacting pt to make her aware.  Nothing further needed.

## 2014-05-09 NOTE — Telephone Encounter (Signed)
Made in error

## 2014-05-10 ENCOUNTER — Other Ambulatory Visit: Payer: Self-pay | Admitting: *Deleted

## 2014-05-11 ENCOUNTER — Ambulatory Visit (INDEPENDENT_AMBULATORY_CARE_PROVIDER_SITE_OTHER)
Admission: RE | Admit: 2014-05-11 | Discharge: 2014-05-11 | Disposition: A | Discharge status: Home / Self Care | Payer: Managed Care, Other (non HMO) | Source: Ambulatory Visit | Attending: Internal Medicine | Admitting: Internal Medicine

## 2014-05-11 ENCOUNTER — Ambulatory Visit: Payer: Managed Care, Other (non HMO) | Admitting: Internal Medicine

## 2014-05-11 DIAGNOSIS — R0602 Shortness of breath: Secondary | ICD-10-CM | POA: Diagnosis not present

## 2014-05-12 ENCOUNTER — Other Ambulatory Visit: Payer: Self-pay | Admitting: Internal Medicine

## 2014-05-12 DIAGNOSIS — R0602 Shortness of breath: Secondary | ICD-10-CM

## 2014-05-17 ENCOUNTER — Ambulatory Visit (INDEPENDENT_AMBULATORY_CARE_PROVIDER_SITE_OTHER): Payer: Managed Care, Other (non HMO) | Admitting: Pulmonary Disease

## 2014-05-17 ENCOUNTER — Encounter: Payer: Self-pay | Admitting: Physician Assistant

## 2014-05-17 ENCOUNTER — Other Ambulatory Visit: Payer: Self-pay

## 2014-05-17 ENCOUNTER — Ambulatory Visit (INDEPENDENT_AMBULATORY_CARE_PROVIDER_SITE_OTHER): Payer: Managed Care, Other (non HMO) | Admitting: Physician Assistant

## 2014-05-17 ENCOUNTER — Encounter: Payer: Self-pay | Admitting: Pulmonary Disease

## 2014-05-17 VITALS — BP 110/62 | HR 76 | Ht 61.0 in | Wt 165.2 lb

## 2014-05-17 VITALS — BP 102/64 | HR 84 | Temp 98.6°F | Ht 61.0 in | Wt 165.2 lb

## 2014-05-17 DIAGNOSIS — R0789 Other chest pain: Secondary | ICD-10-CM | POA: Diagnosis not present

## 2014-05-17 DIAGNOSIS — M609 Myositis, unspecified: Secondary | ICD-10-CM

## 2014-05-17 DIAGNOSIS — M791 Myalgia: Secondary | ICD-10-CM

## 2014-05-17 DIAGNOSIS — R05 Cough: Secondary | ICD-10-CM

## 2014-05-17 DIAGNOSIS — R0600 Dyspnea, unspecified: Secondary | ICD-10-CM

## 2014-05-17 DIAGNOSIS — IMO0001 Reserved for inherently not codable concepts without codable children: Secondary | ICD-10-CM

## 2014-05-17 DIAGNOSIS — R059 Cough, unspecified: Secondary | ICD-10-CM | POA: Insufficient documentation

## 2014-05-17 DIAGNOSIS — R079 Chest pain, unspecified: Secondary | ICD-10-CM

## 2014-05-17 MED ORDER — LORAZEPAM 1 MG PO TABS: 1.0000 mg | ORAL_TABLET | Freq: Three times a day (TID) | ORAL | Status: DC

## 2014-05-17 MED ORDER — PANTOPRAZOLE SODIUM 40 MG PO TBEC: 40.0000 mg | DELAYED_RELEASE_TABLET | Freq: Every day | ORAL | Status: DC

## 2014-05-17 NOTE — Patient Instructions (Addendum)
Medication Instructions:   Your physician recommends that you continue on your current medications as directed. Please refer to the Current Medication list given to you today.   Labwork:  NONE  Testing/Procedures:  Your physician has requested that you have an echocardiogram. Echocardiography is a painless test that uses sound waves to create images of your heart. It provides your doctor with information about the size and shape of your heart and how well your heart's chambers and valves are working. This procedure takes approximately one hour. There are no restrictions for this procedure.   At this time we will call you to schedule an appointment for further testing if need be. If you have any questions or concerns please call our office at [phone].   Follow-Up:  Will follow-up as needed after testing.   Any Other Special Instructions Will Be Listed Below (If Applicable).  Echocardiogram An echocardiogram, or echocardiography, uses sound waves (ultrasound) to produce an image of your heart. The echocardiogram is simple, painless, obtained within a short period of time, and offers valuable information to your health care provider. The images from an echocardiogram can provide information such as:  Evidence of coronary artery disease (CAD).  Heart size.  Heart muscle function.  Heart valve function.  Aneurysm detection.  Evidence of a past heart attack.  Fluid buildup around the heart.  Heart muscle thickening.  Assess heart valve function. LET [facility] CenterYOUR HEALTH CARE PROVIDER KNOW ABOUT:  Any allergies you have.  All medicines you are taking, including vitamins, herbs, eye drops, creams, and over-the-counter medicines.  Previous problems you or members of your family have had with the use of anesthetics.  Any blood disorders you have.  Previous surgeries you have had.  Medical conditions you have.  Possibility of pregnancy, if this applies. BEFORE THE  PROCEDURE  No special preparation is needed. Eat and drink normally.  PROCEDURE   In order to produce an image of your heart, gel will be applied to your chest and a wand-like tool (transducer) will be moved over your chest. The gel will help transmit the sound waves from the transducer. The sound waves will harmlessly bounce off your heart to allow the heart images to be captured in real-time motion. These images will then be recorded.  You may need an IV to receive a medicine that improves the quality of the pictures. AFTER THE PROCEDURE You may return to your normal schedule including diet, activities, and medicines, unless your health care provider tells you otherwise. Document Released: 01/18/2000 Document Revised: 06/06/2013 Document Reviewed: 09/27/2012 [facility] Patient Information 2015 [place], [place]. This information is not intended to replace advice given to you by your health care provider. Make sure you discuss any questions you have with your health care provider.

## 2014-05-17 NOTE — Progress Notes (Signed)
Subjective:     Patient ID: Claudia Luna, female   DOB: [birth year], 59 y.o.   MRN: [id number]  HPI Brief patient profile:  41 yowf never smoker perfectly healthy except for dx of fibromyalgia in 1991 referred by Dr Artist Pais to pulmonary clinic 12/02/12 for refractory cough since 10/2011 with methacholine challenge 01/06/13 causing cough but only drop in fef 25-75.  12/02/2012:  1st [facility] office visit/ Wert cc acute onset September 2013  facial pain, sore throat assoc with cough yellow mucus sorta like a head cold that persisted and some better with abx and cough turned more dry but persisted ever since. Only sob when coughing. No cough to vomit. Cough to point where sore in upper abd during cough and also midline upper chest Cough is daily wakes her up and decreases some p asleep.  REC:  First take delsym two tsp every 12 hours and supplement if needed with oxy ir 5 mg  up to 2 every 4 hours to suppress the urge to cough at all or even clear your throat.  .   Pantoprazole (protonix) 40 mg- Take 30-60 min before first meal of the day and Pepcid 20 mg one bedtime until return to office    GERD Diet  Prednisone 10 mg- take 4 each am x 2 days,  2 each am x 2 days, 1 each am x 2 days and stop   Sinus CT 06/04/12>  Minimal maxillary mucoperiosteal thickening.  CT Chest 11/17/12>  1. No evidence of acute pulmonary embolism.  2. Nonspecific patchy ground-glass opacities in both lungs with early mosaic attenuation pattern  12/17/2012:  f/u ov/Wert re: cough x one year Chief Complaint  Patient presents with  . Acute Visit    Pt states that cough had improved while on prednisone, but has worsened since she stopped med 2 days ago.  Cough is non prod, but she feels congested in her chest.     sensation of globus, "not able to cough the fur ball up"  REC:  Neurontin Three times a day , cough control regimen w/ oxycodone GERD rx   12/28/12:  Follow up w/ TP> Returns for follow up and Med  calendar .  reports cough is improved since last ov, congestion is resolved. pred taper was extended on 11/20. Cough is better while she is using oxycodone. We discussed cough suppression regimen . Also discussed short term use of narcotic for cough control and dangers of narcotic dependence. She verbalizes awareness.  Discussed GERD/AR /cough prevention regiemn.  Cough is mainly dry. No discolored mucus, fever, chest pain, orthopnea or edema.  REC:  Follow med calendar closely and bring to each visit.  We are setting you up for a Methacholine Challenge Test > coughed but only dropped in fef 25-75 ? Better p albuterol  Work on cough control regimen  01/11/2013:  f/u ov/Wert re: chronic dry cough x one year Chief Complaint  Patient presents with  . 2 week follow up    Cough has improved since last OV.  still needs oxyir at least twice daily in am w/in 30 min of rising and the 3 pm, ? Better p saba but not clear  >>gabapentin 300mg  Three times a day   SHE HAD METHACHOLINE CHALLENGE 01/06/13> caused cough & drop in mid-flows noted, given trial [id number]  02/18/13:  Follow up and Med review w/ TP>  Returns for follow up and med review .  Pt brought all meds with her today.  Reviewed all meds and organized them into a med calendar with pt education.  Reports cough is 80% improved overall, though does report some increased cough x2.5 weeks; stopped the Chlor-tabs d/t ineffectiveness. stil has some post nasal drip  No chest pain, orthopnea, dyspnea or edema.   ~  May 17, 2014:  Pulm consult requested by [facility] for Dyspnea> pt c/o increasing SOB/ DOE, wheezing, and cough; she notes assoc CP & bilat neck pain; theses symptoms are present all the time, worse w/ movement, better w/ sitting or lying down; she notes occas choking & swallowing difficulty- ?if she vomits she notes improvement? She is a never smoker...      Current meds> no resp meds; she takes Oxycontin, Advil, Neurontin per pain management,  DrBodea ("I have 3 diff types of pain") and has seen DrCheyne in [place] for FM; Maxalt & prev Botox for migraines per DrHagen... EXAM reveals Afeb, VSS, O2sat=96% on RA;  HEENT- neg, mallampati2;  Chest- clear to P&A w/o w/r/r;  Cor- RR w/o m/r/g;  Ext- w/o c/c/e...  CXR (last 12/2012) showed norm heart size, clear lungs, NAD...   CT Chest 05/11/14 showed norm heart size, sm amt peric fluid, atherosclerotic changes, no adenop, clear lungs w/ min scarring (hi-res images w/o sign changes but +air trapping on exp images, s/p GB & hep steatosis  Spirometry 05/17/14 showed FVC=2.53 (91%), FEV1=2.10 (92%), %1sec=83, mid-flows were wnl at 98% predicted...  Ambulatory oxygen saturation test> O2sat=95% on RA at rest w/ pulse=83/min; she walked 3 laps w/ lowest O2sat=93% w/ pulse=126/min...  LABS 05/2014 reviewed> K=3.3, otherw wnl, BNP=27, WBC=7K (4%eos),  I reviewed all images and studies personally> note that Cardiac work-up is also in progress...  IMP/PLAN>>  Her symptoms are out of proportion to her objective data; she is on a large dose of narcotic analgesics and this will certainly also suppress cough; she notes some choking & swallowing difficulty & a thorough GI eval would be in order;  In the meanwhile it is rec that she try Lorazepam - 1/2 to 1 tab Tid & proceed w/ Cards eval (if neg then commence a regular exercise program); add PANTOPRAZOLE40/d; we reviewed cough drops, throat lozenges, sugarless gum... ROV in 44mo to assess progress...   Past Medical History  Diagnosis Date  . Thyroid disease     hypothyroid  . Anxiety   . Depression   . Asthma   . Hyperlipidemia   . Headache(784.0)   . Chronic kidney disease     kidney stones, history of  . Fibromyalgia   . Gout     Past Surgical History  Procedure Laterality Date  . Breast surgery  1993    reduction  . Abdominal hysterectomy    . Knee arthroscopy      total knee  . Appendectomy    . Cholecystectomy    . Splenic artery  embolization      Outpatient Encounter Prescriptions as of 05/17/2014  Medication Sig  . bisacodyl (DULCOLAX) 5 MG EC tablet Take 10 mg by mouth at bedtime. Pt takes 2 tabs every other night  . estradiol (MINIVELLE) 0.05 MG/24HR patch Place 1 patch onto the skin 2 (two) times a week. On Monday/Thursday  . gabapentin (NEURONTIN) 300 MG capsule Take 1 capsule (300 mg total) by mouth 3 (three) times daily.  Marland Kitchen ibuprofen (ADVIL,MOTRIN) 200 MG tablet 4 tabs three times daily as needed for joint pain - take with food  . levothyroxine (SYNTHROID) 125 MCG tablet Take 1  tablet (125 mcg total) by mouth daily before breakfast.  . Melatonin 3 MG CAPS Take 3 mg by mouth at bedtime.   . OXYCONTIN 15 MG T12A 12 hr tablet Take 10 mg by mouth 3 (three) times daily as needed.   Marland Kitchen PARoxetine (PAXIL) 20 MG tablet Take 1 tablet (20 mg total) by mouth every morning.  . rizatriptan (MAXALT) 10 MG tablet Take 1 tablet (10 mg total) by mouth daily as needed. For headache  . rosuvastatin (CRESTOR) 20 MG tablet Take 1 tablet (20 mg total) by mouth daily.  . valACYclovir (VALTREX) 1000 MG tablet TAKE 1 TABLET BY MOUTH DAILY AS DIRECTED WHEN NEEDED FOR COLD SORES  . LORazepam (ATIVAN) 1 MG tablet Take 1 tablet (1 mg total) by mouth 3 (three) times daily.  . pantoprazole (PROTONIX) 40 MG tablet Take 1 tablet (40 mg total) by mouth daily.  . [DISCONTINUED] Alum & Mag Hydroxide-Simeth (MAGIC MOUTHWASH) SOLN Take 5 mLs by mouth 4 (four) times daily as needed for mouth pain. (Patient not taking: Reported on 05/08/2014)  . [DISCONTINUED] dextromethorphan (DELSYM) 30 MG/5ML liquid 2 tsp twice daily as needed for cough  . [DISCONTINUED] KLOR-CON 10 10 MEQ tablet TAKE 1 TABLET BY MOUTH DAILY (Patient not taking: Reported on 05/17/2014)  . [DISCONTINUED] pantoprazole (PROTONIX) 40 MG tablet TAKE 1 TABLET DAILY. TAKE 30 TO 60 MINUTES BEFORE FIRST MEAL OF THE DAY. (Patient not taking: Reported on 05/17/2014)  . [DISCONTINUED] Vitamin D,  Ergocalciferol, (DRISDOL) 50000 UNITS CAPS capsule   . [DISCONTINUED] Vitamin D, Ergocalciferol, (DRISDOL) 50000 UNITS CAPS capsule TAKE 1 CAPSULE BY MOUTH EVERY 7 DAYS (Patient not taking: Reported on 05/17/2014)   No facility-administered encounter medications on file as of 05/17/2014.   Allergies  Allergen Reactions  . Codeine     REACTION: VOMITING  . Penicillins     REACTION: RASH  . Pregabalin     REACTION: swelling and weight gain    Current Medications, Allergies, Past Medical History, Past Surgical History, Family History, and Social History were reviewed in [employer] record.   Review of Systems    ROS  The following are not active complaints unless bolded sore throat, dysphagia, dental problems, itching, sneezing,  nasal congestion or excess/ purulent secretions, ear ache  fever, chills, sweats, unintended wt loss, pleuritic or exertional cp, hemoptysis, orthopnea pnd or leg swelling, presyncope, palpitations, heartburn, abdominal pain, anorexia, nausea, vomiting, diarrhea  or change in bowel or urinary habits, change in stools or urine, dysuria,hematuria,  rash, arthralgias, visual complaints, headache, numbness weakness or ataxia or problems with walking or coordination,  change in mood/affect or memory.       Objective:   Physical Exam     VS- reviewed... HEENT: nl dentition, turbinates, and orophanx. Nl external ear canals without cough reflex NECK :  without JVD/Nodes/TM/ nl carotid upstrokes bilaterally LUNGS: no acc muscle use, clear to A and P bilaterally without wheezing, rales, rhonchi CV:  RRR  no s3 or murmur or increase in P2, no edema  ABD:  soft and nontender with nl excursion in the supine position. No bruits or organomegaly, bowel sounds nl MS:  warm without deformities, calf tenderness, cyanosis or clubbing SKIN: warm and dry without lesions   NEURO:  alert, approp, no deficits    Assessment:      IMP  >> Cough Dyspnea Chronic pain FM Headaches   PLAN >>  Her symptoms are out of proportion to her objective data; she is  on a large dose of narcotic analgesics and this will certainly also suppress cough; she notes some choking & swallowing difficulty & a thorough GI eval would be in order;  In the meanwhile it is rec that she try Lorazepam 1mg - 1/2 to 1 tab Tid & proceed w/ Cards eval (if neg then commence a regular exercise program); add PANTOPRAZOLE40/d; we reviewed cough drops, throat lozenges, sugarless gum... ROV in 3mo to assess progress.     Plan:     Patient's Medications  New Prescriptions   LORAZEPAM (ATIVAN) 1 MG TABLET    Take 1 tablet (1 mg total) by mouth 3 (three) times daily.   PANTOPRAZOLE (PROTONIX) 40 MG TABLET    Take 1 tablet (40 mg total) by mouth daily.  Previous Medications   BISACODYL (DULCOLAX) 5 MG EC TABLET    Take 10 mg by mouth at bedtime. Pt takes 2 tabs every other night   ESTRADIOL (MINIVELLE) 0.05 MG/24HR PATCH    Place 1 patch onto the skin 2 (two) times a week. On Monday/Thursday   GABAPENTIN (NEURONTIN) 300 MG CAPSULE    Take 1 capsule (300 mg total) by mouth 3 (three) times daily.   IBUPROFEN (ADVIL,MOTRIN) 200 MG TABLET    4 tabs three times daily as needed for joint pain - take with food   LEVOTHYROXINE (SYNTHROID) 125 MCG TABLET    Take 1 tablet (125 mcg total) by mouth daily before breakfast.   MELATONIN 3 MG CAPS    Take 3 mg by mouth at bedtime.    OXYCONTIN 15 MG T12A 12 HR TABLET    Take 10 mg by mouth 3 (three) times daily as needed.    PAROXETINE (PAXIL) 20 MG TABLET    Take 1 tablet (20 mg total) by mouth every morning.   RIZATRIPTAN (MAXALT) 10 MG TABLET    Take 1 tablet (10 mg total) by mouth daily as needed. For headache   ROSUVASTATIN (CRESTOR) 20 MG TABLET    Take 1 tablet (20 mg total) by mouth daily.   VALACYCLOVIR (VALTREX) 1000 MG TABLET    TAKE 1 TABLET BY MOUTH DAILY AS DIRECTED WHEN NEEDED FOR COLD SORES  Modified Medications    Modified Medication Previous Medication   PAROXETINE (PAXIL) 20 MG TABLET PARoxetine (PAXIL) 20 MG tablet      TAKE 1 TABLET BY MOUTH EVERY MORNING    TAKE 1 TABLET BY MOUTH EVERY MORNING  Discontinued Medications   ALUM & MAG HYDROXIDE-SIMETH (MAGIC MOUTHWASH) SOLN    Take 5 mLs by mouth 4 (four) times daily as needed for mouth pain.   DEXTROMETHORPHAN (DELSYM) 30 MG/5ML LIQUID    2 tsp twice daily as needed for cough   KLOR-CON 10 10 MEQ TABLET    TAKE 1 TABLET BY MOUTH DAILY   PANTOPRAZOLE (PROTONIX) 40 MG TABLET    TAKE 1 TABLET DAILY. TAKE 30 TO 60 MINUTES BEFORE FIRST MEAL OF THE DAY.   VITAMIN D, ERGOCALCIFEROL, (DRISDOL) 50000 UNITS CAPS CAPSULE       VITAMIN D, ERGOCALCIFEROL, (DRISDOL) 50000 UNITS CAPS CAPSULE    TAKE 1 CAPSULE BY MOUTH EVERY 7 DAYS

## 2014-05-17 NOTE — Patient Instructions (Signed)
Laci, it was nice meeting you today...    I am hopeful that we can help w/ your cough and shortness of breath...   Today we checked your Spirometry & Oxygen saturation test...  To help you with getting the air "IN">>    Concentrate on good deep breaths...    Try relaxing the chest wall muscles in a hot shower or tub-bath...    Start the new LORAZEPAM 1mg  tabs- take one tab 3 times daily...    If you could find a good counselor, then this might help as well...   For the cough>>    Continue the cough drops, throat lozenges etc...    Start a vigorous ANTI-REFLUX regimen>>       Take PROTONIX 40mg  about 30 min before the evening meal...       Do not eat or drink much after dinner in the eve...       Elevate the head of your bed on 6" blocks...       Ok to [facility] the [facility] about 1h before bedtime w/ a sip of water...  Call for any questions...  Let's plan a follow up visit in 102mo, sooner if needed for problems.Marland Kitchen..Marland Kitchen

## 2014-05-17 NOTE — Progress Notes (Signed)
Cardiology Office Note   Date:  05/17/2014   ID:  Claudia Luna, DOB 03-Jan-1956, MRN [id number]  PCP:  Thomos Lemonsobert Yoo, DO  Cardiologist:  Dr Eugenio Hoesoss  Claudia Oppedisano, PA-C   SOB, hx pericardial effusion, Chest pain  History of Present Illness: Claudia Luna is a 59 y.o. female with a history of pericardial effusion, SOB with exertion and cough. She also has a history of hypothyroidism.   She had costochondritis in 2014 and that resolved w/ steroids and colchicine. She has had that twice since then.  She began having episodes of chest pain 6-7 months ago. They will start at rest or with exertion. She takes chronic ibuprofen and narcotics for fibromyalgia, and so does not take anything for these. They reach 5-6/10, last < 1 minute, worse with deep inspiration. They make her feel SOB, but no N&V or diaphoresis. She has been getting 1-2 episodes per week.   Today she saw Dr Kriste BasqueNadel, and he was concerned about muscular spasms causing her cough, and added Ativan and Protonix to her meds (not started yet).   She had a chest CT that showed coronary athersclerosis and wonders about this.    Past Medical History  Diagnosis Date  . Thyroid disease     hypothyroid  . Anxiety   . Depression   . Asthma   . Hyperlipidemia   . Headache(784.0)   . Chronic kidney disease     kidney stones, history of  . Fibromyalgia   . Gout     Past Surgical History  Procedure Laterality Date  . Breast surgery  1993    reduction  . Abdominal hysterectomy    . Knee arthroscopy      total knee  . Appendectomy    . Cholecystectomy    . Splenic artery embolization      Current Outpatient Prescriptions  Medication Sig Dispense Refill  . bisacodyl (DULCOLAX) 5 MG EC tablet Take 10 mg by mouth at bedtime. Pt takes 2 tabs every other night    . estradiol (MINIVELLE) 0.05 MG/24HR patch Place 1 patch onto the skin 2 (two) times a week. On Monday/Thursday    . gabapentin (NEURONTIN) 300 MG capsule  Take 1 capsule (300 mg total) by mouth 3 (three) times daily. 90 capsule 11  . ibuprofen (ADVIL,MOTRIN) 200 MG tablet 4 tabs three times daily as needed for joint pain - take with food    . levothyroxine (SYNTHROID) 125 MCG tablet Take 1 tablet (125 mcg total) by mouth daily before breakfast. 30 tablet 2  . LORazepam (ATIVAN) 1 MG tablet Take 1 tablet (1 mg total) by mouth 3 (three) times daily. 90 tablet 0  . Melatonin 3 MG CAPS Take 3 mg by mouth at bedtime.     . OXYCONTIN 15 MG T12A 12 hr tablet Take 10 mg by mouth 3 (three) times daily as needed.     . pantoprazole (PROTONIX) 40 MG tablet Take 1 tablet (40 mg total) by mouth daily. 90 tablet 3  . PARoxetine (PAXIL) 20 MG tablet Take 1 tablet (20 mg total) by mouth every morning. 90 tablet 1  . rizatriptan (MAXALT) 10 MG tablet Take 1 tablet (10 mg total) by mouth daily as needed. For headache 10 tablet 2  . rosuvastatin (CRESTOR) 20 MG tablet Take 1 tablet (20 mg total) by mouth daily. 90 tablet 1  . valACYclovir (VALTREX) 1000 MG tablet TAKE 1 TABLET BY MOUTH DAILY AS DIRECTED WHEN NEEDED FOR  COLD SORES 30 tablet 0   No current facility-administered medications for this visit.    Allergies:   Codeine; Penicillins; and Pregabalin    Social History:  The patient  reports that she has never smoked. She has never used smokeless tobacco. She reports that she drinks alcohol. She reports that she does not use illicit drugs.   Family History:  The patient's family history includes Breast cancer in her mother; Coronary artery disease in her father; GER disease in her sister; High Cholesterol in her sister; Hyperlipidemia in her father; Hypertension in her father and sister; Peripheral vascular disease in her father; Stroke in her father and mother.    ROS:  Please see the history of present illness. All other systems are reviewed and negative.    PHYSICAL EXAM: VS:  BP 110/62 mmHg  Pulse 76  Ht  (1.549 m)  Wt 165 lb 3.2 oz (74.934 kg)   BMI 31.23 kg/m2 , BMI Body mass index is 31.23 kg/(m^2). GEN: Well nourished, well developed, in no acute distress HEENT: normal Neck: no JVD, carotid bruits, or masses Cardiac: RRR; no murmurs, rubs, or gallops,no edema  Respiratory:  clear to auscultation bilaterally, normal work of breathing GI: soft, nontender, nondistended, + BS MS: no deformity or atrophy Skin: warm and dry, no rash Neuro:  Strength and sensation are intact Psych: euthymic mood, full affect   EKG:  EKG is ordered today. The ekg ordered today demonstrates SR, diffuse T wave flattening, QT/QTc is longer than previous ECG 2014   Recent Labs: 05/08/2014: ALT 40*; BUN 17; Creatinine 0.85; Hemoglobin 14.8; Platelets 285.0; Potassium 3.3*; Pro B Natriuretic peptide (BNP) 27.0; Sodium 141; TSH 5.38*   Lipid Panel    Component Value Date/Time   CHOL 186 05/08/2014 1345   TRIG 273.0* 05/08/2014 1345   HDL 52.30 05/08/2014 1345   CHOLHDL 4 05/08/2014 1345   VLDL 54.6* 05/08/2014 1345   LDLCALC 104* 08/04/2011 0932   LDLDIRECT 124.0 05/08/2014 1345     Wt Readings from Last 3 Encounters:  05/17/14 165 lb 3.2 oz (74.934 kg)  05/17/14 165 lb 3.2 oz (74.934 kg)  05/08/14 163 lb 9.6 oz (74.208 kg)   Ct Chest High Resolution 05/11/2014   CLINICAL DATA:  59 year old female with intermittent shortness of breath for 1 year. Intermittent chest pain off and on during the same time period.  EXAM: CT CHEST WITHOUT CONTRAST  TECHNIQUE: Multidetector CT imaging of the chest was performed following the standard protocol without intravenous contrast. High resolution imaging of the lungs, as well as inspiratory and expiratory imaging, was performed.  COMPARISON:  Chest CT 09/17/2012.  FINDINGS: Mediastinum/Lymph Nodes: Heart size is normal. Small amount of pericardial fluid and/or thickening anteriorly, unlikely to be of any hemodynamic significance at this time. No associated pericardial calcification. There is atherosclerosis of the  thoracic aorta, the great vessels of the mediastinum and the coronary arteries, including calcified atherosclerotic plaque in the distal left main and left anterior descending coronary arteries. No pathologically enlarged mediastinal or hilar lymph nodes. Please note that accurate exclusion of hilar adenopathy is limited on noncontrast CT scans. Esophagus is unremarkable in appearance. No axillary lymphadenopathy.  Lungs/Pleura: No acute consolidative airspace disease. No pleural effusions. Linear scarring in the inferior segment of the lingula, medial aspect of the right upper lobe and in the central right middle lobe. No suspicious appearing pulmonary nodules or masses. High-resolution images demonstrate no significant regions of ground-glass attenuation, subpleural reticulation, traction bronchiectasis or  frank honeycombing to suggest interstitial lung disease at this time. Inspiratory and expiratory imaging demonstrates extensive air trapping, indicative of small airways disease.  Upper Abdomen: Heterogeneous low attenuation throughout the hepatic parenchyma, compatible with hepatic steatosis. Status post cholecystectomy. Surgical clips near the splenic hilum.  Musculoskeletal/Soft Tissues: There are no aggressive appearing lytic or blastic lesions noted in the visualized portions of the skeleton.  IMPRESSION: 1. No findings to suggest interstitial lung disease. 2. However, there is significant air trapping indicative of small airways disease. 3. Atherosclerosis, including left main and left anterior descending coronary artery disease. Please note that although the presence of coronary artery calcium documents the presence of coronary artery disease, the severity of this disease and any potential stenosis cannot be assessed on this non-gated CT examination. Assessment for potential risk factor modification, dietary therapy or pharmacologic therapy may be warranted, if clinically indicated. 4. Small amount of  pericardial fluid and/or thickening anteriorly, unlikely to be of hemodynamic significance at this time. No pericardial calcification. 5. Hepatic steatosis.   Electronically Signed   By: Trudie Reed M.D.   On: 05/11/2014 16:23    Other studies Reviewed: Additional studies/ records that were reviewed today include: Previous ECG and CT chest  ASSESSMENT AND PLAN:  1.  Chest pain: atypical but significant CRFs and strong FH of premature CAD. Will need ischemic eval, Lexiscan MV as do not feel she would tolerate being on a treadmill.   2. SOB: PFTs were OK, will schedule echo to make sure PAS is OK.  3. Prolonged QT/QTc: new since 2014, asymptomatic, no obvious medication causes of this, may be due to hypokalemia  4. Hypokalemia: pt told to increase intake of K+ rich foods. Will recheck at next office visit.   Current medicines are reviewed at length with the patient today.  The patient does not have concerns regarding medicines.  The following changes have been made:  no change  Labs/ tests ordered today include:   Orders Placed This Encounter  Procedures  . EKG 12-Lead  . 2D Echocardiogram with contrast     Disposition:   FU with Dr Tenny Craw in 3 months   Signed, Theodore Demark, PA-C  05/17/2014 12:25 PM    [facility] [address] [place], [place], [place]  [address] Phone: [phone]; Fax: [phone]

## 2014-05-23 ENCOUNTER — Other Ambulatory Visit: Payer: Self-pay | Admitting: Internal Medicine

## 2014-05-25 ENCOUNTER — Ambulatory Visit (HOSPITAL_COMMUNITY): Payer: Managed Care, Other (non HMO) | Attending: Physician Assistant | Admitting: Radiology

## 2014-05-25 DIAGNOSIS — R0600 Dyspnea, unspecified: Secondary | ICD-10-CM

## 2014-05-25 DIAGNOSIS — R0789 Other chest pain: Secondary | ICD-10-CM

## 2014-05-25 DIAGNOSIS — R079 Chest pain, unspecified: Secondary | ICD-10-CM | POA: Insufficient documentation

## 2014-05-25 NOTE — Progress Notes (Signed)
Echocardiogram Complete.  Tammie Crouch, RCS 

## 2014-05-30 ENCOUNTER — Ambulatory Visit (HOSPITAL_COMMUNITY): Payer: Managed Care, Other (non HMO) | Attending: Internal Medicine | Admitting: Radiology

## 2014-05-30 DIAGNOSIS — R002 Palpitations: Secondary | ICD-10-CM | POA: Insufficient documentation

## 2014-05-30 DIAGNOSIS — R0609 Other forms of dyspnea: Secondary | ICD-10-CM | POA: Diagnosis present

## 2014-05-30 DIAGNOSIS — R0600 Dyspnea, unspecified: Secondary | ICD-10-CM

## 2014-05-30 DIAGNOSIS — R079 Chest pain, unspecified: Secondary | ICD-10-CM

## 2014-05-30 MED ORDER — TECHNETIUM TC 99M SESTAMIBI GENERIC - CARDIOLITE
11.0000 | Freq: Once | INTRAVENOUS | Status: AC | PRN
  Administered 2014-05-30: 11 via INTRAVENOUS

## 2014-05-30 MED ORDER — REGADENOSON 0.4 MG/5ML IV SOLN
0.4000 mg | Freq: Once | INTRAVENOUS | Status: AC
  Administered 2014-05-30: 0.4 mg via INTRAVENOUS

## 2014-05-30 MED ORDER — TECHNETIUM TC 99M SESTAMIBI GENERIC - CARDIOLITE
33.0000 | Freq: Once | INTRAVENOUS | Status: AC | PRN
  Administered 2014-05-30: 33 via INTRAVENOUS

## 2014-05-30 NOTE — Progress Notes (Signed)
  [facility] SITE [facility] NUCLEAR MED [address] [place], [place] [address] [phone]    Cardiology Nuclear Med Study  Claudia Leitha BleakHerring Joseph ArtWoods is a 59 y.o. female     MRN : [id number]     DOB: 03-31-1955  Procedure Date: 05/30/2014  Nuclear Med Background Indication for Stress Test:  Evaluation for Ischemia History:  No known CAD Cardiac Risk Factors: CT scan:Coronary arthersclerosis  Symptoms:  Chest Pain, DOE and Palpitations   Nuclear Pre-Procedure Caffeine/Decaff Intake:  None NPO After: 9:30pm   Lungs:  clear O2 Sat: 94% on room air. IV 0.9% NS with Angio Cath:  22g  IV Site: R Hand  IV Started by:  Cathlyn Parsonsynthia Izek Corvino, RN  Chest Size (in):  38 Cup Size: B  Height: 5\' 1"  (1.549 m)  Weight:  158 lb (71.668 kg)  BMI:  Body mass index is 29.87 kg/(m^2). Tech Comments:  n/a    Nuclear Med Study 1 or 2 day study: 1 day  Stress Test Type:  Lexiscan  Reading MD: n/a  Order Authorizing Provider:  Gunnar FusiPaula Ross,MD and Otilio Saberhonda Barrett,PAC  Resting Radionuclide: Technetium 3731m Sestamibi  Resting Radionuclide Dose: 11.0 mCi   Stress Radionuclide:  Technetium 4931m Sestamibi  Stress Radionuclide Dose: 33.0 mCi           Stress Protocol Rest HR: 82 Stress HR: 136  Rest BP: 124/71 Stress BP: 118/71  Exercise Time (min): n/a METS: n/a   Predicted Max HR: 163 bpm % Max HR: 83.44 bpm Rate Pressure Product: [id number]   Dose of Adenosine (mg):  n/a Dose of Lexiscan: 0.4 mg  Dose of Atropine (mg): n/a Dose of Dobutamine: n/a mcg/kg/min (at max HR)  Stress Test Technologist: Nelson ChimesSharon Brooks, BS-ES  Nuclear Technologist:  Kerby NoraElzbieta Kubak, CNMT     Rest Procedure:  Myocardial perfusion imaging was performed at rest 45 minutes following the intravenous administration of Technetium 6031m Sestamibi. Rest ECG: NSR with non-specific ST-T wave changes and SR, negative T waves in the anterior leads  Stress Procedure:  The patient received IV Lexiscan 0.4 mg over 15-seconds.  Technetium 6231m  Sestamibi injected at 30-seconds.  Quantitative spect images were obtained after a 45 minute delay.  During the infusion of Lexiscan the patient complained of SOB, chest tightness, feeling flushed, nausea and a headache.  Symptoms slowly began to resolve in recovery.  Stress ECG: No significant change from baseline ECG  QPS Raw Data Images:  Normal; no motion artifact; normal heart/lung ratio. Stress Images:  Normal homogeneous uptake in all areas of the myocardium. Rest Images:  Normal homogeneous uptake in all areas of the myocardium. Subtraction (SDS):  No evidence of ischemia. Transient Ischemic Dilatation (Normal <1.22):  1.09 Lung/Heart Ratio (Normal <0.45):  0.41  Quantitative Gated Spect Images QGS EDV:  66 ml QGS ESV:  27 ml  Impression Exercise Capacity:  Lexiscan with no exercise. BP Response:  Normal blood pressure response. Clinical Symptoms:  Mild chest pain/dyspnea. ECG Impression:  No significant ST segment change suggestive of ischemia. Comparison with Prior Nuclear Study: No previous nuclear study performed  Overall Impression:  Normal stress nuclear study.  LV Ejection Fraction: 59%.  LV Wall Motion:  NL LV Function; NL Wall Motion

## 2014-06-16 ENCOUNTER — Ambulatory Visit: Payer: Managed Care, Other (non HMO) | Admitting: Pulmonary Disease

## 2014-06-19 ENCOUNTER — Ambulatory Visit: Payer: Managed Care, Other (non HMO) | Admitting: Internal Medicine

## 2014-06-21 ENCOUNTER — Ambulatory Visit: Payer: Managed Care, Other (non HMO) | Admitting: Internal Medicine

## 2014-07-28 ENCOUNTER — Ambulatory Visit: Payer: Managed Care, Other (non HMO) | Admitting: Internal Medicine

## 2014-08-18 ENCOUNTER — Ambulatory Visit: Payer: Managed Care, Other (non HMO) | Admitting: Internal Medicine

## 2014-08-23 ENCOUNTER — Ambulatory Visit: Payer: Managed Care, Other (non HMO) | Admitting: Internal Medicine

## 2014-08-28 ENCOUNTER — Ambulatory Visit: Payer: Self-pay | Admitting: Podiatry

## 2014-09-05 ENCOUNTER — Other Ambulatory Visit: Payer: Self-pay | Admitting: Internal Medicine

## 2014-09-17 ENCOUNTER — Ambulatory Visit (HOSPITAL_BASED_OUTPATIENT_CLINIC_OR_DEPARTMENT_OTHER): Payer: Managed Care, Other (non HMO)

## 2014-09-22 ENCOUNTER — Ambulatory Visit: Payer: Self-pay | Admitting: Podiatry

## 2014-10-03 ENCOUNTER — Ambulatory Visit (INDEPENDENT_AMBULATORY_CARE_PROVIDER_SITE_OTHER): Payer: Managed Care, Other (non HMO) | Admitting: Family Medicine

## 2014-10-03 ENCOUNTER — Encounter: Payer: Self-pay | Admitting: Family Medicine

## 2014-10-03 VITALS — BP 110/70 | HR 100 | Temp 98.3°F | Wt 167.0 lb

## 2014-10-03 DIAGNOSIS — E876 Hypokalemia: Secondary | ICD-10-CM

## 2014-10-03 DIAGNOSIS — E785 Hyperlipidemia, unspecified: Secondary | ICD-10-CM

## 2014-10-03 DIAGNOSIS — F411 Generalized anxiety disorder: Secondary | ICD-10-CM

## 2014-10-03 DIAGNOSIS — E039 Hypothyroidism, unspecified: Secondary | ICD-10-CM | POA: Diagnosis not present

## 2014-10-03 DIAGNOSIS — G43909 Migraine, unspecified, not intractable, without status migrainosus: Secondary | ICD-10-CM

## 2014-10-03 LAB — BASIC METABOLIC PANEL
BUN: 23 mg/dL (ref 6–23)
CO2: 29 mEq/L (ref 19–32)
Calcium: 10.2 mg/dL (ref 8.4–10.5)
Chloride: 105 mEq/L (ref 96–112)
Creatinine, Ser: 1 mg/dL (ref 0.40–1.20)
GFR: 60.59 mL/min (ref >60.00)
Glucose, Bld: 95 mg/dL (ref 70–99)
Potassium: 4.4 mEq/L (ref 3.5–5.1)
Sodium: 142 mEq/L (ref 135–145)

## 2014-10-03 LAB — TSH: TSH: 9.87 u[IU]/mL — ABNORMAL HIGH (ref 0.35–4.50)

## 2014-10-03 MED ORDER — PAROXETINE HCL 20 MG PO TABS: 20.0000 mg | ORAL_TABLET | Freq: Every morning | ORAL | Status: DC

## 2014-10-03 MED ORDER — RIZATRIPTAN BENZOATE 10 MG PO TABS: 10.0000 mg | ORAL_TABLET | Freq: Every day | ORAL | Status: DC | PRN

## 2014-10-03 MED ORDER — VITAMIN D (ERGOCALCIFEROL) 1.25 MG (50000 UNIT) PO CAPS: 50000.0000 [IU] | ORAL_CAPSULE | ORAL | Status: DC

## 2014-10-03 MED ORDER — LEVOTHYROXINE SODIUM 125 MCG PO TABS: 125.0000 ug | ORAL_TABLET | Freq: Every day | ORAL | Status: DC

## 2014-10-03 MED ORDER — ESTRADIOL 0.05 MG/24HR TD PTTW: 1.0000 | MEDICATED_PATCH | TRANSDERMAL | Status: DC

## 2014-10-03 MED ORDER — ROSUVASTATIN CALCIUM 20 MG PO TABS: 20.0000 mg | ORAL_TABLET | Freq: Every day | ORAL | Status: DC

## 2014-10-03 NOTE — Progress Notes (Signed)
Pre visit review using our clinic review tool, if applicable. No additional management support is needed unless otherwise documented below in the visit note. 

## 2014-10-03 NOTE — Progress Notes (Signed)
Subjective:    Patient ID: Claudia Luna, female    DOB: January 25, 1956, 59 y.o.   MRN: [id number]  HPI Patient seen for routine medical follow-up in the absence of her primary provider who is out on medical leave. She has multiple chronic problems including hypothyroidism, hyperlipidemia, chronic insomnia, recurrent depression, fibromyalgia, chronic anxiety. She is here requesting several medications be refilled.  Her major complaint is excessive fatigue. She does have some daytime somnolence. She is followed by pain management and apparently has sleep study scheduled for October. History of hypothyroidism. Recent TSH elevated 5.38 and adjustment was made by her primary provider in her Synthroid dose. Needs follow-up. She had recent potassium slightly low 3.3. Does not take any diarrhetic's. No recent diarrhea issues.  Hyperlipidemia treated with Crestor. She has depression which has been stable for several years on Paxil. She is on Estraderm replacement with estradiol patch and requesting refills. Recent mammogram reportedly normal.  Denies any chest pains. No dyspnea. Chronic sleep difficulties for years. She is concerned whether she may have some type of endocrine disorder other than hypothyroidism explaining her fatigue.  Past Medical History  Diagnosis Date  . Thyroid disease     hypothyroid  . Anxiety   . Depression   . Asthma   . Hyperlipidemia   . Headache(784.0)   . Chronic kidney disease     kidney stones, history of  . Fibromyalgia   . Gout    Past Surgical History  Procedure Laterality Date  . Breast surgery  1993    reduction  . Abdominal hysterectomy    . Knee arthroscopy      total knee  . Appendectomy    . Cholecystectomy    . Splenic artery embolization      reports that she has never smoked. She has never used smokeless tobacco. She reports that she drinks alcohol. She reports that she does not use illicit drugs. family history includes Breast cancer in  her mother; Coronary artery disease in her father; GER disease in her sister; High Cholesterol in her sister; Hyperlipidemia in her father; Hypertension in her father and sister; Peripheral vascular disease in her father; Stroke in her father and mother. Allergies  Allergen Reactions  . Codeine     REACTION: VOMITING  . Penicillins     REACTION: RASH  . Pregabalin     REACTION: swelling and weight gain      Review of Systems  Constitutional: Positive for fatigue.  Eyes: Negative for visual disturbance.  Respiratory: Negative for cough, chest tightness, shortness of breath and wheezing.   Cardiovascular: Negative for chest pain, palpitations and leg swelling.  Gastrointestinal: Negative for abdominal pain.  Endocrine: Negative for cold intolerance.  Genitourinary: Negative for dysuria.  Musculoskeletal: Positive for myalgias. Negative for back pain and joint swelling.  Neurological: Negative for dizziness, seizures, syncope, weakness, light-headedness and headaches.  Hematological: Negative for adenopathy.  Psychiatric/Behavioral: Positive for sleep disturbance.       Objective:   Physical Exam  Constitutional: She is oriented to person, place, and time. She appears well-developed and well-nourished. No distress.  HENT:  Mouth/Throat: Oropharynx is clear and moist.  Neck: Neck supple. No thyromegaly present.  Cardiovascular: Normal rate and regular rhythm.  Exam reveals no gallop.   No murmur heard. Pulmonary/Chest: Effort normal and breath sounds normal. No respiratory distress. She has no wheezes. She has no rales.  Musculoskeletal: She exhibits no edema.  Lymphadenopathy:    She has no cervical  adenopathy.  Neurological: She is alert and oriented to person, place, and time. No cranial nerve deficit.  Skin: No rash noted.  Psychiatric: She has a normal mood and affect. Her behavior is normal.          Assessment & Plan:  #1 fibromyalgia with chronic pain followed by  pain management #2 hypothyroidism with recent elevated TSH. Recheck TSH today and adjust accordingly #3 hypokalemia. Recent potassium 3.3. ?etiology.  She does not take any diuretics.  Recheck basic metabolic panel #4 chronic fatigue. Question related to #1. Sleep study pending. Reassessing thyroid function as above.  Pt specifically requesting "ACTH".  I explained we may wish to consider referral to endocrinologist- especially if her thyroid is difficult to regulate. #5 hyperlipidemia. Refill Crestor for one year. Recent lipids reviewed and stable #6 History of recurrent depression. Stable. Refill Paxil for one year #7 migraine headaches. Refill Maxalt for as needed use

## 2014-10-04 ENCOUNTER — Other Ambulatory Visit: Payer: Self-pay | Admitting: Family Medicine

## 2014-10-04 DIAGNOSIS — E039 Hypothyroidism, unspecified: Secondary | ICD-10-CM

## 2014-11-10 ENCOUNTER — Ambulatory Visit: Payer: Managed Care, Other (non HMO) | Admitting: Internal Medicine

## 2014-11-29 ENCOUNTER — Ambulatory Visit (HOSPITAL_BASED_OUTPATIENT_CLINIC_OR_DEPARTMENT_OTHER): Payer: Managed Care, Other (non HMO)

## 2014-11-30 ENCOUNTER — Ambulatory Visit: Payer: Managed Care, Other (non HMO) | Admitting: Internal Medicine

## 2014-12-05 ENCOUNTER — Ambulatory Visit (HOSPITAL_BASED_OUTPATIENT_CLINIC_OR_DEPARTMENT_OTHER): Payer: Managed Care, Other (non HMO)

## 2014-12-22 ENCOUNTER — Ambulatory Visit: Payer: Managed Care, Other (non HMO) | Admitting: Internal Medicine

## 2015-01-15 ENCOUNTER — Emergency Department (HOSPITAL_COMMUNITY)
Admission: EM | Admit: 2015-01-15 | Discharge: 2015-01-15 | Disposition: A | Discharge status: Home / Self Care | Payer: Managed Care, Other (non HMO) | Attending: Emergency Medicine | Admitting: Emergency Medicine

## 2015-01-15 ENCOUNTER — Ambulatory Visit (INDEPENDENT_AMBULATORY_CARE_PROVIDER_SITE_OTHER): Payer: Managed Care, Other (non HMO) | Admitting: Adult Health

## 2015-01-15 ENCOUNTER — Emergency Department (HOSPITAL_COMMUNITY): Payer: Managed Care, Other (non HMO)

## 2015-01-15 ENCOUNTER — Encounter (HOSPITAL_COMMUNITY): Payer: Self-pay | Admitting: Emergency Medicine

## 2015-01-15 ENCOUNTER — Encounter: Payer: Self-pay | Admitting: Adult Health

## 2015-01-15 VITALS — BP 100/70 | HR 125 | Temp 98.0°F | Ht 61.0 in | Wt 161.0 lb

## 2015-01-15 DIAGNOSIS — R000 Tachycardia, unspecified: Secondary | ICD-10-CM | POA: Diagnosis not present

## 2015-01-15 DIAGNOSIS — R531 Weakness: Secondary | ICD-10-CM | POA: Diagnosis not present

## 2015-01-15 DIAGNOSIS — Z79899 Other long term (current) drug therapy: Secondary | ICD-10-CM | POA: Insufficient documentation

## 2015-01-15 DIAGNOSIS — J069 Acute upper respiratory infection, unspecified: Secondary | ICD-10-CM | POA: Diagnosis not present

## 2015-01-15 DIAGNOSIS — R0600 Dyspnea, unspecified: Secondary | ICD-10-CM | POA: Insufficient documentation

## 2015-01-15 DIAGNOSIS — R0602 Shortness of breath: Secondary | ICD-10-CM

## 2015-01-15 DIAGNOSIS — M109 Gout, unspecified: Secondary | ICD-10-CM | POA: Diagnosis not present

## 2015-01-15 DIAGNOSIS — M797 Fibromyalgia: Secondary | ICD-10-CM | POA: Diagnosis not present

## 2015-01-15 DIAGNOSIS — E785 Hyperlipidemia, unspecified: Secondary | ICD-10-CM | POA: Insufficient documentation

## 2015-01-15 DIAGNOSIS — N189 Chronic kidney disease, unspecified: Secondary | ICD-10-CM | POA: Insufficient documentation

## 2015-01-15 DIAGNOSIS — Z880 Allergy status to penicillin: Secondary | ICD-10-CM | POA: Diagnosis not present

## 2015-01-15 DIAGNOSIS — F329 Major depressive disorder, single episode, unspecified: Secondary | ICD-10-CM | POA: Insufficient documentation

## 2015-01-15 DIAGNOSIS — E039 Hypothyroidism, unspecified: Secondary | ICD-10-CM | POA: Diagnosis not present

## 2015-01-15 DIAGNOSIS — J45901 Unspecified asthma with (acute) exacerbation: Secondary | ICD-10-CM | POA: Insufficient documentation

## 2015-01-15 DIAGNOSIS — F419 Anxiety disorder, unspecified: Secondary | ICD-10-CM | POA: Insufficient documentation

## 2015-01-15 DIAGNOSIS — R05 Cough: Secondary | ICD-10-CM | POA: Diagnosis not present

## 2015-01-15 LAB — COMPREHENSIVE METABOLIC PANEL
ALT: 29 U/L (ref 14–54)
AST: 25 U/L (ref 15–41)
Albumin: 4.6 g/dL (ref 3.5–5.0)
Alkaline Phosphatase: 77 U/L (ref 38–126)
Anion gap: 10 (ref 5–15)
BUN: 22 mg/dL — ABNORMAL HIGH (ref 6–20)
CO2: 23 mmol/L (ref 22–32)
Calcium: 9.9 mg/dL (ref 8.9–10.3)
Chloride: 107 mmol/L (ref 101–111)
Creatinine, Ser: 0.73 mg/dL (ref 0.44–1.00)
GFR calc Af Amer: >60 mL/min (ref >60)
GFR calc non Af Amer: >60 mL/min (ref >60)
Glucose, Bld: 105 mg/dL — ABNORMAL HIGH (ref 65–99)
Potassium: 3.5 mmol/L (ref 3.5–5.1)
Sodium: 140 mmol/L (ref 135–145)
Total Bilirubin: 0.5 mg/dL (ref 0.3–1.2)
Total Protein: 7.8 g/dL (ref 6.5–8.1)

## 2015-01-15 LAB — CBC WITH DIFFERENTIAL/PLATELET
Basophils Absolute: 0.1 10*3/uL (ref 0.0–0.1)
Basophils Relative: 1 %
Eosinophils Absolute: 0.1 10*3/uL (ref 0.0–0.7)
Eosinophils Relative: 1 %
HCT: 44.8 % (ref 36.0–46.0)
Hemoglobin: 15.2 g/dL — ABNORMAL HIGH (ref 12.0–15.0)
Lymphocytes Relative: 28 %
Lymphs Abs: 2.6 10*3/uL (ref 0.7–4.0)
MCH: 30.7 pg (ref 26.0–34.0)
MCHC: 33.9 g/dL (ref 30.0–36.0)
MCV: 90.5 fL (ref 78.0–100.0)
Monocytes Absolute: 0.7 10*3/uL (ref 0.1–1.0)
Monocytes Relative: 7 %
Neutro Abs: 6 10*3/uL (ref 1.7–7.7)
Neutrophils Relative %: 63 %
Platelets: 320 10*3/uL (ref 150–400)
RBC: 4.95 MIL/uL (ref 3.87–5.11)
RDW: 13.6 % (ref 11.5–15.5)
WBC: 9.5 10*3/uL (ref 4.0–10.5)

## 2015-01-15 LAB — TSH: TSH: 0.505 u[IU]/mL (ref 0.350–4.500)

## 2015-01-15 LAB — TROPONIN I: Troponin I: <0.03 ng/mL (ref <0.031)

## 2015-01-15 MED ORDER — BENZONATATE 100 MG PO CAPS: 100.0000 mg | ORAL_CAPSULE | Freq: Three times a day (TID) | ORAL | Status: DC

## 2015-01-15 MED ORDER — SODIUM CHLORIDE 0.9 % IV SOLN
INTRAVENOUS | Status: DC
  Administered 2015-01-15: 20:00:00 via INTRAVENOUS

## 2015-01-15 NOTE — Progress Notes (Addendum)
Subjective:    Patient ID: Claudia Luna, female    DOB: Sep 23, 1955, 59 y.o.   MRN: [id number]  HPI   59 year old Caucasian female, patient of Dr. Artist Pais, who I am seeing in his absence. She presents to the office today with a slightly confusing story. On November 21 she started off with what was perceived as sinus infection at that time to was prescribed doxycycline which she started on November 28 of finished on December 5. She reports "this was like taking sugar pills". Approximately on December 6 she started on a 5 day course of Levaquin, she finished this last night. Today she reports that she feels approximately 50-60% better but continues to cough up mucus. She also complains of shortness of breath, fatigue, dizziness, and no appetite. Originally in  the office her pulse was 154, after sitting down for a while her pulse rate lowered to 125. She endorses that over the last year her pulse rate is normally between 120s and 140s.   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 2-D echo that was done in October 2014 at a small, free-flowing pericardial effusion with no evidence of hemodynamic compromise. Her EF at this time was 60-65%. Other 2-D echo that was done in April 2016 showed normal cavity size and again an estimated EF in the range of 50-60%, patient endorses that this echo was never done.   In the office today he mentions that over the last year since being diagnosed with "endocarditis" she continues to have a persistent cough, but she is taking narcotic pain medication for her fibromyalgia which suppresses the cough. Also endorses that she has night sweats, she is always hot, has decreased appetite shortness of breath and fatigue. He states "I feel exhausted all the time, ice to be a walk 10 climb and enters a day even with fibromyalgia but am unable to do this anymore. I continue to gain weight  from not exercising."  She has followed up with pulmonary and cardiology in the past.    Review of Systems  Constitutional: Positive for fever, chills, diaphoresis, activity change, appetite change, fatigue and unexpected weight change.  HENT: Positive for postnasal drip, rhinorrhea and sinus pressure.   Eyes: Negative.   Respiratory: Positive for cough, chest tightness and shortness of breath.   Cardiovascular: Positive for palpitations. Negative for chest pain and leg swelling.  Gastrointestinal: Negative.   Endocrine: Positive for heat intolerance. Negative for cold intolerance.  Skin: Negative.   Neurological: Positive for weakness. Negative for dizziness and light-headedness.  Hematological: Negative.   Psychiatric/Behavioral: Negative.    Past Medical History  Diagnosis Date  . Thyroid disease     hypothyroid  . Anxiety   . Depression   . Asthma   . Hyperlipidemia   . Headache(784.0)   . Chronic kidney disease     kidney stones, history of  . Fibromyalgia   . Gout     Social History   Social History  . Marital Status: Married    Spouse Name: Merlyn Albert  . Number of Children: 4  . Years of Education: N/A   Occupational History  . Freight forwarder    Social History Main Topics  . Smoking status: Never Smoker   . Smokeless tobacco: Never Used  . Alcohol Use: 0.0 oz/week    0 Standard drinks or equivalent per week     Comment: 2-4 drinks/year  .  Drug Use: No  . Sexual Activity: Yes   Other Topics Concern  . Not on file   Social History Narrative   Regular exercise: yes    Past Surgical History  Procedure Laterality Date  . Breast surgery  1993    reduction  . Abdominal hysterectomy    . Knee arthroscopy      total knee  . Appendectomy    . Cholecystectomy    . Splenic artery embolization      Family History  Problem Relation Age of Onset  . Breast cancer Mother     breast  . Stroke Mother     "mini"  . Coronary artery disease Father      status post CABG  . Peripheral vascular disease Father   . Hypertension Father   . Hyperlipidemia Father   . Stroke Father     multiple  . Hypertension Sister   . High Cholesterol Sister   . GER disease Sister     Allergies  Allergen Reactions  . Codeine     REACTION: VOMITING  . Penicillins     REACTION: RASH  . Pregabalin     REACTION: swelling and weight gain    No current facility-administered medications on file prior to visit.   Current Outpatient Prescriptions on File Prior to Visit  Medication Sig Dispense Refill  . bisacodyl (DULCOLAX) 5 MG EC tablet Take 10 mg by mouth at bedtime. Pt takes 2 tabs every other night    . estradiol (MINIVELLE) 0.05 MG/24HR patch Place 1 patch (0.05 mg total) onto the skin 2 (two) times a week. On Monday/Thursday 24 patch 3  . gabapentin (NEURONTIN) 600 MG tablet Take 600 mg by mouth 3 (three) times daily.    Marland Kitchen ibuprofen (ADVIL,MOTRIN) 200 MG tablet 4 tabs three times daily as needed for joint pain - take with food    . levothyroxine (SYNTHROID, LEVOTHROID) 125 MCG tablet Take 1 tablet (125 mcg total) by mouth daily. 90 tablet 3  . OXYCONTIN 15 MG T12A 12 hr tablet Take 10 mg by mouth 3 (three) times daily as needed.     Marland Kitchen PARoxetine (PAXIL) 20 MG tablet Take 1 tablet (20 mg total) by mouth every morning. 90 tablet 3  . rizatriptan (MAXALT) 10 MG tablet Take 1 tablet (10 mg total) by mouth daily as needed. For headache 10 tablet 3  . rosuvastatin (CRESTOR) 20 MG tablet Take 1 tablet (20 mg total) by mouth daily. 90 tablet 3  . valACYclovir (VALTREX) 1000 MG tablet TAKE 1 TABLET BY MOUTH DAILY AS DIRECTED WHEN NEEDED FOR COLD SORES 30 tablet 0  . Vitamin D, Ergocalciferol, (DRISDOL) 50000 UNITS CAPS capsule Take 1 capsule (50,000 Units total) by mouth every 7 (seven) days. 12 capsule 3    BP 100/70 mmHg  Pulse 125  Temp(Src) 98 F (36.7 C) (Oral)  Ht  (1.549 m)  Wt 161 lb (73.029 kg)  BMI 30.44 kg/m2  SpO2 99%         Objective:   Physical Exam  Constitutional: She is oriented to person, place, and time. She appears well-developed and well-nourished. No distress.  Appears tired and worn out  HENT:  Head: Normocephalic and atraumatic.  Right Ear: External ear normal.  Left Ear: External ear normal.  Nose: Nose normal.  Mouth/Throat: Oropharynx is clear and moist.  Neck: Normal range of motion. Neck supple. No thyromegaly present.  Cardiovascular: Normal heart sounds and intact distal pulses.  Exam  reveals no gallop and no friction rub.   No murmur heard. tachcardic  Pulmonary/Chest: Effort normal and breath sounds normal. No respiratory distress. She has no wheezes. She has no rales. She exhibits no tenderness.  Dry cough  Lymphadenopathy:    She has no cervical adenopathy.  Neurological: She is alert and oriented to person, place, and time.  Skin: Skin is warm. No rash noted. She is not diaphoretic. No erythema. No pallor.  moist  Psychiatric: She has a normal mood and affect. Her behavior is normal. Judgment and thought content normal.  Nursing note and vitals reviewed.     Assessment & Plan:  1. SOB (shortness of breath) - Cannot find any mention of endocarditis in her previous notes. Unsure if patient is confused between endocarditis and pericardial effusion.  - EKG 12-Lead - ST, rate 106 -Due to signs and symptoms cannot rule out endocarditis at this point. I feel that she needs a further workup to be evaluated in the emergency room. Discussing with patient about this plan of care she is agreeable as well as her husband. She does not want to take an ambulance to the emergency room and her husband will drive her there. 2. Acute upper respiratory infection - Recently been on doxycycline and Levaquin. Appears that her sinus infection is improving over time. Do not want to start on any other antibiotics at this point in time  3. Tachycardia - EKG 12-Lead -Not done in the emergency room  consider echocardiogram and follow-up with new cardiologist

## 2015-01-15 NOTE — ED Notes (Signed)
Pt ambulating independently w/ steady gait on d/c in no acute distress, A&Ox4. D/c instructions reviewed w/ pt and family - pt and family deny any further questions or concerns at present. Rx given x1  

## 2015-01-15 NOTE — ED Notes (Signed)
Pt was on Doxycycline then Levaquin for sinus infection that started on 12/24/14.  Pt was sent here today due to elevated HR around 150s.  Pt saw cardiologist while back for endocarditis and elevated HR.  Pt sees a pain specialist once a month and baseline HR is 120-140 when she is at the office.  Pt states that she doesn't have any energy anymore and weakness, but thought was related to fibromyalgia.

## 2015-01-15 NOTE — ED Provider Notes (Signed)
CSN: [id number]     Arrival date & time 01/15/15  1729 History   First MD Initiated Contact with Patient 01/15/15 1815     Chief Complaint  Patient presents with  . sent by PCP   . Tachycardia     (Consider location/radiation/quality/duration/timing/severity/associated sxs/prior Treatment) HPI Comments: Patient here complaining of increasing weakness with cough and shortness of breath 2 years. Patient has been seen by her Dr. in treated for supposed a sinus infection and states that her heart rate at the physician's office has been up to 130. This is not been adjustment prior to recent office visits. Today wishing to the office heart was noted to be up to 130 when she walks up to 154. Patient notes dyspnea on exertion. Cough has been nonproductive. Denies any fever or chills. No vomiting or diarrhea. Denies any chest pain or chest pressure. No treatment given prior to arrival.  The history is provided by the patient.    Past Medical History  Diagnosis Date  . Thyroid disease     hypothyroid  . Anxiety   . Depression   . Asthma   . Hyperlipidemia   . Headache(784.0)   . Chronic kidney disease     kidney stones, history of  . Fibromyalgia   . Gout    Past Surgical History  Procedure Laterality Date  . Breast surgery  1993    reduction  . Abdominal hysterectomy    . Knee arthroscopy      total knee  . Appendectomy    . Cholecystectomy    . Splenic artery embolization     Family History  Problem Relation Age of Onset  . Breast cancer Mother     breast  . Stroke Mother     "mini"  . Coronary artery disease Father     status post CABG  . Peripheral vascular disease Father   . Hypertension Father   . Hyperlipidemia Father   . Stroke Father     multiple  . Hypertension Sister   . High Cholesterol Sister   . GER disease Sister    Social History  Substance Use Topics  . Smoking status: Never Smoker   . Smokeless tobacco: Never Used  . Alcohol Use: 0.0 oz/week     0 Standard drinks or equivalent per week     Comment: 2-4 drinks/year   OB History    No data available     Review of Systems  All other systems reviewed and are negative.     Allergies  Codeine; Penicillins; and Pregabalin  Home Medications   Prior to Admission medications   Medication Sig Start Date End Date Taking? Authorizing Provider  bisacodyl (DULCOLAX) 5 MG EC tablet Take 10 mg by mouth at bedtime. Pt takes 2 tabs every other night    Historical Provider, MD  estradiol (MINIVELLE) 0.05 MG/24HR patch Place 1 patch (0.05 mg total) onto the skin 2 (two) times a week. On Monday/Thursday 10/03/14   Kristian CoveyBruce W Burchette, MD  gabapentin (NEURONTIN) 600 MG tablet Take 600 mg by mouth 3 (three) times daily.    Historical Provider, MD  ibuprofen (ADVIL,MOTRIN) 200 MG tablet 4 tabs three times daily as needed for joint pain - take with food    Historical Provider, MD  levothyroxine (SYNTHROID, LEVOTHROID) 125 MCG tablet Take 1 tablet (125 mcg total) by mouth daily. 10/03/14   Kristian CoveyBruce W Burchette, MD  OXYCONTIN 15 MG T12A 12 hr tablet Take 10 mg by mouth  3 (three) times daily as needed.  02/14/13   Historical Provider, MD  PARoxetine (PAXIL) 20 MG tablet Take 1 tablet (20 mg total) by mouth every morning. 10/03/14   Kristian Covey, MD  rizatriptan (MAXALT) 10 MG tablet Take 1 tablet (10 mg total) by mouth daily as needed. For headache 10/03/14   Kristian Covey, MD  rosuvastatin (CRESTOR) 20 MG tablet Take 1 tablet (20 mg total) by mouth daily. 10/03/14   Kristian Covey, MD  valACYclovir (VALTREX) 1000 MG tablet TAKE 1 TABLET BY MOUTH DAILY AS DIRECTED WHEN NEEDED FOR COLD SORES 11/25/13   Toniann Ket, PA-C  Vitamin D, Ergocalciferol, (DRISDOL) 50000 UNITS CAPS capsule Take 1 capsule (50,000 Units total) by mouth every 7 (seven) days. 10/03/14   Kristian Covey, MD   BP 138/109 mmHg  Pulse 130  Temp(Src) 98.3 F (36.8 C) (Oral)  Resp 19  SpO2 98% Physical Exam  Constitutional:  She is oriented to person, place, and time. She appears well-developed and well-nourished.  Non-toxic appearance. No distress.  HENT:  Head: Normocephalic and atraumatic.  Eyes: Conjunctivae, EOM and lids are normal. Pupils are equal, round, and reactive to light.  Neck: Normal range of motion. Neck supple. No tracheal deviation present. No thyroid mass present.  Cardiovascular: Regular rhythm and normal heart sounds.  Tachycardia present.  Exam reveals no gallop.   No murmur heard. Pulmonary/Chest: Effort normal and breath sounds normal. No stridor. No respiratory distress. She has no decreased breath sounds. She has no wheezes. She has no rhonchi. She has no rales.  Abdominal: Soft. Normal appearance and bowel sounds are normal. She exhibits no distension. There is no tenderness. There is no rebound and no CVA tenderness.  Musculoskeletal: Normal range of motion. She exhibits no edema or tenderness.  Neurological: She is alert and oriented to person, place, and time. She has normal strength. No cranial nerve deficit or sensory deficit. GCS eye subscore is 4. GCS verbal subscore is 5. GCS motor subscore is 6.  Skin: Skin is warm and dry. No abrasion and no rash noted.  Psychiatric: She has a normal mood and affect. Her speech is normal and behavior is normal.  Nursing note and vitals reviewed.   ED Course  Procedures (including critical care time) Labs Review Labs Reviewed  TROPONIN I  CBC WITH DIFFERENTIAL/PLATELET  COMPREHENSIVE METABOLIC PANEL  BRAIN NATRIURETIC PEPTIDE  TSH  T4    Imaging Review No results found. I have personally reviewed and evaluated these images and lab results as part of my medical decision-making.   EKG Interpretation   Date/Time:  Monday January 15 2015 17:40:02 EST Ventricular Rate:  107 PR Interval:  127 QRS Duration: 112 QT Interval:  351 QTC Calculation: 468 R Axis:   -53 Text Interpretation:  Sinus tachycardia Borderline IVCD with LAD Low   voltage, precordial leads RSR' in V1 or V2, right VCD or RVH Baseline  wander in lead(s) V6 No significant change since last tracing Confirmed by  Shayleigh Bouldin  MD, Renesmae Donahey ([id number]) on 01/15/2015 6:43:12 PM      MDM   Final diagnoses:  SOB (shortness of breath)   Patient's heart rate has been labile here and when speaking with her when she sustained down has been in the high 90s. Patient's TSH is within normal limits now. She denies any dyspnea. Her heart rate does seem to increase when she is speaking and better when she is relaxed. Will give her a prescription for  cough medication and referral to cardiology     Lorre Nick, MD 01/15/15 2217

## 2015-01-15 NOTE — Discharge Instructions (Signed)
Cough, Adult Coughing is a reflex that clears your throat and your airways. Coughing helps to heal and protect your lungs. It is normal to cough occasionally, but a cough that happens with other symptoms or lasts a long time may be a sign of a condition that needs treatment. A cough may last only 2-3 weeks (acute), or it may last longer than 8 weeks (chronic). CAUSES Coughing is commonly caused by:  Breathing in substances that irritate your lungs.  A viral or bacterial respiratory infection.  Allergies.  Asthma.  Postnasal drip.  Smoking.  Acid backing up from the stomach into the esophagus (gastroesophageal reflux).  Certain medicines.  Chronic lung problems, including COPD (or rarely, lung cancer).  Other medical conditions such as heart failure. HOME CARE INSTRUCTIONS  Pay attention to any changes in your symptoms. Take these actions to help with your discomfort:  Take medicines only as told by your health care provider.  If you were prescribed an antibiotic medicine, take it as told by your health care provider. Do not stop taking the antibiotic even if you start to feel better.  Talk with your health care provider before you take a cough suppressant medicine.  Drink enough fluid to keep your urine clear or pale yellow.  If the air is dry, use a cold steam vaporizer or humidifier in your bedroom or your home to help loosen secretions.  Avoid anything that causes you to cough at work or at home.  If your cough is worse at night, try sleeping in a semi-upright position.  Avoid cigarette smoke. If you smoke, quit smoking. If you need help quitting, ask your health care provider.  Avoid caffeine.  Avoid alcohol.  Rest as needed. SEEK MEDICAL CARE IF:   You have new symptoms.  You cough up pus.  Your cough does not get better after 2-3 weeks, or your cough gets worse.  You cannot control your cough with suppressant medicines and you are losing sleep.  You  develop pain that is getting worse or pain that is not controlled with pain medicines.  You have a fever.  You have unexplained weight loss.  You have night sweats. SEEK IMMEDIATE MEDICAL CARE IF:  You cough up blood.  You have difficulty breathing.  Your heartbeat is very fast.   This information is not intended to replace advice given to you by your health care provider. Make sure you discuss any questions you have with your health care provider.   Document Released: 07/19/2010 Document Revised: 10/11/2014 Document Reviewed: 03/29/2014 Elsevier Interactive Patient Education 2016 [employer] Nonspecific Tachycardia Tachycardia is a faster than normal heartbeat (more than 100 beats per minute). In adults, the heart normally beats between 60 and 100 times a minute. A fast heartbeat may be a normal response to exercise or stress. It does not necessarily mean that something is wrong. However, sometimes when your heart beats too fast it may not be able to pump enough blood to the rest of your body. This can result in chest pain, shortness of breath, dizziness, and even fainting. Nonspecific tachycardia means that the specific cause or pattern of your tachycardia is unknown. CAUSES  Tachycardia may be harmless or it may be due to a more serious underlying cause. Possible causes of tachycardia include:  Exercise or exertion.  Fever.  Pain or injury.  Infection.  Loss of body fluids (dehydration).  Overactive thyroid.  Lack of red blood cells (anemia).  Anxiety and stress.  Alcohol.  Caffeine.  Tobacco products.  Diet pills.  Illegal drugs.  Heart disease. SYMPTOMS  Rapid or irregular heartbeat (palpitations).  Suddenly feeling your heart beating (cardiac awareness).  Dizziness.  Tiredness (fatigue).  Shortness of breath.  Chest pain.  Nausea.  Fainting. DIAGNOSIS  Your caregiver will perform a physical exam and take your medical history. In some  cases, a heart specialist (cardiologist) may be consulted. Your caregiver may also order:  Blood tests.  Electrocardiography. This test records the electrical activity of your heart.  A heart monitoring test. TREATMENT  Treatment will depend on the likely cause of your tachycardia. The goal is to treat the underlying cause of your tachycardia. Treatment methods may include:  Replacement of fluids or blood through an intravenous (IV) tube for moderate to severe dehydration or anemia.  New medicines or changes in your current medicines.  Diet and lifestyle changes.  Treatment for certain infections.  Stress relief or relaxation methods. HOME CARE INSTRUCTIONS   Rest.  Drink enough fluids to keep your urine clear or pale yellow.  Do not smoke.  Avoid:  Caffeine.  Tobacco.  Alcohol.  Chocolate.  Stimulants such as over-the-counter diet pills or pills that help you stay awake.  Situations that cause anxiety or stress.  Illegal drugs such as marijuana, phencyclidine (PCP), and cocaine.  Only take medicine as directed by your caregiver.  Keep all follow-up appointments as directed by your caregiver. SEEK IMMEDIATE MEDICAL CARE IF:   You have pain in your chest, upper arms, jaw, or neck.  You become weak, dizzy, or feel faint.  You have palpitations that will not go away.  You vomit, have diarrhea, or pass blood in your stool.  Your skin is cool, pale, and wet.  You have a fever that will not go away with rest, fluids, and medicine. MAKE SURE YOU:   Understand these instructions.  Will watch your condition.  Will get help right away if you are not doing well or get worse.   This information is not intended to replace advice given to you by your health care provider. Make sure you discuss any questions you have with your health care provider.   Document Released: 02/28/2004 Document Revised: 04/14/2011 Document Reviewed: 08/04/2014 Elsevier Interactive  Patient Education [employer]

## 2015-01-17 LAB — T4: T4, Total: 13.2 ug/dL — ABNORMAL HIGH (ref 4.5–12.0)

## 2015-01-18 ENCOUNTER — Ambulatory Visit (INDEPENDENT_AMBULATORY_CARE_PROVIDER_SITE_OTHER): Payer: Managed Care, Other (non HMO) | Admitting: Cardiology

## 2015-01-18 ENCOUNTER — Encounter: Payer: Self-pay | Admitting: Cardiology

## 2015-01-18 VITALS — BP 82/62 | HR 98 | Ht 61.0 in | Wt 161.8 lb

## 2015-01-18 DIAGNOSIS — R05 Cough: Secondary | ICD-10-CM

## 2015-01-18 DIAGNOSIS — M797 Fibromyalgia: Secondary | ICD-10-CM

## 2015-01-18 DIAGNOSIS — R079 Chest pain, unspecified: Secondary | ICD-10-CM | POA: Diagnosis not present

## 2015-01-18 DIAGNOSIS — R0600 Dyspnea, unspecified: Secondary | ICD-10-CM

## 2015-01-18 DIAGNOSIS — R059 Cough, unspecified: Secondary | ICD-10-CM

## 2015-01-18 DIAGNOSIS — R000 Tachycardia, unspecified: Secondary | ICD-10-CM

## 2015-01-18 DIAGNOSIS — I952 Hypotension due to drugs: Secondary | ICD-10-CM

## 2015-01-18 NOTE — Patient Instructions (Addendum)
Medication Instructions:  Your physician recommends that you continue on your current medications as directed. Please refer to the Current Medication list given to you today.   Labwork: None ordered  Testing/Procedures: Your physician has requested that you have an echocardiogram. Echocardiography is a painless test that uses sound waves to create images of your heart. It provides your doctor with information about the size and shape of your heart and how well your heart's chambers and valves are working. This procedure takes approximately one hour. There are no restrictions for this procedure.   Follow-Up: You have been referred to EP (Dr.Klein) to be scheduled as soon as possible  Your physician recommends that you schedule a follow-up appointment with your Pulmonologist asap    Any Other Special Instructions Will Be Listed Below (If Applicable).     If you need a refill on your cardiac medications before your next appointment, please call your pharmacy.

## 2015-01-18 NOTE — Progress Notes (Signed)
Cardiology Office Note   Date:  01/18/2015   ID:  Claudia Luna, Claudia Luna, MRN [id number]  PCP:  Shirline Frees, NP  Cardiologist:  Dr. Tenny Craw ??   Chief Complaint  Patient presents with  . Hospitalization Follow-up    tachycardia, weakness      History of Present Illness: Claudia Luna is a 59 y.o. female who presents for post hospitalization-ER visit.   She had been treated for sinus infection and in office HR 130, with walking HR up to 154.  Pt had weakness and cough, SOB.   Her BP was 138/109    She has a hx of pericardial effusion-small in 2014 treated with colchicine and prednisone.  Also SOB with exertion which has been recurrent.   She had a normal nuc study with EF 59% in April 2016- with hx atherosclerosis on CT of chest.   Last Echo 05/2014 with EF 55-60% G1DD.   She is followed by Pulmonary- Dr. Sherene Sires and saw them last in 05/2014.  She did not follow up.   She is followed in pain clinic for fibromyalgia.      Today she tells me since 2014 she has been debilitated - mostly chair bound at home, unable to do housework if she does make it to the grocery store she can buy one bag of groceries before she must go home and lie down.  She complains of some Lt ant chest pain some in her arm at night.  With this improved with lying down.  She complains of some nausea and DOE.  All of this is pretty much how it has been for 2 years though now her HR is increasing more than ever.    She has chronic cough that has only improved with steroids which she hates to take due to wt gain.    She has occ edema of legs and abd and will take 20 of lasix about each week.  Last dose was Monday.  Today her BP is low but no lightheadedness or dizziness.  No pain currently.  She is anxious about all her symptoms.      Past Medical History  Diagnosis Date  . Thyroid disease     hypothyroid  . Anxiety   . Depression   . Asthma   . Hyperlipidemia   . Headache(784.0)   .  Chronic kidney disease     kidney stones, history of  . Fibromyalgia   . Gout     Past Surgical History  Procedure Laterality Date  . Breast surgery  1993    reduction  . Abdominal hysterectomy    . Knee arthroscopy      total knee  . Appendectomy    . Cholecystectomy    . Splenic artery embolization       Current Outpatient Prescriptions  Medication Sig Dispense Refill  . benzonatate (TESSALON) 100 MG capsule Take 1 capsule (100 mg total) by mouth every 8 (eight) hours. 21 capsule 0  . bisacodyl (DULCOLAX) 5 MG EC tablet Take 10 mg by mouth at bedtime. Pt takes 2 tabs every other night    . estradiol (MINIVELLE) 0.05 MG/24HR patch Place 1 patch (0.05 mg total) onto the skin 2 (two) times a week. On Monday/Thursday 24 patch 3  . furosemide (LASIX) 20 MG tablet Take 20 mg by mouth once a week. PATIENT TAKE ONETABLET 20 MG BY MOUTH BY ONCE WEEKLY AS NEEDED FOR FLUID BUILDUP    . gabapentin (NEURONTIN)  600 MG tablet Take 600 mg by mouth 3 (three) times daily.    Marland Kitchen ibuprofen (ADVIL,MOTRIN) 200 MG tablet 4 tabs three times daily as needed for joint pain - take with food    . levofloxacin (LEVAQUIN) 500 MG tablet Take 500 mg by mouth daily.    Marland Kitchen levothyroxine (SYNTHROID, LEVOTHROID) 125 MCG tablet Take 1 tablet (125 mcg total) by mouth daily. 90 tablet 3  . oxyCODONE-acetaminophen (PERCOCET) 7.5-325 MG tablet Take 1 tablet by mouth every 6 (six) hours as needed. Pain.  0  . PARoxetine (PAXIL) 20 MG tablet Take 1 tablet (20 mg total) by mouth every morning. (Patient taking differently: Take 20 mg by mouth at bedtime. ) 90 tablet 3  . rizatriptan (MAXALT) 10 MG tablet Take 1 tablet (10 mg total) by mouth daily as needed. For headache 10 tablet 3  . rosuvastatin (CRESTOR) 20 MG tablet Take 1 tablet (20 mg total) by mouth daily. 90 tablet 3  . valACYclovir (VALTREX) 1000 MG tablet TAKE 1 TABLET BY MOUTH DAILY AS DIRECTED WHEN NEEDED FOR COLD SORES 30 tablet 0  . Vitamin D, Ergocalciferol,  (DRISDOL) 50000 UNITS CAPS capsule Take 1 capsule (50,000 Units total) by mouth every 7 (seven) days. 12 capsule 3   No current facility-administered medications for this visit.    Allergies:   Codeine; Penicillins; and Pregabalin    Social History:  The patient  reports that she has never smoked. She has never used smokeless tobacco. She reports that she drinks alcohol. She reports that she does not use illicit drugs.   Family History:  The patient's family history includes Breast cancer in her mother; Coronary artery disease in her father; GER disease in her sister; High Cholesterol in her sister; Hyperlipidemia in her father; Hypertension in her father and sister; Peripheral vascular disease in her father; Stroke in her father and mother.    ROS:  General:no colds or fevers, no weight changes Skin:no rashes or ulcers HEENT:no blurred vision, no congestion CV:see HPI PUL:see HPI GI:no diarrhea constipation or melena, no indigestion GU:no hematuria, no dysuria MS:no joint pain, no claudication Neuro:no syncope, + lightheadedness, + weakness Endo:no diabetes, + thyroid disease treated with normal TSH in ER recently  Wt Readings from Last 3 Encounters:  01/18/15 161 lb 12.8 oz (73.392 kg)  01/15/15 161 lb (73.029 kg)  10/03/14 167 lb (75.751 kg)     PHYSICAL EXAM: VS:  BP 82/62 mmHg  Pulse 98  Ht  (1.549 m)  Wt 161 lb 12.8 oz (73.392 kg)  BMI 30.59 kg/m2  SpO2 92% , BMI Body mass index is 30.59 kg/(m^2). General:Pleasant affect, NAD Skin:Warm and dry, brisk capillary refill HEENT:normocephalic, sclera clear, mucus membranes moist Neck:supple, no JVD, no bruits  Heart:S1S2 RRR without murmur, gallup, rub or click Lungs:clear without rales, rhonchi, or wheezes WGN:FAOZ, non tender, + BS, do not palpate liver spleen or masses Ext:no lower ext edema, 2+ pedal pulses, 2+ radial pulses Neuro:alert and oriented X 3, MAE, follows commands, + facial symmetry  With ambulation  SP02 to 92% on RA and HR up to 134.   EKG:  EKG is ordered today. The ekg ordered today demonstrates SR LAD, pul. Disease pattern, non specific ST and T wave abnormality. No acute changes.    Recent Labs: 05/08/2014: Pro B Natriuretic peptide (BNP) 27.0 01/15/2015: ALT 29; BUN 22*; Creatinine, Ser 0.73; Hemoglobin 15.2*; Platelets 320; Potassium 3.5; Sodium 140; TSH 0.505    Lipid Panel  Component Value Date/Time   CHOL 186 05/08/2014 1345   TRIG 273.0* 05/08/2014 1345   HDL 52.30 05/08/2014 1345   CHOLHDL 4 05/08/2014 1345   VLDL 54.6* 05/08/2014 1345   LDLCALC 104* 08/04/2011 0932   LDLDIRECT 124.0 05/08/2014 1345       Other studies Reviewed: Additional studies/ records that were reviewed today include: monitor in 2014- ST she was aware of the ST.   ASSESSMENT AND PLAN:  1.  Weakness- limited echo for pericardial effusion ? Though this may be due to hypotension and tachycardia, discussed with Dr. Clifton JamesMcAlhany and we will refer to Dr. Graciela HusbandsKlein for inappropriate tachycardia and possible POTS   -other issue the amount fibromyalgia is playing with weakness  2. Cough persistent- I offered steroids again but pt prefers not to take, I have asked her to follow up with Pulmonary- if primary lung issue then this would explain tachycardia.  3.  Hypotension today but other visits including ER visits with BP 100 systolic or greater.  4. Chest pain with recent normal nuc study and no acute changes of EKG most likely chest wall irritation with cough vs. Fibromyalgia.  5. Anxiety - she would like to be treated quickly, we discussed that she has had these issues for 2 years and answers may not be coming quickly.  She admits to not following up at times.    6. Hypothyroid to seed Endo in future.   For cardiology follow up pt prefers to see another general cardiologist - not Dr. Tenny Crawoss.  Perhaps Dr. Duke Salviaandolph, will leave to Dr. Graciela HusbandsKlein.     Current medicines are reviewed with the patient today.   The patient Has no concerns regarding medicines.  The following changes have been made:  See above Labs/ tests ordered today include:see above  Disposition:   FU:  see above  Nyoka LintSigned, INGOLD,LAURA R, NP  01/18/2015 12:45 PM    [facility] [address], [place], [place]  [address]/ [address] [place], [place] Phone: [phone]; Fax: [phone]  [phone]

## 2015-01-31 ENCOUNTER — Ambulatory Visit (INDEPENDENT_AMBULATORY_CARE_PROVIDER_SITE_OTHER): Payer: Managed Care, Other (non HMO) | Admitting: Internal Medicine

## 2015-01-31 ENCOUNTER — Encounter: Payer: Self-pay | Admitting: Internal Medicine

## 2015-01-31 VITALS — BP 112/66 | HR 80 | Ht 61.0 in | Wt 163.0 lb

## 2015-01-31 VITALS — BP 100/70 | HR 91 | Temp 98.4°F | Wt 161.0 lb

## 2015-01-31 DIAGNOSIS — Z23 Encounter for immunization: Secondary | ICD-10-CM | POA: Diagnosis not present

## 2015-01-31 DIAGNOSIS — R5382 Chronic fatigue, unspecified: Secondary | ICD-10-CM | POA: Diagnosis not present

## 2015-01-31 DIAGNOSIS — R6889 Other general symptoms and signs: Secondary | ICD-10-CM | POA: Diagnosis not present

## 2015-01-31 DIAGNOSIS — R000 Tachycardia, unspecified: Secondary | ICD-10-CM

## 2015-01-31 DIAGNOSIS — E039 Hypothyroidism, unspecified: Secondary | ICD-10-CM

## 2015-01-31 LAB — VITAMIN B12: Vitamin B-12: 318 pg/mL (ref 211–911)

## 2015-01-31 LAB — FERRITIN: Ferritin: 84.4 ng/mL (ref 10.0–291.0)

## 2015-01-31 NOTE — Patient Instructions (Signed)
Please stop at the lab.  Please collect a 24h urine as advised: Patient information (Up-to-Date): Collection of a 24-hour urine specimen  - You should collect every drop of urine during each 24-hour period. It does not matter how much or little urine is passed each time, as long as every drop is collected. - Begin the urine collection in the morning after you wake up, after you have emptied your bladder for the first time. - Urinate (empty the bladder) for the first time and flush it down the toilet. Note the exact time (eg, 6:15 AM). You will begin the urine collection at this time. - Collect every drop of urine during the day and night in an empty collection bottle. Store the bottle at room temperature or in the refrigerator. - If you need to have a bowel movement, any urine passed with the bowel movement should be collected. Try not to include feces with the urine collection. If feces does get mixed in, do not try to remove the feces from the urine collection bottle. - Finish by collecting the first urine passed the next morning, adding it to the collection bottle. This should be within ten minutes before or after the time of the first morning void on the first day (which was flushed). In this example, you would try to void between 6:05 and 6:25 on the second day. - If you need to urinate one hour before the final collection time, drink a full glass of water so that you can void again at the appropriate time. If you have to urinate 20 minutes before, try to hold the urine until the proper time. - Please note the exact time of the final collection, even if it is not the same time as when collection began on day 1. - The bottle(s) may be kept at room temperature for a day or two, but should be kept cool or refrigerated for longer periods of time.  Please do not use any of the listed foods during the collection. Also, no steroid products!  Please come back at 8 am for 1h to test you for adrenal  insufficiency. You need to schedule a lab appt for this.  We will schedule a new appt if the results above are abnormal.

## 2015-01-31 NOTE — Patient Instructions (Addendum)
Medication Instructions: 1) Stop Crestor  Labwork: - none  Procedures/Testing: - Your physician has recommended that you wear a 48 hour holter monitor. Holter monitors are medical devices that record the heart's electrical activity. Doctors most often use these monitors to diagnose arrhythmias. Arrhythmias are problems with the speed or rhythm of the heartbeat. The monitor is a small, portable device. You can wear one while you do your normal daily activities. This is usually used to diagnose what is causing palpitations/syncope (passing out).  - Your physician has requested that you have an exercise tolerance test in 6- 8 weeks with Dr. Graciela HusbandsKlein (ok to double book on MD schedule). For further information please visit [email or web address]. Please also follow instruction sheet, as given.  Follow-Up: - Pending results of the holter/ treadmill test  Any Additional Special Instructions Will Be Listed Below (If Applicable).

## 2015-01-31 NOTE — Progress Notes (Signed)
ELECTROPHYSIOLOGY CONSULT NOTE  Patient ID: Claudia Luna, MRN: [id number], DOB/AGE: Apr 18, 1955 59 y.o. Admit date: (Not on file) Date of Consult: 01/31/2015  Primary Physician: Dorothyann Peng, NP Primary Cardiologist: PR  Chief Complaint: fatgiue and tahcycardia    HPI Claudia Luna is a 59 y.o. female refererd because of sinus tachycardia  Over the last two years in teh setting of chronic fatigue and fibromyalgia she has becoming progressively fatigued to the point of disability  Theres some associated DOE which has worsened;  Temporally assoc weight gain  Started with cough and nausea a few years ago, RX and somewhat responsive to  CT was mildly abnormal -ground glass- but per SN notes, objective findings not as severe as symptoms   Echo had showed small effusion.  ESR have been normal throughout   She has recently seen endo whose notes were reveiewed and whose plan and thoughts appreciated  Echo 4/16 -- normal LV function   Past Medical History  Diagnosis Date  . Thyroid disease     hypothyroid  . Anxiety   . Depression   . Asthma   . Hyperlipidemia   . Headache(784.0)   . Chronic kidney disease     kidney stones, history of  . Fibromyalgia   . Gout       Surgical History:  Past Surgical History  Procedure Laterality Date  . Breast surgery  1993    reduction  . Abdominal hysterectomy    . Knee arthroscopy      total knee  . Appendectomy    . Cholecystectomy    . Splenic artery embolization       Home Meds: Prior to Admission medications   Medication Sig Start Date End Date Taking? Authorizing Provider  benzonatate (TESSALON) 100 MG capsule Take 1 capsule (100 mg total) by mouth every 8 (eight) hours. 01/15/15  Yes Lacretia Leigh, MD  bisacodyl (DULCOLAX) 5 MG EC tablet Take 10 mg by mouth at bedtime. Pt takes 2 tabs every other night   Yes Historical Provider, MD  estradiol (MINIVELLE) 0.05 MG/24HR patch Place 1 patch (0.05 mg total)  onto the skin 2 (two) times a week. On Monday/Thursday 10/03/14  Yes Eulas Post, MD  furosemide (LASIX) 20 MG tablet Take 20 mg by mouth once a week. PATIENT TAKE ONETABLET 20 MG BY MOUTH BY ONCE WEEKLY AS NEEDED FOR FLUID BUILDUP   Yes Historical Provider, MD  gabapentin (NEURONTIN) 600 MG tablet Take 600 mg by mouth 3 (three) times daily.   Yes Historical Provider, MD  ibuprofen (ADVIL,MOTRIN) 200 MG tablet 4 tabs three times daily as needed for joint pain - take with food   Yes Historical Provider, MD  levothyroxine (SYNTHROID, LEVOTHROID) 125 MCG tablet Take 1 tablet (125 mcg total) by mouth daily. 10/03/14  Yes Eulas Post, MD  oxyCODONE-acetaminophen (PERCOCET) 7.5-325 MG tablet Take 1 tablet by mouth every 6 (six) hours as needed. Pain. 12/25/14  Yes Historical Provider, MD  PARoxetine (PAXIL) 20 MG tablet Take 1 tablet (20 mg total) by mouth every morning. Patient taking differently: Take 20 mg by mouth at bedtime.  10/03/14  Yes Eulas Post, MD  rizatriptan (MAXALT) 10 MG tablet Take 1 tablet (10 mg total) by mouth daily as needed. For headache 10/03/14  Yes Eulas Post, MD  rosuvastatin (CRESTOR) 20 MG tablet Take 1 tablet (20 mg total) by mouth daily. 10/03/14  Yes Eulas Post, MD  valACYclovir Estell Harpin)  1000 MG tablet TAKE 1 TABLET BY MOUTH DAILY AS DIRECTED WHEN NEEDED FOR COLD SORES 11/25/13  Yes Zenaida Niece, PA-C  Vitamin D, Ergocalciferol, (DRISDOL) 50000 UNITS CAPS capsule Take 1 capsule (50,000 Units total) by mouth every 7 (seven) days. 10/03/14  Yes Eulas Post, MD    Allergies:  Allergies  Allergen Reactions  . Codeine Nausea And Vomiting  . Penicillins        . Pregabalin Other (See Comments)    Edema, weight gain    Social History   Social History  . Marital Status: Married    Spouse Name: Josph Macho  . Number of Children: 4  . Years of Education: N/A   Occupational History  . Location manager    Social History Main  Topics  . Smoking status: Never Smoker   . Smokeless tobacco: Never Used  . Alcohol Use: 0.0 oz/week    0 Standard drinks or equivalent per week     Comment: 2-4 drinks/year  . Drug Use: No  . Sexual Activity: Yes   Other Topics Concern  . Not on file   Social History Narrative   Regular exercise: yes     Family History  Problem Relation Age of Onset  . Breast cancer Mother     breast  . Stroke Mother     "mini"  . Coronary artery disease Father     status post CABG  . Peripheral vascular disease Father   . Hypertension Father   . Hyperlipidemia Father   . Stroke Father     multiple  . Hypertension Sister   . High Cholesterol Sister   . GER disease Sister      ROS:  Please see the history of present illness.     All other systems reviewed and negative.    Physical Exam: Blood pressure 112/66, pulse 80, height '5\' 1"'$  (1.549 m), weight 163 lb (73.936 kg). General: Well developed, well nourished female in no acute distress. Head: Normocephalic, atraumatic, sclera non-icteric, no xanthomas, nares are without discharge. EENT: normal  Lymph Nodes:  none Neck: Negative for carotid bruits. JVD not elevated. Back:without scoliosis kyphosis Lungs: Clear bilaterally to auscultation without wheezes, rales, or rhonchi. Breathing is unlabored. Heart: RRR with S1 S2. No   murmur . No rubs, or gallops appreciated. Abdomen: Soft, non-tender, non-distended with normoactive bowel sounds. No hepatomegaly. No rebound/guarding. No obvious abdominal masses. Msk:  Strength and tone appear normal for age. Extremities: No clubbing or cyanosis. No edema.  Distal pedal pulses are 2+ and equal bilaterally. Skin: Warm and Dry Neuro: Alert and oriented X 3. CN III-XII intact Grossly normal sensory and motor function . Psych:  Responds to questions appropriately with a normal affect.      Labs: Cardiac Enzymes No results for input(s): CKTOTAL, CKMB, TROPONINI in the last 72 hours. CBC Lab  Results  Component Value Date   WBC 9.5 01/15/2015   HGB 15.2* 01/15/2015   HCT 44.8 01/15/2015   MCV 90.5 01/15/2015   PLT 320 01/15/2015   PROTIME: No results for input(s): LABPROT, INR in the last 72 hours. Chemistry No results for input(s): NA, K, CL, CO2, BUN, CREATININE, CALCIUM, PROT, BILITOT, ALKPHOS, ALT, AST, GLUCOSE in the last 168 hours.  Invalid input(s): LABALBU Lipids Lab Results  Component Value Date   CHOL 186 05/08/2014   HDL 52.30 05/08/2014   LDLCALC 104* 08/04/2011   TRIG 273.0* 05/08/2014   BNP PRO B NATRIURETIC PEPTIDE (BNP)  Date/Time Value Ref Range Status  05/08/2014 01:45 PM 27.0 0.0 - 100.0 pg/mL Final  12/23/2012 05:29 PM 9.0 0.0 - 100.0 pg/mL Final   Thyroid Function Tests: No results for input(s): TSH, T4TOTAL, T3FREE, THYROIDAB in the last 72 hours.  Invalid input(s): FREET3 Miscellaneous Lab Results  Component Value Date   DDIMER 0.46 12/23/2011    Radiology/Studies:  Dg Chest 2 View  01/15/2015  CLINICAL DATA:  59 year old female with shortness breath and weakness, with tachycardia. Cough and sinus infection. EXAM: CHEST  2 VIEW COMPARISON:  Chest x-ray 12/28/2012. FINDINGS: Lung volumes are normal. No consolidative airspace disease. No pleural effusions. No pneumothorax. No pulmonary nodule or mass noted. Pulmonary vasculature and the cardiomediastinal silhouette are within normal limits. Numerous surgical clips project over the upper abdomen bilaterally. IMPRESSION: No radiographic evidence of acute cardiopulmonary disease. Electronically Signed   By: Vinnie Langton M.D.   On: 01/15/2015 18:59    BDH:DIXBO at 82   Assessment and Plan:  Sinus tachycardia  Fatigue  Dyspnea on exertion     Not sure that i understand the cause of her symptoms. Her HR may be sinus or atrial tach,  We dont have ECG of rapid rates to look at P wave morphology.  Would use holter monitor to see if we can look at transitions to clarify There may be  a dysautonomic component, but her resting HR on many measured occasions are normal and today without orthostatic stress so does not fit POTS or Inappropriate sinus tach.   Still heat intolerance orthostatic intolerance and tach are suggestive  Her exercise intolerance may be related to excessive HR response. Betablockers have been a concern in the past because of low BP but may be worth trying  ivabradine might work, but would not liely be convered by inusrance.  We will start with GXT and then consider CPX  I think further thought on her abnormal CT is in order and while not the whole story i guess does it point to a unifying process  I have also suggested taht evaluation at some place like [facility] may be valuable where all the consultants are engaged with a PCP organizer  In the interim have suggested salt and water rrepeletion      Virl Axe

## 2015-01-31 NOTE — Progress Notes (Signed)
Patient ID: Claudia Luna, female   DOB: [birth year], 59 y.o.   MRN: [id number]   HPI  Claudia Luna is a 59 y.o.-year-old female, referred by her PCP, Shirline Freesory Nafziger, NP, for management of hypothyroidism, but patient would want me to address her fatigue and heat intolerance. She is here with her husband who offers part of the history She saw Dr Altheimer in 2003-2004.   Pt. has been dx with hypothyroidism in 1992; previously on Synthroid, now recently is on Levothyroxine 125 mcg in the last 2-3 months, taken: - fasting - with water - separated by >30 min from b'fast  - no calcium, iron, PPIs, multivitamins   I reviewed pt's thyroid tests: Lab Results  Component Value Date   TSH 0.505 01/15/2015   TSH 9.87* 10/03/2014   TSH 5.38* 05/08/2014   TSH 0.22* 12/23/2012   TSH 0.11* 11/16/2012   TSH 1.37 08/04/2011   TSH 1.06 04/04/2009   TSH 0.75 06/27/2008   TSH 2.02 03/15/2008   TSH 0.73 04/20/2006   FREET4 0.68 05/08/2014   FREET4 1.10 11/16/2012    Pt describes: - + heat intolerance - this is permanent, and does not feel like hot flashes. Not associated with sweating. She feels that she could wear shorts and T-shirts throughout the year. She was on by mouth estrogen before and this helped some, but she continued to have heat intolerance at that time, also. - + extreme fatigue  - this started around 2012, but she cannot pinpoint to possible cause. She gets very fatigued when she takes a shower or when she takes a trip to the grocery store and has to come home and sleep. She sleeps mostly during the day but has insomnia at night. She has a history of fibromyalgia and is on Opioid with Tylenol combination. She cannot miss doses.  - + weight gain - + anxiety, no depression - + constipation - no dry skin - no hair loss - + Headaches, for which she takes Maxalt once a month  Pt denies feeling nodules in neck, hoarseness, dysphagia/odynophagia, SOB with lying down.  She was  on Prednisone 2 years ago >> gained 25 lbs. after she stopped prednisone, she continued to gain weight.  She had a hysterectomy in 1986, BSO in 1987 - was on Estrogen for a long time, then on E patch 4-5 years ago >> tapered down.   I reviewed her chart and she also has a history of HL, GERD, vitamin D def., fibromyalgia- dx 1992.  ROS: Constitutional: + weight gain, + fatigue, + hot flushes, + poor sleep, + nocturia Eyes: + blurry vision, no xerophthalmia ENT: no sore throat, no nodules palpated in throat, no dysphagia/odynophagia, no hoarseness, + tinnitus Cardiovascular: no CP/+ SOB/+ palpitations/+ leg swelling Respiratory: +cough/+ SOB Gastrointestinal: no N/V/D/+ C, + heartburn Musculoskeletal: + muscle aches/+ joint aches Skin: no rashes, + itching, + easy bruising, + hair loss Neurological: + tremors/no numbness/tingling/dizziness, + HA Psychiatric: no depression/+ anxiety + low libido  Past Medical History  Diagnosis Date  . Thyroid disease     hypothyroid  . Anxiety   . Depression   . Asthma   . Hyperlipidemia   . Headache(784.0)   . Chronic kidney disease     kidney stones, history of  . Fibromyalgia   . Gout    Past Surgical History  Procedure Laterality Date  . Breast surgery  1993    reduction  . Abdominal hysterectomy    . Knee  arthroscopy      total knee  . Appendectomy    . Cholecystectomy    . Splenic artery embolization     Social History   Social History  . Marital Status: Married    Spouse Name: Merlyn Albert  . Number of Children: 4   Occupational History  . Previous Freight forwarder Now housewife   Social History Main Topics  . Smoking status: Never Smoker   . Smokeless tobacco: Never Used  . Alcohol Use: 0.0 oz/week    0 Standard drinks or equivalent per week       . Drug Use: No   Current Outpatient Prescriptions on File Prior to Visit  Medication Sig Dispense Refill  . benzonatate (TESSALON) 100 MG capsule Take 1 capsule (100  mg total) by mouth every 8 (eight) hours. 21 capsule 0  . bisacodyl (DULCOLAX) 5 MG EC tablet Take 10 mg by mouth at bedtime. Pt takes 2 tabs every other night    . estradiol (MINIVELLE) 0.05 MG/24HR patch Place 1 patch (0.05 mg total) onto the skin 2 (two) times a week. On Monday/Thursday 24 patch 3  . furosemide (LASIX) 20 MG tablet Take 20 mg by mouth once a week. PATIENT TAKE ONETABLET 20 MG BY MOUTH BY ONCE WEEKLY AS NEEDED FOR FLUID BUILDUP    . gabapentin (NEURONTIN) 600 MG tablet Take 600 mg by mouth 3 (three) times daily.    Marland Kitchen ibuprofen (ADVIL,MOTRIN) 200 MG tablet 4 tabs three times daily as needed for joint pain - take with food    . levofloxacin (LEVAQUIN) 500 MG tablet Take 500 mg by mouth daily.    Marland Kitchen levothyroxine (SYNTHROID, LEVOTHROID) 125 MCG tablet Take 1 tablet (125 mcg total) by mouth daily. 90 tablet 3  . oxyCODONE-acetaminophen (PERCOCET) 7.5-325 MG tablet Take 1 tablet by mouth every 6 (six) hours as needed. Pain.  0  . PARoxetine (PAXIL) 20 MG tablet Take 1 tablet (20 mg total) by mouth every morning. (Patient taking differently: Take 20 mg by mouth at bedtime. ) 90 tablet 3  . rizatriptan (MAXALT) 10 MG tablet Take 1 tablet (10 mg total) by mouth daily as needed. For headache 10 tablet 3  . rosuvastatin (CRESTOR) 20 MG tablet Take 1 tablet (20 mg total) by mouth daily. 90 tablet 3  . valACYclovir (VALTREX) 1000 MG tablet TAKE 1 TABLET BY MOUTH DAILY AS DIRECTED WHEN NEEDED FOR COLD SORES 30 tablet 0  . Vitamin D, Ergocalciferol, (DRISDOL) 50000 UNITS CAPS capsule Take 1 capsule (50,000 Units total) by mouth every 7 (seven) days. 12 capsule 3   No current facility-administered medications on file prior to visit.   Allergies  Allergen Reactions  . Codeine Nausea And Vomiting  . Penicillins     REACTION: RASH  . Pregabalin Other (See Comments)    Edema, weight gain   Family History  Problem Relation Age of Onset  . Breast cancer Mother     breast  . Stroke Mother      "mini"  . Coronary artery disease Father     status post CABG  . Peripheral vascular disease Father   . Hypertension Father   . Hyperlipidemia Father   . Stroke Father     multiple  . Hypertension Sister   . High Cholesterol Sister   . GER disease Sister    PE: BP 100/70 mmHg  Pulse 91  Temp(Src) 98.4 F (36.9 C)  Wt 161 lb (73.029 kg) Body mass index is  30.44 kg/(m^2). Wt Readings from Last 3 Encounters:  01/31/15 161 lb (73.029 kg)  01/18/15 161 lb 12.8 oz (73.392 kg)  01/15/15 161 lb (73.029 kg)   Constitutional: overweight, in NAD, full supraclavicular fat pad Eyes: PERRLA, EOMI, no exophthalmos ENT: moist mucous membranes, no thyromegaly, no cervical lymphadenopathy Cardiovascular: RRR, No MRG Respiratory: CTA B Gastrointestinal: abdomen soft, NT, ND, BS+ Musculoskeletal: no deformities, strength intact in all 4 Skin: moist, warm, no rashes Neurological: + tremor of head and with outstretched hands, DTR normal in all 4  ASSESSMENT: 1. Hypothyroidism  2. Fatigue  3. Heat intolerance  PLAN:  1. Patient with long-standing hypothyroidism, on levothyroxine therapy. She appears euthyroid, but has multiple symptoms that could be attributed to hypothyroidism: Weight gain, fatigue, daytime somnolence, constipation. She does not appear to have a goiter, thyroid nodules, or neck compression symptoms. - We discussed about correct intake of levothyroxine, fasting, with water, separated by at least 30 minutes from breakfast, and separated by more than 4 hours from calcium, iron, multivitamins, acid reflux medications (PPIs). She is taking it correctly. - We reviewed together her recent TFTs, which were normal, but were previously fluctuating. She is now on generic levothyroxine 125 g daily, which we will continue, but I advised her to let me know whether she wants to switch back to brand name Synthroid in the future - She mentions that her hypothyroidism is not really the  reason why she came to see me, although this was stated in the right referral message. Since her TFTs are controlled, she may continue to see her PCP for this and return as needed if problems with TFTs arise in the future.  2. Extreme fatigue  - she mentions that this is the real reason why she came to see me and would like me to check her cortisol, ACTH, pituitary hormones, and other endocrine tests that may help her elucidate why she is feeling so tired and so hot all the time - Patient describes extreme fatigue that started around 2012 and in her past states her the value the house. She describes feeling tired even when she takes a shower. - She has had tests for vitamin D deficiency in the past, and this has been replaced - She does not remember having tested for vitamin B12, and we'll do this today - She would like me to test her for adrenal insufficiency and I advised her to come back for a cosyntropin stimulation test, which was ordered today - Since she has weight gain along with the fatigue, I will also check her for Cushing's syndrome and I gave her instructions for 24-hour urine collection for cortisol - Aside from cortisol and thyroid tests, no other pituitary hormone deficiency produces extreme fatigue, and also cannot check her for [facility] and [facility], since she is on estrogen - We discussed about potential MTHFR mutations, but I am not sure how to test for this and she may need to check with her PCP. If she has MTHFR heterozygosity or homozygosity, she would benefit from methylated vitamins - We also discussed about potential Lyme disease testing  3. Heat intolerance - she has a h/o early surgical menopause, on estradiol tx (previously on E2 po and now on E2 patch. She tells me her heat intolerance was better when she was on by mouth estradiol, but not resolved. - We discussed that there are few endocrine disorders that cause heat intolerance:  DM with hypoglycemia - no h/o DM, and the heat  intolerance is permanent  Hyperthyroidism - recent TFTs normal  Pheochromocytoma - no high BP episodes, but has HAs, for which she takes Maxalt approximately once a month - will check plasma metanephrines and catecholamines  Acromegaly - no increase in size of fingers and show size - usually sweating not heat intolerance  Cushing sd. - she does not have other sxs other than her weight gain and fatigue, but will check a 24-hour urine cortisol  Carcinoid - doubt this because no clear flushing and no diarrhea or wheezing, however will check 24-hour urine 5-HIAA. Patient was given a list of medications and foods to avoid during the collection. One of the medicines that should be avoided his Tylenol, however, for her, this is part of her fibromyalgia treatment and she cannot miss doses. We discussed that if the 5 HIAA is higher, we cannot really interpret that since she is on Tylenol, however, if it is normal, this rules out carcinoid.  Medullary thyroid cancer - I doubt this, but will check a calcitonin level  Systemic mastocytosis - no rash, but will check a tryptase If the above investigation is negative, we discussed that she may need to increase her dose of estrogen at least to help manage her heat intolerance.  - time spent with the patient: 1 hour, of which >50% was spent in obtaining information about her symptoms, reviewing her previous labs, evaluations, and treatments, counseling her about her condition (please see the discussed topics above), and developing a plan to further investigate it; she and her husband had a number of questions which I addressed.  Component     Latest Ref Rng 01/31/2015  Epinephrine      REPORT  Norepinephrine      204  Dopamine      REPORT  Catecholamines, Total      204  Metanephrine, Free     <=57 pg/mL <25  Normetanephrine, Free     <=148 pg/mL 51  Total Metanephrines-Plasma     <=205 pg/mL 51  Vitamin B-12     211 - 911 pg/mL 318  Ferritin      10.0 - 291.0 ng/mL 84.4  Tryptase     <11 ug/L 8.3  Calcitonin     <=5 pg/mL <2   No signs of pheochromocytoma, B12 def., anemia, systemic mastocytosis, Medullary ThyCA. The rest of the labs are pending. I will addend the results when they become available.

## 2015-02-01 ENCOUNTER — Other Ambulatory Visit (HOSPITAL_COMMUNITY): Payer: Managed Care, Other (non HMO)

## 2015-02-01 ENCOUNTER — Ambulatory Visit (HOSPITAL_COMMUNITY): Payer: Managed Care, Other (non HMO)

## 2015-02-02 ENCOUNTER — Ambulatory Visit (HOSPITAL_COMMUNITY): Payer: Managed Care, Other (non HMO)

## 2015-02-02 ENCOUNTER — Other Ambulatory Visit: Payer: Managed Care, Other (non HMO)

## 2015-02-02 ENCOUNTER — Other Ambulatory Visit (HOSPITAL_COMMUNITY): Payer: Managed Care, Other (non HMO)

## 2015-02-02 LAB — CALCITONIN: Calcitonin: <2 pg/mL (ref <=5)

## 2015-02-02 LAB — TRYPTASE: Tryptase: 8.3 ug/L (ref <11)

## 2015-02-05 LAB — METANEPHRINES, PLASMA
Metanephrine, Free: <25 pg/mL (ref <=57)
Normetanephrine, Free: 51 pg/mL (ref <=148)
Total Metanephrines-Plasma: 51 pg/mL (ref <=205)

## 2015-02-06 ENCOUNTER — Encounter: Payer: Self-pay | Admitting: *Deleted

## 2015-02-06 LAB — CATECHOLAMINES, FRACTIONATED, PLASMA
Catecholamines, Total: 204 pg/mL
Norepinephrine: 204 pg/mL

## 2015-02-06 NOTE — Progress Notes (Signed)
Patient ID: Claudia Luna, female   DOB: 03/02/1955, 60 y.o.   MRN: [id number] Patient did not show up for 02/06/2015, 2:30 PM, appointment to have a 48 hour holter monitor applied.

## 2015-03-16 ENCOUNTER — Encounter: Payer: Managed Care, Other (non HMO) | Admitting: Internal Medicine

## 2015-09-02 ENCOUNTER — Other Ambulatory Visit: Payer: Self-pay | Admitting: Family Medicine

## 2015-09-02 DIAGNOSIS — G43909 Migraine, unspecified, not intractable, without status migrainosus: Secondary | ICD-10-CM

## 2015-10-03 ENCOUNTER — Other Ambulatory Visit: Payer: Self-pay | Admitting: Family Medicine

## 2015-10-03 DIAGNOSIS — E785 Hyperlipidemia, unspecified: Secondary | ICD-10-CM

## 2015-10-05 NOTE — Telephone Encounter (Signed)
She can have 90 days on everything. Please call her and inform her that she needs to come in and be seen for her physical. No additional refills until seen

## 2015-10-05 NOTE — Telephone Encounter (Signed)
Patient notified prescriptions sent in.

## 2015-11-07 ENCOUNTER — Ambulatory Visit: Payer: Managed Care, Other (non HMO) | Admitting: Adult Health

## 2015-11-08 ENCOUNTER — Encounter: Payer: Self-pay | Admitting: Adult Health

## 2015-11-08 ENCOUNTER — Ambulatory Visit (INDEPENDENT_AMBULATORY_CARE_PROVIDER_SITE_OTHER): Payer: Managed Care, Other (non HMO) | Admitting: Adult Health

## 2015-11-08 VITALS — BP 120/80 | HR 93 | Temp 98.2°F | Resp 20 | Ht 61.0 in | Wt 161.4 lb

## 2015-11-08 DIAGNOSIS — Z23 Encounter for immunization: Secondary | ICD-10-CM

## 2015-11-08 DIAGNOSIS — F329 Major depressive disorder, single episode, unspecified: Secondary | ICD-10-CM

## 2015-11-08 DIAGNOSIS — E785 Hyperlipidemia, unspecified: Secondary | ICD-10-CM

## 2015-11-08 DIAGNOSIS — F32A Depression, unspecified: Secondary | ICD-10-CM

## 2015-11-08 DIAGNOSIS — G43909 Migraine, unspecified, not intractable, without status migrainosus: Secondary | ICD-10-CM | POA: Diagnosis not present

## 2015-11-08 DIAGNOSIS — Z760 Encounter for issue of repeat prescription: Secondary | ICD-10-CM

## 2015-11-08 DIAGNOSIS — E039 Hypothyroidism, unspecified: Secondary | ICD-10-CM | POA: Diagnosis not present

## 2015-11-08 MED ORDER — OMEPRAZOLE 20 MG PO CPDR: 20.0000 mg | DELAYED_RELEASE_CAPSULE | Freq: Every day | ORAL | 0 refills | Status: DC

## 2015-11-08 MED ORDER — ROSUVASTATIN CALCIUM 20 MG PO TABS: ORAL_TABLET | ORAL | 0 refills | Status: DC

## 2015-11-08 MED ORDER — CHLORPHENIRAMINE MALEATE 4 MG PO TABS: 4.0000 mg | ORAL_TABLET | Freq: Every day | ORAL | 0 refills | Status: DC

## 2015-11-08 MED ORDER — VITAMIN D (ERGOCALCIFEROL) 1.25 MG (50000 UNIT) PO CAPS: ORAL_CAPSULE | ORAL | 0 refills | Status: DC

## 2015-11-08 MED ORDER — LEVOTHYROXINE SODIUM 125 MCG PO TABS: ORAL_TABLET | ORAL | 0 refills | Status: DC

## 2015-11-08 MED ORDER — PAROXETINE HCL 20 MG PO TABS: ORAL_TABLET | ORAL | 0 refills | Status: DC

## 2015-11-08 MED ORDER — RIZATRIPTAN BENZOATE 10 MG PO TABS: ORAL_TABLET | ORAL | 0 refills | Status: DC

## 2015-11-08 NOTE — Progress Notes (Signed)
Subjective:    Patient ID: Claudia Luna, female    DOB: 06-06-1955, 60 y.o.   MRN: [id number]  HPI  60 year old female who  has a past medical history of Anxiety; Asthma; Chronic kidney disease; Depression; Fibromyalgia; Gout; Headache(784.0); Hyperlipidemia; and Thyroid disease. She presents to the office today for medication refill.   She will be establishing with Dr. [place] at the end of the month but she is out of a lot of her medications.   She reports that she is feeling good and has no complaints. She feels as though her depression and anxiety is well controlled.   She denies any chest pain or SOB  Review of Systems  Constitutional: Negative.   Respiratory: Negative.   Cardiovascular: Negative.   Gastrointestinal: Negative.   Neurological: Negative.   Psychiatric/Behavioral: Negative.   All other systems reviewed and are negative.   BP 120/80 (BP Location: Left Arm, Patient Position: Sitting, Cuff Size: Normal)   Pulse 93   Temp 98.2 F (36.8 C) (Oral)   Resp 20   Ht 5\' 1"  (1.549 m)   Wt 161 lb 6.1 oz (73.2 kg)   SpO2 95%   BMI 30.49 kg/m       Objective:   Physical Exam  Constitutional: She is oriented to person, place, and time. She appears well-developed and well-nourished. No distress.  Cardiovascular: Normal rate, regular rhythm, normal heart sounds and intact distal pulses.  Exam reveals no gallop and no friction rub.   No murmur heard. Pulmonary/Chest: Effort normal and breath sounds normal. No respiratory distress. She has no wheezes. She has no rales. She exhibits no tenderness.  Musculoskeletal: Normal range of motion. She exhibits no edema, tenderness or deformity.  Neurological: She is alert and oriented to person, place, and time.  Skin: Skin is warm and dry. No rash noted. She is not diaphoretic. No erythema. No pallor.  Psychiatric: She has a normal mood and affect. Her behavior is normal. Judgment and thought content normal.  Nursing  note and vitals reviewed.     Assessment & Plan:   1. Hypothyroidism, unspecified type  - levothyroxine (SYNTHROID, LEVOTHROID) 125 MCG tablet; TAKE 1 TABLET(125 MCG) BY MOUTH DAILY  Dispense: 90 tablet; Refill: 0  2. Medication refill  - RESTASIS 0.05 % ophthalmic emulsion; INSTILL 1 GTT INTO OU BID; Refill: 2 - omeprazole (PRILOSEC) 20 MG capsule; Take 1 capsule (20 mg total) by mouth daily.  Dispense: 90 capsule; Refill: 0 - levothyroxine (SYNTHROID, LEVOTHROID) 125 MCG tablet; TAKE 1 TABLET(125 MCG) BY MOUTH DAILY  Dispense: 90 tablet; Refill: 0 - PARoxetine (PAXIL) 20 MG tablet; TAKE 1 TABLET(20 MG) BY MOUTH EVERY MORNING  Dispense: 90 tablet; Refill: 0 - rizatriptan (MAXALT) 10 MG tablet; TAKE 1 TABLET(10 MG) BY MOUTH DAILY AS NEEDED FOR HEADACHE  Dispense: 10 tablet; Refill: 0 - rosuvastatin (CRESTOR) 20 MG tablet; TAKE 1 TABLET(20 MG) BY MOUTH DAILY  Dispense: 90 tablet; Refill: 0 - Vitamin D, Ergocalciferol, (DRISDOL) 50000 units CAPS capsule; TAKE 1 CAPSULE BY MOUTH EVERY 7 DAYS  Dispense: 12 capsule; Refill: 0 - chlorpheniramine (CHLOR-TRIMETON) 4 MG tablet; Take 1 tablet (4 mg total) by mouth at bedtime.  Dispense: 90 tablet; Refill: 0  3. Encounter for immunization - Flu Vaccine QUAD 36+ mos IM  4. Migraine without status migrainosus, not intractable, unspecified migraine type  - rizatriptan (MAXALT) 10 MG tablet; TAKE 1 TABLET(10 MG) BY MOUTH DAILY AS NEEDED FOR HEADACHE  Dispense: 10 tablet; Refill:  0  5. Hyperlipidemia, unspecified hyperlipidemia type  - rosuvastatin (CRESTOR) 20 MG tablet; TAKE 1 TABLET(20 MG) BY MOUTH DAILY  Dispense: 90 tablet; Refill: 0  6. Depression, unspecified depression type  - PARoxetine (PAXIL) 20 MG tablet; TAKE 1 TABLET(20 MG) BY MOUTH EVERY MORNING  Dispense: 90 tablet; Refill: 0  Shirline Frees, NP

## 2015-11-20 ENCOUNTER — Ambulatory Visit: Payer: Managed Care, Other (non HMO) | Admitting: Family Medicine

## 2015-12-06 ENCOUNTER — Ambulatory Visit (INDEPENDENT_AMBULATORY_CARE_PROVIDER_SITE_OTHER): Payer: Managed Care, Other (non HMO) | Admitting: Family Medicine

## 2015-12-06 ENCOUNTER — Encounter: Payer: Self-pay | Admitting: Family Medicine

## 2015-12-06 ENCOUNTER — Other Ambulatory Visit: Payer: Self-pay | Admitting: Adult Health

## 2015-12-06 VITALS — BP 114/80 | HR 88 | Temp 98.4°F | Resp 12 | Ht 61.0 in

## 2015-12-06 DIAGNOSIS — G43909 Migraine, unspecified, not intractable, without status migrainosus: Secondary | ICD-10-CM

## 2015-12-06 DIAGNOSIS — G43009 Migraine without aura, not intractable, without status migrainosus: Secondary | ICD-10-CM

## 2015-12-06 DIAGNOSIS — H8110 Benign paroxysmal vertigo, unspecified ear: Secondary | ICD-10-CM

## 2015-12-06 DIAGNOSIS — R110 Nausea: Secondary | ICD-10-CM | POA: Diagnosis not present

## 2015-12-06 DIAGNOSIS — Z760 Encounter for issue of repeat prescription: Secondary | ICD-10-CM

## 2015-12-06 MED ORDER — ONDANSETRON HCL 4 MG PO TABS: 4.0000 mg | ORAL_TABLET | Freq: Three times a day (TID) | ORAL | 0 refills | Status: AC | PRN

## 2015-12-06 MED ORDER — ONDANSETRON HCL 4 MG/2ML IJ SOLN
4.0000 mg | Freq: Once | INTRAMUSCULAR | Status: AC
  Administered 2015-12-06: 4 mg via INTRAMUSCULAR

## 2015-12-06 MED ORDER — MECLIZINE HCL 25 MG PO TABS: 25.0000 mg | ORAL_TABLET | Freq: Three times a day (TID) | ORAL | 0 refills | Status: AC | PRN

## 2015-12-06 MED ORDER — KETOROLAC TROMETHAMINE 60 MG/2ML IM SOLN
60.0000 mg | Freq: Once | INTRAMUSCULAR | Status: AC
  Administered 2015-12-06: 60 mg via INTRAMUSCULAR

## 2015-12-06 NOTE — Patient Instructions (Signed)
  [name] I have seen you today for an acute visit.  1. Benign paroxysmal positional vertigo, unspecified laterality  - Basic metabolic panel - CBC w/Diff  2. Migraine without aura and without status migrainosus, not intractable  - Basic metabolic panel - CBC w/Diff  3. Nausea without vomiting  - ondansetron (ZOFRAN) 4 MG tablet; Take 1 tablet (4 mg total) by mouth every 8 (eight) hours as needed for nausea or vomiting.  Dispense: 15 tablet; Refill: 0  Decrease frequency of Percocet.  Dizziness is a perception of movement, it is sometimes difficult to describe and can be  caused by different problems, most benign but others can be life threaten.  Vertigo is the most common cause of dizziness, usually related with inner ear and can be associated with nausea, vomiting, and unbalance sensation. It can be complicated by falls due to lose of balance; so fall precautions are very important.  Most of the time dizziness is benign, usually intermittent, last a few seconds at the time and aggravated by certain positions. It usually resolves in a few weeks without residual effect but it could be recurrent.  Sometimes blood work is ordered to evaluate for other possible causes.  Dizziness can also be caused by certain medications, dehydration, migraines, and strokes.  Medication prescribed for vertigo, Meclizine, causes drowsiness/sleepiness, so frequently I recommended taking it at bedtime. I also recommend what we called vestibular exercise, sometimes can be done at home (Modified Semont maneuvers) other times I refer patients to vestibular rehabilitation.   Seek immediate medical attention if: New severe headache, dobble vision, fever (100 F or more), associated numbness/tingling, focal weakness, persistent vomiting, not able to walk, or sudden worsening symptoms.   In general please monitor for signs of worsening symptoms and seek immediate medical attention if any  concerning/warning symptom as we discussed.    Please be sure you have an appointment already scheduled with your PCP before you leave today.

## 2015-12-06 NOTE — Progress Notes (Signed)
HPI:  ACUTE VISIT:  Chief Complaint  Patient presents with  . Dizziness    Claudia Luna is a 60 y.o. female, who is here today with her husband complaining of "spinning" sensation that is started on 11/30/2015 when she woke up. "Not feeling well", spinning sensation is exacerbated by turning in bed or head movement, alleviated by being still for a few seconds. On 12/01/2015 she was diagnosed with a sinus infection and ear infection, currently she is on Augmentin (2 days of treatment left)  and she has noted improvement overall, she still has mild left earache. She denies any hearing loss. Dizziness pills are not associated with nausea, she denies vomiting. She denies any history of vertigo. She has not tried any OTC treatment. Overall she is reporting improvement of dizziness, about 40%.    Headache: She is also complaining of 3 weeks of right hemi cranial headache, stabbing-like pain, sometimes throbbing and aching. She denies associated visual changes. +Photophobia. Headache is about 6/10. She denies any numbness or tingling, focal deficit, MS changes. Today she took Maxalt. + Under stress.  She has history of migraines and has followed with neurologist in the past, last visit about a year ago; migraine-headache was getting better. She states that the headache she had yesterday was not her typical migraine, which is usually worse. Fatigue, more than usual. History of fibromyalgia, currently she is on OxyContin 10 mg twice daily and Percocet 7.5-325 mg every 8 hours prn.  She also mentions "shaking" all over, she denies LOC, urine/bowel incontinence, or confusion. She is currently on Paxil and takes Chlorpheniramine.    Lab Results  Component Value Date   WBC 9.5 01/15/2015   HGB 15.2 (H) 01/15/2015   HCT 44.8 01/15/2015   MCV 90.5 01/15/2015   PLT 320 01/15/2015   Lab Results  Component Value Date   CREATININE 0.73 01/15/2015   BUN 22 (H)  01/15/2015   NA 140 01/15/2015   K 3.5 01/15/2015   CL 107 01/15/2015   CO2 23 01/15/2015   Lab Results  Component Value Date   TSH 0.505 01/15/2015     Review of Systems  Constitutional: Positive for fatigue. Negative for activity change, appetite change, fever and unexpected weight change.  HENT: Negative for congestion, ear discharge, ear pain, facial swelling, hearing loss, mouth sores, nosebleeds, rhinorrhea, sneezing, sore throat, trouble swallowing and voice change.   Eyes: Positive for photophobia. Negative for pain and visual disturbance.  Respiratory: Negative for cough, shortness of breath and wheezing.   Cardiovascular: Negative for chest pain, palpitations and leg swelling.  Gastrointestinal: Positive for nausea. Negative for abdominal pain and vomiting.       No changes in bowel habits.  Endocrine: Negative for polydipsia and polyphagia.  Musculoskeletal: Negative for gait problem and myalgias.  Skin: Negative for color change and rash.  Allergic/Immunologic: Negative for environmental allergies.  Neurological: Positive for dizziness and headaches. Negative for tremors, syncope, speech difficulty, weakness and numbness.  Hematological: Negative for adenopathy. Does not bruise/bleed easily.  Psychiatric/Behavioral: Negative for confusion and sleep disturbance. The patient is not nervous/anxious.       Current Outpatient Prescriptions on File Prior to Visit  Medication Sig Dispense Refill  . chlorpheniramine (CHLOR-TRIMETON) 4 MG tablet Take 1 tablet (4 mg total) by mouth at bedtime. 90 tablet 0  . ibuprofen (ADVIL,MOTRIN) 200 MG tablet 4 tabs three times daily as needed for joint pain - take with food    .  levothyroxine (SYNTHROID, LEVOTHROID) 125 MCG tablet TAKE 1 TABLET(125 MCG) BY MOUTH DAILY 90 tablet 0  . omeprazole (PRILOSEC) 20 MG capsule Take 1 capsule (20 mg total) by mouth daily. 90 capsule 0  . oxyCODONE-acetaminophen (PERCOCET) 7.5-325 MG tablet Take 1  tablet by mouth every 8 (eight) hours as needed. Pain.   0  . OXYCONTIN 10 MG 12 hr tablet Take 10 mg by mouth 2 (two) times daily.     Marland Kitchen. PARoxetine (PAXIL) 20 MG tablet TAKE 1 TABLET(20 MG) BY MOUTH EVERY MORNING 90 tablet 0  . RESTASIS 0.05 % ophthalmic emulsion INSTILL 1 GTT INTO OU BID  2  . rosuvastatin (CRESTOR) 20 MG tablet TAKE 1 TABLET(20 MG) BY MOUTH DAILY 90 tablet 0  . valACYclovir (VALTREX) 1000 MG tablet TAKE 1 TABLET BY MOUTH DAILY AS DIRECTED WHEN NEEDED FOR COLD SORES 30 tablet 0  . Vitamin D, Ergocalciferol, (DRISDOL) 50000 units CAPS capsule TAKE 1 CAPSULE BY MOUTH EVERY 7 DAYS 12 capsule 0   No current facility-administered medications on file prior to visit.      Past Medical History:  Diagnosis Date  . Anxiety   . Asthma   . Chronic kidney disease    kidney stones, history of  . Depression   . Fibromyalgia   . Gout   . Headache(784.0)   . Hyperlipidemia   . Thyroid disease    hypothyroid   Allergies  Allergen Reactions  . Codeine Nausea And Vomiting  . Penicillins       . Pregabalin Other (See Comments)    Edema, weight gain    Social History   Social History  . Marital status: Married    Spouse name: Merlyn AlbertFred  . Number of children: 4  . Years of education: N/A   Occupational History  . Freight forwarderxecutive Senior Secretary    Social History Main Topics  . Smoking status: Never Smoker  . Smokeless tobacco: Never Used  . Alcohol use 0.0 oz/week     Comment: 2-4 drinks/year  . Drug use: No  . Sexual activity: Yes   Other Topics Concern  . None   Social History Narrative   Regular exercise: yes    Vitals:   12/06/15 1412  BP: 114/80  Pulse: 88  Resp: 12  Temp: 98.4 F (36.9 C)   There is no height or weight on file to calculate BMI.   Physical Exam  Nursing note and vitals reviewed. Constitutional: She is oriented to person, place, and time. She appears well-developed. She does not appear ill. No distress.  HENT:  Head: Atraumatic.    Right Ear: Hearing, tympanic membrane, external ear and ear canal normal.  Left Ear: Hearing, tympanic membrane, external ear and ear canal normal.  Mouth/Throat: Oropharynx is clear and moist and mucous membranes are normal.  Apley maneuver: Did not tolerate maneuver. Spinning sensation exacerbated by tilting head while sitting on exam table.    Eyes: Conjunctivae are normal. Pupils are equal, round, and reactive to light.  Fundoscopic exam:      The right eye shows no hemorrhage and no papilledema.       The left eye shows no hemorrhage and no papilledema.  Neck: Carotid bruit is not present. No tracheal deviation present. No thyromegaly present.  Cardiovascular: Normal rate and regular rhythm.   No murmur heard. Pulses:      Dorsalis pedis pulses are 2+ on the right side, and 2+ on the left side.  Respiratory: Effort normal and  breath sounds normal. No respiratory distress.  GI: Soft. There is no tenderness.  Musculoskeletal: She exhibits no edema or tenderness.  Lymphadenopathy:    She has no cervical adenopathy.  Neurological: She is alert and oriented to person, place, and time. She has normal strength. No cranial nerve deficit or sensory deficit. She displays a negative Romberg sign. Coordination and gait normal.  Reflex Scores:      Bicep reflexes are 2+ on the right side and 2+ on the left side.      Patellar reflexes are 2+ on the right side and 2+ on the left side. Pronator drift negative. I do not appreciate tremors.  Skin: Skin is warm. No cyanosis or erythema.  Psychiatric: Her speech is normal. Her mood appears anxious. Cognition and memory are normal.  Well groomed, good eye contact.      ASSESSMENT AND PLAN:     Zeanna was seen today for dizziness.  Diagnoses and all orders for this visit:  Migraine without aura and without status migrainosus, not intractable   Discussed possible causes of headache, including tension-like headache and migraines. Also  chronic opioid treatment came aggravate headaches as well as nausea, recommended decreasing frequency of Percocet. Reviewing some records, 04/20/12: headache description is similar to the one she is describing today (right side). Botox option was given in the past (Dr Sharene Skeans).  Neurologic examination today does not suggest a acute concerning process, I don't think imaging is needed at this time but she was instructed clearly about warning signs. After verbal consent she received Toradol 60 mg IM. Further recommendations would be given according to lab results. Follow-up in 7 days, before if needed.  -     Basic metabolic panel -     CBC w/Diff -     ketorolac (TORADOL) injection 60 mg; Inject 2 mLs (60 mg total) into the muscle once.  Benign paroxysmal positional vertigo, unspecified laterality  Reporting improvement. Fall precautions. Meclizine recommended, side effects discussed.  Vestibular therapy and/or ENT evaluation might be considered depending of clinical improvement.  -     Basic metabolic panel -     CBC w/Diff -     meclizine (ANTIVERT) 25 MG tablet; Take 1 tablet (25 mg total) by mouth 3 (three) times daily as needed for dizziness.  Nausea without vomiting  Here in the office after verbal consent she received Zofran 4 mg IM, she will continue oral Zofran 4 mg 3 times per day as needed. Instructed about warning signs.  -     ondansetron (ZOFRAN) 4 MG tablet; Take 1 tablet (4 mg total) by mouth every 8 (eight) hours as needed for nausea or vomiting. -     ondansetron (ZOFRAN) injection 4 mg; Inject 2 mLs (4 mg total) into the muscle once.        Return in about 7 days (around 12/13/2015) for vertigo,headache.     -Ms.Teaira Herring Moss advised to return or notify a doctor immediately if symptoms worsen or new concerns arise.       Loni Delbridge G. [place], MD  [facility]. [place] office.

## 2015-12-07 LAB — BASIC METABOLIC PANEL
BUN: 19 mg/dL (ref 6–23)
CO2: 27 mEq/L (ref 19–32)
Calcium: 9.9 mg/dL (ref 8.4–10.5)
Chloride: 104 mEq/L (ref 96–112)
Creatinine, Ser: 0.89 mg/dL (ref 0.40–1.20)
GFR: 69.03 mL/min (ref >60.00)
Glucose, Bld: 90 mg/dL (ref 70–99)
Potassium: 4.3 mEq/L (ref 3.5–5.1)
Sodium: 140 mEq/L (ref 135–145)

## 2015-12-07 LAB — CBC WITH DIFFERENTIAL/PLATELET
Basophils Absolute: 0 10*3/uL (ref 0.0–0.1)
Basophils Relative: 0.4 % (ref 0.0–3.0)
Eosinophils Absolute: 0.2 10*3/uL (ref 0.0–0.7)
Eosinophils Relative: 2.4 % (ref 0.0–5.0)
HCT: 46.4 % — ABNORMAL HIGH (ref 36.0–46.0)
Hemoglobin: 15.8 g/dL — ABNORMAL HIGH (ref 12.0–15.0)
Lymphocytes Relative: 35.5 % (ref 12.0–46.0)
Lymphs Abs: 2.6 10*3/uL (ref 0.7–4.0)
MCHC: 34 g/dL (ref 30.0–36.0)
MCV: 88.4 fl (ref 78.0–100.0)
Monocytes Absolute: 0.5 10*3/uL (ref 0.1–1.0)
Monocytes Relative: 6.2 % (ref 3.0–12.0)
Neutro Abs: 4.1 10*3/uL (ref 1.4–7.7)
Neutrophils Relative %: 55.5 % (ref 43.0–77.0)
Platelets: 273 10*3/uL (ref 150.0–400.0)
RBC: 5.25 Mil/uL — ABNORMAL HIGH (ref 3.87–5.11)
RDW: 13.3 % (ref 11.5–15.5)
WBC: 7.4 10*3/uL (ref 4.0–10.5)

## 2015-12-07 NOTE — Telephone Encounter (Signed)
Patient is supposed to be establishing with Dr. SwazilandJordan. Can you refill this?

## 2015-12-07 NOTE — Telephone Encounter (Signed)
She can have a month supply

## 2015-12-11 ENCOUNTER — Ambulatory Visit: Payer: Managed Care, Other (non HMO) | Admitting: Family Medicine

## 2015-12-12 ENCOUNTER — Telehealth: Payer: Self-pay | Admitting: Adult Health

## 2015-12-12 NOTE — Telephone Encounter (Signed)
Pt saw dr Swazilandjordan  Last week and received abx and now has yeast infection and would like diflucan call into [facility]

## 2015-12-13 NOTE — Telephone Encounter (Signed)
Same recommendation, tried OTC and follow as needed. I do not prescribe medications upon request w/o appropriate evaluation.  [I saw Ms Claudia Luna for an acute visit because her PCP was not available, I did not prescribed Abx, and I did not address this problem during her visit, I do not think it is appropriate]  Thanks, BJ

## 2015-12-13 NOTE — Telephone Encounter (Signed)
I do not think I prescribe abx treatment, she can either requested from her PCP, for provider who prescribed abx, or she can get OTC Monistat (which is as effective as oral treatment). Rx is not authorize.  Thanks, BJ

## 2015-12-13 NOTE — Telephone Encounter (Signed)
Left voicemail for patient to call the office back.   

## 2015-12-13 NOTE — Telephone Encounter (Signed)
I called and spoke with the husband. He said she got the antibiotic over the counter, he works [employer]. I told him that the Monistat was available over the counter and that she should try that. He said that she gets them often and usually the tablet will take care of it. I told him to tell her to try the over the counter Monistat and then to let us know if that doesn't help.

## 2015-12-13 NOTE — Telephone Encounter (Signed)
See below

## 2015-12-20 ENCOUNTER — Ambulatory Visit: Payer: Managed Care, Other (non HMO) | Admitting: Adult Health

## 2016-01-07 ENCOUNTER — Other Ambulatory Visit: Payer: Self-pay | Admitting: Adult Health

## 2016-01-07 DIAGNOSIS — Z760 Encounter for issue of repeat prescription: Secondary | ICD-10-CM

## 2016-01-07 DIAGNOSIS — G43909 Migraine, unspecified, not intractable, without status migrainosus: Secondary | ICD-10-CM

## 2016-01-08 NOTE — Telephone Encounter (Signed)
Ok to refill 10 +6

## 2016-01-08 NOTE — Telephone Encounter (Signed)
Ok to refill 

## 2016-02-05 ENCOUNTER — Other Ambulatory Visit: Payer: Self-pay | Admitting: Adult Health

## 2016-02-05 DIAGNOSIS — Z760 Encounter for issue of repeat prescription: Secondary | ICD-10-CM

## 2016-02-05 DIAGNOSIS — F32A Depression, unspecified: Secondary | ICD-10-CM

## 2016-02-05 DIAGNOSIS — F329 Major depressive disorder, single episode, unspecified: Secondary | ICD-10-CM

## 2016-02-05 DIAGNOSIS — E785 Hyperlipidemia, unspecified: Secondary | ICD-10-CM

## 2016-02-05 DIAGNOSIS — E039 Hypothyroidism, unspecified: Secondary | ICD-10-CM

## 2016-02-06 NOTE — Telephone Encounter (Signed)
Ok to refill these medications? 

## 2016-02-06 NOTE — Telephone Encounter (Signed)
Please advise 

## 2016-02-06 NOTE — Telephone Encounter (Signed)
She was supposed to be establishing with Dr. SwazilandJordan in November?

## 2016-02-29 ENCOUNTER — Encounter (HOSPITAL_BASED_OUTPATIENT_CLINIC_OR_DEPARTMENT_OTHER): Payer: Self-pay

## 2016-02-29 DIAGNOSIS — G4733 Obstructive sleep apnea (adult) (pediatric): Secondary | ICD-10-CM

## 2016-03-13 ENCOUNTER — Ambulatory Visit (INDEPENDENT_AMBULATORY_CARE_PROVIDER_SITE_OTHER): Payer: Managed Care, Other (non HMO)

## 2016-03-13 ENCOUNTER — Encounter: Payer: Self-pay | Admitting: Podiatry

## 2016-03-13 ENCOUNTER — Ambulatory Visit (INDEPENDENT_AMBULATORY_CARE_PROVIDER_SITE_OTHER): Payer: Managed Care, Other (non HMO) | Admitting: Podiatry

## 2016-03-13 ENCOUNTER — Ambulatory Visit: Payer: Managed Care, Other (non HMO)

## 2016-03-13 VITALS — BP 107/76 | HR 77 | Resp 16 | Ht 61.0 in | Wt 160.0 lb

## 2016-03-13 DIAGNOSIS — G8929 Other chronic pain: Secondary | ICD-10-CM

## 2016-03-13 DIAGNOSIS — M722 Plantar fascial fibromatosis: Secondary | ICD-10-CM | POA: Diagnosis not present

## 2016-03-13 DIAGNOSIS — M779 Enthesopathy, unspecified: Secondary | ICD-10-CM | POA: Diagnosis not present

## 2016-03-13 DIAGNOSIS — M6740 Ganglion, unspecified site: Secondary | ICD-10-CM

## 2016-03-13 DIAGNOSIS — M25572 Pain in left ankle and joints of left foot: Secondary | ICD-10-CM

## 2016-03-13 DIAGNOSIS — M25571 Pain in right ankle and joints of right foot: Secondary | ICD-10-CM | POA: Diagnosis not present

## 2016-03-13 DIAGNOSIS — M2140 Flat foot [pes planus] (acquired), unspecified foot: Secondary | ICD-10-CM | POA: Diagnosis not present

## 2016-03-13 DIAGNOSIS — M76829 Posterior tibial tendinitis, unspecified leg: Secondary | ICD-10-CM

## 2016-03-13 NOTE — Progress Notes (Signed)
Subjective:    Patient ID: Claudia Luna, female    DOB: 07-12-1955, 61 y.o.   MRN: [id number]  HPI 61 year old female presents the office they for concerns of bilateral foot and ankle pain which didn't ongoing for about 5-6 years. She states that she has not had any treatment over the years for this but because of the pain to her ankle she has been pretty much non-mobile and confined to her couch because of this. Despite the pain she did not seek treatment due to other medical issues. She states the pain worsened about a year ago when [facility] [place] and she twisted her ankle on her right side. She has noticed cyst to the arch of her feet on both sides which is often been painful with limited her ambulation. She denies any numbness or tingling. She's had no recent treatment for this. No other complaints.   Review of Systems  Constitutional: Positive for fatigue.  HENT: Positive for tinnitus.   Eyes: Positive for pain.  Endocrine: Positive for heat intolerance.  Musculoskeletal: Positive for arthralgias and myalgias.  Neurological: Positive for headaches.  All other systems reviewed and are negative.      Objective:   Physical Exam General: AAO x3, NAD  Dermatological: Skin is warm, dry and supple bilateral. Nails x 10 are well manicured; remaining integument appears unremarkable at this time. There are no open sores, no preulcerative lesions, no rash or signs of infection present.  Vascular: Dorsalis Pedis artery and Posterior Tibial artery pedal pulses are 2/4 bilateral with immedate capillary fill time. There is no pain with calf compression, swelling, warmth, erythema.   Neruologic: Grossly intact via light touch bilateral. Vibratory intact via tuning fork bilateral. Protective threshold with Semmes Wienstein monofilament intact to all pedal sites bilateral. Negative Tinel's sign is present bilaterally.  Musculoskeletal: On the medial band of the plantar  fascia within the arch of the foot on the right side is a non-mobile firm soft tissue mass. 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 On the right ankle there is tenderness palpation along the medial aspect of the ankle and this is mostly along the course of posterior tibial tendon. After long discussion with her she probably told that she was treated for this and she was in a CAM boot for some time due to microtears. She never had an MRI or any other treatment for this. This appears to the majority of her symptoms are on her right side. She does not have the pain to her left ankle. MMT 5/5. Range of motion intact.  Gait: Unassisted, Nonantalgic.      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 No evidence of acute fracture identified. -At this time given her longevity of symptoms recommended MRI of the right foot and ankle to about posterior tibial tendon tear and also evaluate lesion the plantar aspect of the foot. Discussed with her injection into the lesion and she wishes to proceed with this. Under sterile conditions a mixture of Kenalog and local anesthetic was infiltrated into the right plantar fibromas any complications. Post injection care was discussed. Long-term I discussed the likely orthotics. Also discussed the possible physical therapy given her long-term immobility. -Follow-up after MRI or sooner if needed. Call any questions or concerns  in the meantime.  Ovid Curd, DPM

## 2016-03-14 MED ORDER — NONFORMULARY OR COMPOUNDED ITEM: 2 refills | Status: DC

## 2016-03-16 ENCOUNTER — Ambulatory Visit (HOSPITAL_BASED_OUTPATIENT_CLINIC_OR_DEPARTMENT_OTHER): Payer: Managed Care, Other (non HMO) | Attending: Physical Medicine and Rehabilitation | Admitting: Internal Medicine

## 2016-03-16 VITALS — Ht 61.0 in | Wt 160.0 lb

## 2016-03-16 DIAGNOSIS — G4733 Obstructive sleep apnea (adult) (pediatric): Secondary | ICD-10-CM

## 2016-03-16 DIAGNOSIS — R0683 Snoring: Secondary | ICD-10-CM | POA: Diagnosis present

## 2016-03-18 ENCOUNTER — Ambulatory Visit (INDEPENDENT_AMBULATORY_CARE_PROVIDER_SITE_OTHER): Payer: Managed Care, Other (non HMO) | Admitting: Adult Health

## 2016-03-18 VITALS — BP 98/52 | Temp 98.4°F | Wt 169.8 lb

## 2016-03-18 DIAGNOSIS — Z760 Encounter for issue of repeat prescription: Secondary | ICD-10-CM | POA: Diagnosis not present

## 2016-03-18 DIAGNOSIS — F329 Major depressive disorder, single episode, unspecified: Secondary | ICD-10-CM | POA: Diagnosis not present

## 2016-03-18 DIAGNOSIS — K219 Gastro-esophageal reflux disease without esophagitis: Secondary | ICD-10-CM | POA: Diagnosis not present

## 2016-03-18 DIAGNOSIS — F32A Depression, unspecified: Secondary | ICD-10-CM

## 2016-03-18 MED ORDER — PAROXETINE HCL 20 MG PO TABS: 20.0000 mg | ORAL_TABLET | Freq: Every morning | ORAL | 0 refills | Status: DC

## 2016-03-18 MED ORDER — OMEPRAZOLE 20 MG PO CPDR: DELAYED_RELEASE_CAPSULE | ORAL | 0 refills | Status: DC

## 2016-03-18 NOTE — Progress Notes (Signed)
Subjective:    Patient ID: Claudia Luna, female    DOB: Aug 16, 1955, 61 y.o.   MRN: [id number]  HPI  61 year old female who  has a past medical history of Anxiety; Asthma; Chronic kidney disease; Depression; Fibromyalgia; Gout; Headache(784.0); Hyperlipidemia; and Thyroid disease.   She presents to the office today for medication refills of Paxil and Omeprazole. She feels as though her depression and GERD symptoms are well controlled on these medications  Has no acute complaints.   Review of Systems See HPI  Past Medical History:  Diagnosis Date  . Anxiety   . Asthma   . Chronic kidney disease    kidney stones, history of  . Depression   . Fibromyalgia   . Gout   . Headache(784.0)   . Hyperlipidemia   . Thyroid disease    hypothyroid    Social History   Social History  . Marital status: Married    Spouse name: Merlyn AlbertFred  . Number of children: 4  . Years of education: N/A   Occupational History  . Freight forwarderxecutive Senior Secretary    Social History Main Topics  . Smoking status: Never Smoker  . Smokeless tobacco: Never Used  . Alcohol use 0.0 oz/week     Comment: 2-4 drinks/year  . Drug use: No  . Sexual activity: Yes   Other Topics Concern  . Not on file   Social History Narrative   Regular exercise: yes    Past Surgical History:  Procedure Laterality Date  . ABDOMINAL HYSTERECTOMY    . APPENDECTOMY    . BREAST SURGERY  1993   reduction  . CHOLECYSTECTOMY    . KNEE ARTHROSCOPY     total knee  . SPLENIC ARTERY EMBOLIZATION      Family History  Problem Relation Age of Onset  . Breast cancer Mother     breast  . Stroke Mother     "mini"  . Coronary artery disease Father     status post CABG  . Peripheral vascular disease Father   . Hypertension Father   . Hyperlipidemia Father   . Stroke Father     multiple  . Hypertension Sister   . High Cholesterol Sister   . GER disease Sister       Current Outpatient Prescriptions on File Prior to  Visit  Medication Sig Dispense Refill  . gabapentin (NEURONTIN) 300 MG capsule Take 300 mg by mouth daily.    Marland Kitchen. ibuprofen (ADVIL,MOTRIN) 200 MG tablet 4 tabs three times daily as needed for joint pain - take with food    . levothyroxine (SYNTHROID, LEVOTHROID) 125 MCG tablet TAKE 1 TABLET BY MOUTH EVERY DAY 30 tablet 0  . meclizine (ANTIVERT) 25 MG tablet Take 25 mg by mouth 3 (three) times daily as needed for dizziness.    . NONFORMULARY OR COMPOUNDED ITEM [facility]:  Scar Cream - Veramamil 10%, Pentoxifylline 5%, apply 1-2 grams to affected area daily. 120 each 2  . ondansetron (ZOFRAN) 4 MG tablet Take 4 mg by mouth as needed.   0  . oxyCODONE-acetaminophen (PERCOCET) 7.5-325 MG tablet Take 1 tablet by mouth every 8 (eight) hours as needed. Pain.   0  . OXYCONTIN 10 MG 12 hr tablet Take 10 mg by mouth 2 (two) times daily.     . RESTASIS 0.05 % ophthalmic emulsion INSTILL 1 GTT INTO OU BID  2  . rizatriptan (MAXALT) 10 MG tablet TAKE 1 TABLET BY MOUTH DAILY AS NEEDED  FOR HEADACHE 10 tablet 6  . rosuvastatin (CRESTOR) 20 MG tablet TAKE 1 TABLET BY MOUTH EVERY DAY 30 tablet 0  . valACYclovir (VALTREX) 1000 MG tablet TAKE 1 TABLET BY MOUTH DAILY AS DIRECTED WHEN NEEDED FOR COLD SORES 30 tablet 0  . Vitamin D, Ergocalciferol, (DRISDOL) 50000 units CAPS capsule TAKE 1 CAPSULE BY MOUTH EVERY 7 DAYS 4 capsule 0   No current facility-administered medications on file prior to visit.     BP (!) 98/52 (BP Location: Right Arm, Patient Position: Sitting, Cuff Size: Large)   Temp 98.4 F (36.9 C) (Oral)   Wt 169 lb 12.8 oz (77 kg)   BMI 32.08 kg/m       Objective:   Physical Exam  Constitutional: She is oriented to person, place, and time. She appears well-developed and well-nourished. No distress.  Cardiovascular: Normal rate, regular rhythm, normal heart sounds and intact distal pulses.  Exam reveals no gallop.   No murmur heard. Pulmonary/Chest: Effort normal and breath sounds normal.  No respiratory distress. She has no wheezes. She has no rales. She exhibits no tenderness.  Neurological: She is alert and oriented to person, place, and time.  Skin: Skin is warm and dry. No rash noted. She is not diaphoretic. No erythema. No pallor.  Psychiatric: She has a normal mood and affect. Her behavior is normal. Judgment and thought content normal.  Nursing note and vitals reviewed.     Assessment & Plan:  1. Depression, unspecified depression type  - PARoxetine (PAXIL) 20 MG tablet; Take 1 tablet (20 mg total) by mouth every morning.  Dispense: 30 tablet; Refill: 0  2. Medication refill  - PARoxetine (PAXIL) 20 MG tablet; Take 1 tablet (20 mg total) by mouth every morning.  Dispense: 30 tablet; Refill: 0 - omeprazole (PRILOSEC) 20 MG capsule; Take one tablet twice a day  Dispense: 30 capsule; Refill: 0  3. Gastroesophageal reflux disease, esophagitis presence not specified  - omeprazole (PRILOSEC) 20 MG capsule; Take one tablet twice a day  Dispense: 30 capsule; Refill: 0  Advised that she needs to follow up for her physical   Shirline Frees, NP

## 2016-03-19 ENCOUNTER — Telehealth: Payer: Self-pay | Admitting: *Deleted

## 2016-03-19 DIAGNOSIS — T148XXA Other injury of unspecified body region, initial encounter: Secondary | ICD-10-CM

## 2016-03-19 DIAGNOSIS — M722 Plantar fascial fibromatosis: Secondary | ICD-10-CM

## 2016-03-19 NOTE — Telephone Encounter (Addendum)
-----   Message from Vivi BarrackMatthew R Wagoner, DPM sent at 03/19/2016  1:57 PM EST ----- Can you order an MRI of the right foot ankle to rule out posterior tibial tendon tear and look at the plantar fibroma on the right side? Thanks. [facility], and gave to [facility] [facility] for [employer].

## 2016-03-26 ENCOUNTER — Encounter: Payer: Managed Care, Other (non HMO) | Admitting: Adult Health

## 2016-03-26 DIAGNOSIS — Z0289 Encounter for other administrative examinations: Secondary | ICD-10-CM

## 2016-03-29 DIAGNOSIS — G4733 Obstructive sleep apnea (adult) (pediatric): Secondary | ICD-10-CM | POA: Diagnosis not present

## 2016-03-29 NOTE — Procedures (Signed)
  Patient Name: Claudia Luna, Claudia Luna Study Date: 03/16/2016 Gender: Female D.O.B: [birth year] Age (years): 89 Referring Provider: Verdon CumminsMarioara Bodea Height (inches): 61 Interpreting Physician: Jetty Duhamellinton Young MD, ABSM Weight (lbs): 160 RPSGT: Melburn PopperWillard, Susan BMI: 30 MRN: [id number] Neck Size: 15.00 CLINICAL INFORMATION Sleep Study Type: NPSG  Indication for sleep study: OSA  Epworth Sleepiness Score: 3  SLEEP STUDY TECHNIQUE As per the AASM Manual for the Scoring of Sleep and Associated Events v2.3 (April 2016) with a hypopnea requiring 4% desaturations.  The channels recorded and monitored were frontal, central and occipital EEG, electrooculogram (EOG), submentalis EMG (chin), nasal and oral airflow, thoracic and abdominal wall motion, anterior tibialis EMG, snore microphone, electrocardiogram, and pulse oximetry.  MEDICATIONS Medications self-administered by patient taken the night of the study : CRESTOR, PAXIL, OXYCONTIN  SLEEP ARCHITECTURE The study was initiated at 10:31:35 PM and ended at 4:46:17 AM.  Sleep onset time was 29.9 minutes and the sleep efficiency was 49.1%. The total sleep time was 184.0 minutes.  Stage REM latency was N/A minutes.  The patient spent 11.41% of the night in stage N1 sleep, 79.35% in stage N2 sleep, 9.24% in stage N3 and 0.00% in REM.  Alpha intrusion was absent.  Supine sleep was 69.02%.  RESPIRATORY PARAMETERS The overall apnea/hypopnea index (AHI) was 0.0 per hour. There were 0 total apneas, including 0 obstructive, 0 central and 0 mixed apneas. There were 0 hypopneas and 10 RERAs.  The AHI during Stage REM sleep was N/A per hour.  AHI while supine was 0.0 per hour.  The mean oxygen saturation was 91.40%. The minimum SpO2 during sleep was 89.00%.  Moderate snoring was noted during this study.  CARDIAC DATA The 2 lead EKG demonstrated sinus rhythm. The mean heart rate was 68.05 beats per minute. Other EKG findings include: None. LEG  MOVEMENT DATA The total PLMS were 13 with a resulting PLMS index of 4.24. Associated arousal with leg movement index was 0.0 .  IMPRESSIONS - No significant obstructive sleep apnea occurred during this study (AHI = 0.0/h). - No significant central sleep apnea occurred during this study (CAI = 0.0/h). - The patient had minimal or no oxygen desaturation during the study (Min O2 = 89.00%) - The patient snored with Moderate snoring volume. - No cardiac abnormalities were noted during this study. - Clinically significant periodic limb movements did not occur during sleep. No significant associated arousals.  DIAGNOSIS - Primary Snoring (786.09 [R06.83 ICD-10])  RECOMMENDATIONS - Avoid alcohol, sedatives and other CNS depressants that may worsen sleep apnea and disrupt normal sleep architecture. - Sleep hygiene should be reviewed to assess factors that may improve sleep quality. - Weight management and regular exercise should be initiated or continued if appropriate.  [Electronically signed] 03/29/2016 10:25 AM  Jetty Duhamellinton Young MD, ABSM Diplomate, American Board of Sleep Medicine   NPI: [id number]  Waymon BudgeYOUNG,CLINTON D Diplomate, American Board of Sleep Medicine  ELECTRONICALLY SIGNED ON:  03/29/2016, 10:24 AM [facility] PH: [phone]   FX: [phone] ACCREDITED BY THE AMERICAN ACADEMY OF SLEEP MEDICINE

## 2016-03-30 ENCOUNTER — Other Ambulatory Visit: Payer: Self-pay | Admitting: Adult Health

## 2016-03-30 DIAGNOSIS — K219 Gastro-esophageal reflux disease without esophagitis: Secondary | ICD-10-CM

## 2016-03-30 DIAGNOSIS — Z760 Encounter for issue of repeat prescription: Secondary | ICD-10-CM

## 2016-04-15 ENCOUNTER — Other Ambulatory Visit: Payer: Self-pay | Admitting: Adult Health

## 2016-04-15 DIAGNOSIS — F32A Depression, unspecified: Secondary | ICD-10-CM

## 2016-04-15 DIAGNOSIS — F329 Major depressive disorder, single episode, unspecified: Secondary | ICD-10-CM

## 2016-04-15 DIAGNOSIS — Z760 Encounter for issue of repeat prescription: Secondary | ICD-10-CM

## 2016-04-15 NOTE — Telephone Encounter (Signed)
Ok to refill for one month.   I am going to be unable to fill this medication for her if she does not show up to her physical. If she does another no show she will be discharged from the practice

## 2016-04-15 NOTE — Telephone Encounter (Signed)
Patient "no showed" for CPE 03/26/16 No upcoming appt  ok to refill?

## 2016-04-16 ENCOUNTER — Other Ambulatory Visit: Payer: Self-pay | Admitting: Adult Health

## 2016-04-16 DIAGNOSIS — Z760 Encounter for issue of repeat prescription: Secondary | ICD-10-CM

## 2016-04-16 DIAGNOSIS — K219 Gastro-esophageal reflux disease without esophagitis: Secondary | ICD-10-CM

## 2016-04-23 ENCOUNTER — Encounter: Payer: 59 | Admitting: Adult Health

## 2016-04-28 IMAGING — CT CT CHEST HIGH RESOLUTION W/O CM
2 of 6 series · 8 of 36 positions shown, 10 images · non-contrast
Comparison: Chest CT 09/17/2012.

CLINICAL DATA: 58-year-old female with intermittent shortness of
breath for 1 year. Intermittent chest pain off and on during the
same time period.

EXAM:
CT CHEST WITHOUT CONTRAST
TECHNIQUE: Multidetector CT imaging of the chest was performed following the
standard protocol without intravenous contrast. High resolution
imaging of the lungs, as well as inspiratory and expiratory imaging,
was performed.

[Series 8: hires retro entire lungs · axial · 0.66mm/px · z∈[+1036,+1206]mm · 5 of 27 slices shown, 7 images]
[im 5/27  mediastinal]
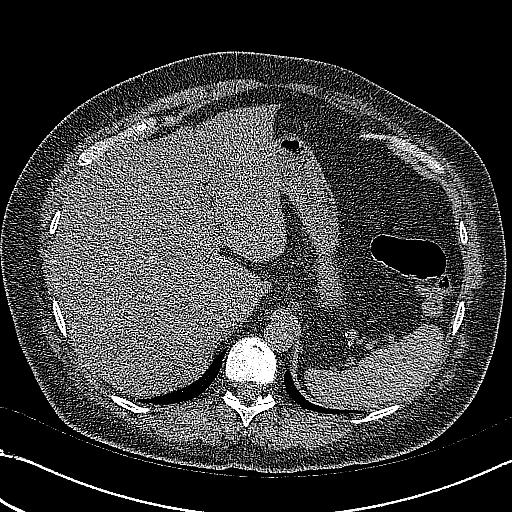
[im 5/27  lung]
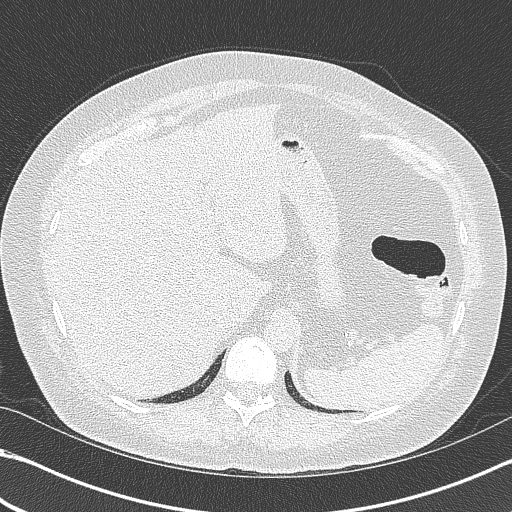
[im 9/27  lung]
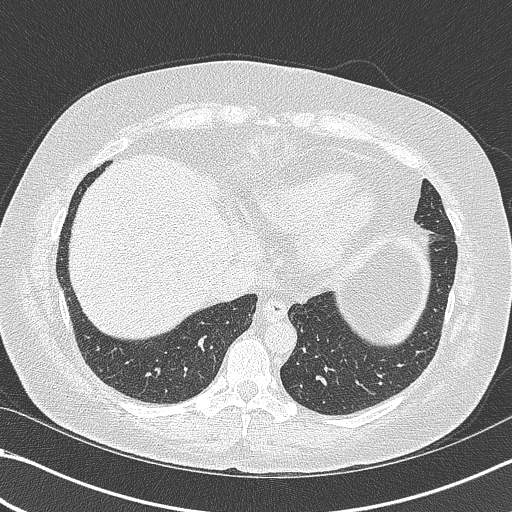
[im 14/27  lung]
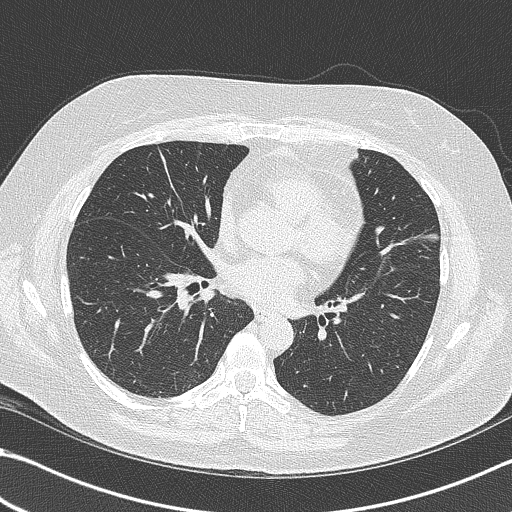
[im 18/27  lung]
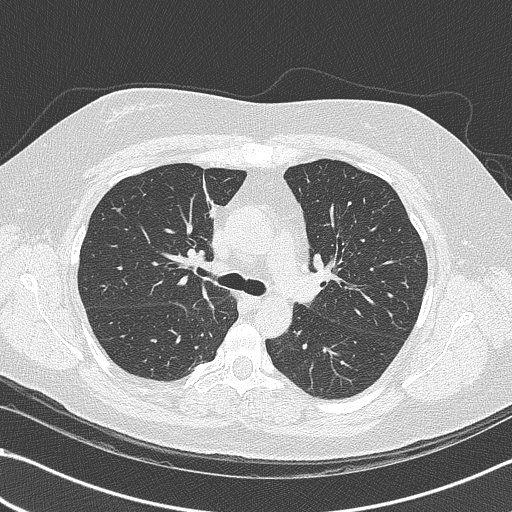
[im 22/27  mediastinal]
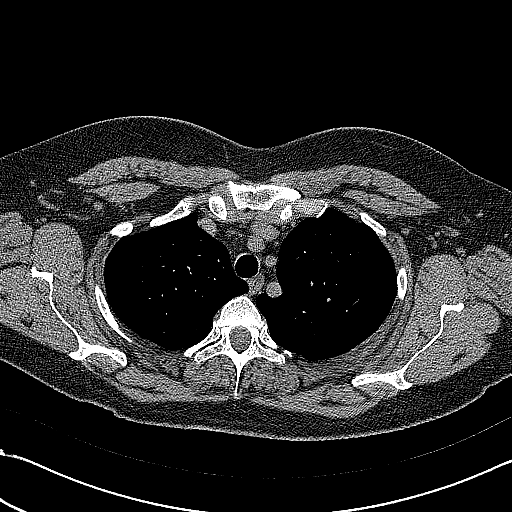
[im 22/27  lung]
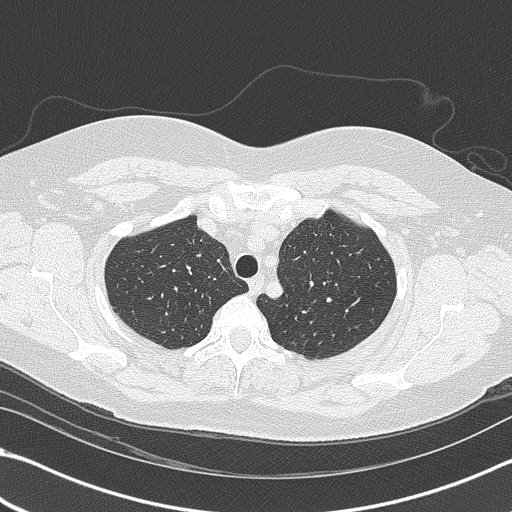

[Series 603: <mpr thick range> · coronal · 0.66mm/px · 3 of 114 slices shown]
[im 23/114  lung]
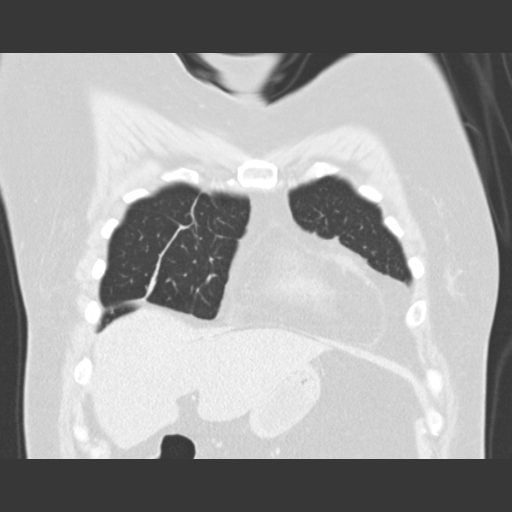
[im 46/114  lung]
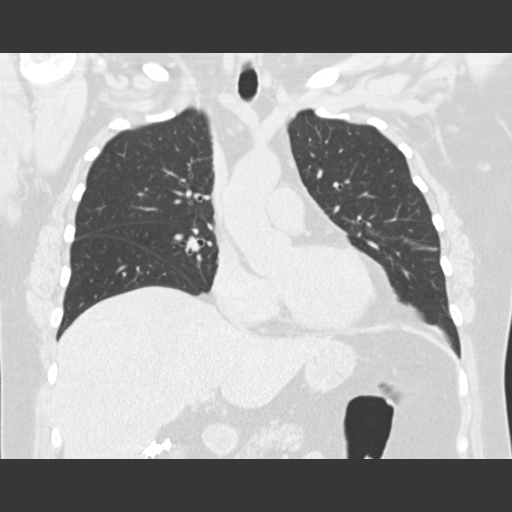
[im 68/114  lung]
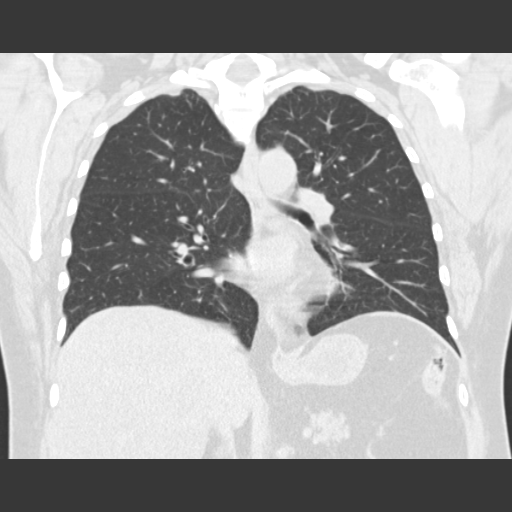

[8 of 36 positions shown; findings below may reference images not displayed]

FINDINGS: Mediastinum/Lymph Nodes: Heart size is normal. Small amount of
pericardial fluid and/or thickening anteriorly, unlikely to be of
any hemodynamic significance at this time. No associated pericardial
calcification. There is atherosclerosis of the thoracic aorta, the
great vessels of the mediastinum and the coronary arteries,
including calcified atherosclerotic plaque in the distal left main
and left anterior descending coronary arteries. No pathologically
enlarged mediastinal or hilar lymph nodes. Please note that accurate
exclusion of hilar adenopathy is limited on noncontrast CT scans.
Esophagus is unremarkable in appearance. No axillary
lymphadenopathy.

Lungs/Pleura: No acute consolidative airspace disease. No pleural
effusions. Linear scarring in the inferior segment of the lingula,
medial aspect of the right upper lobe and in the central right
middle lobe. No suspicious appearing pulmonary nodules or masses.
High-resolution images demonstrate no significant regions of
ground-glass attenuation, subpleural reticulation, traction
bronchiectasis or frank honeycombing to suggest interstitial lung
disease at this time. Inspiratory and expiratory imaging
demonstrates extensive air trapping, indicative of small airways
disease.

Upper Abdomen: Heterogeneous low attenuation throughout the hepatic
parenchyma, compatible with hepatic steatosis. Status post
cholecystectomy. Surgical clips near the splenic hilum.

Musculoskeletal/Soft Tissues: There are no aggressive appearing
lytic or blastic lesions noted in the visualized portions of the
skeleton.
IMPRESSION: 1. No findings to suggest interstitial lung disease.
2. However, there is significant air trapping indicative of small
airways disease.
3. Atherosclerosis, including left main and left anterior descending
coronary artery disease. Please note that although the presence of
coronary artery calcium documents the presence of coronary artery
disease, the severity of this disease and any potential stenosis
cannot be assessed on this non-gated CT examination. Assessment for
potential risk factor modification, dietary therapy or pharmacologic
therapy may be warranted, if clinically indicated.
4. Small amount of pericardial fluid and/or thickening anteriorly,
unlikely to be of hemodynamic significance at this time. No
pericardial calcification.
5. Hepatic steatosis.

## 2016-05-06 ENCOUNTER — Other Ambulatory Visit: Payer: Self-pay | Admitting: Adult Health

## 2016-05-06 DIAGNOSIS — Z760 Encounter for issue of repeat prescription: Secondary | ICD-10-CM

## 2016-05-06 DIAGNOSIS — K219 Gastro-esophageal reflux disease without esophagitis: Secondary | ICD-10-CM

## 2016-05-06 NOTE — Telephone Encounter (Signed)
Will need a office visit for any further refills on any medications    Were you able to get a hold of her about missing appointments?

## 2016-05-06 NOTE — Telephone Encounter (Signed)
Patient has no showed to last 2 CPE appts - ok to refill?

## 2016-05-07 NOTE — Telephone Encounter (Signed)
I had notified patient previously bia voicemaill that patient had no showed to multiple visits, and that any further visits with no shoes could result in discharge for office.   I left message for patient to return phone call regarding this medication

## 2016-05-14 ENCOUNTER — Telehealth: Payer: Self-pay | Admitting: Adult Health

## 2016-05-14 NOTE — Telephone Encounter (Signed)
Pharmacy notified not to refill these medications.

## 2016-05-14 NOTE — Telephone Encounter (Signed)
Claudia Luna has refused these medications previously due to patient no showing to multiple appointments. I have notified patient that she needs to be seen for further refills. Would Dr. [place] refill these medications?

## 2016-05-14 NOTE — Telephone Encounter (Signed)
Pharmacy is calling for the pt they state that they sent in 3 refill request but it was being sent to Dr. Betty [place].  Medications that pt is trying to refill was prescribed by Dr. [place] December of 2017.  Pharmacy would like to see if they can refill meclizine and ondansetron [facility]) can they refill it?

## 2016-05-14 NOTE — Telephone Encounter (Signed)
No refill requests have been received by [place] for these medications. The medications were prescribed for an acute visit, not for long term use. The refills will be denied.

## 2016-05-16 ENCOUNTER — Other Ambulatory Visit: Payer: Self-pay | Admitting: Adult Health

## 2016-05-16 DIAGNOSIS — F32A Depression, unspecified: Secondary | ICD-10-CM

## 2016-05-16 DIAGNOSIS — Z760 Encounter for issue of repeat prescription: Secondary | ICD-10-CM

## 2016-05-16 DIAGNOSIS — F329 Major depressive disorder, single episode, unspecified: Secondary | ICD-10-CM

## 2016-05-30 ENCOUNTER — Other Ambulatory Visit: Payer: Self-pay | Admitting: Adult Health

## 2016-05-30 DIAGNOSIS — K219 Gastro-esophageal reflux disease without esophagitis: Secondary | ICD-10-CM

## 2016-05-30 DIAGNOSIS — Z760 Encounter for issue of repeat prescription: Secondary | ICD-10-CM

## 2016-05-30 NOTE — Telephone Encounter (Signed)
refused

## 2016-10-24 ENCOUNTER — Encounter: Payer: Self-pay | Admitting: Adult Health

## 2016-12-22 ENCOUNTER — Ambulatory Visit: Payer: 59 | Admitting: Family Medicine

## 2016-12-24 ENCOUNTER — Encounter: Payer: Self-pay | Admitting: Family Medicine

## 2016-12-24 ENCOUNTER — Ambulatory Visit: Payer: 59 | Admitting: Family Medicine

## 2016-12-24 VITALS — BP 124/68 | HR 86 | Temp 98.5°F | Resp 17 | Ht 61.0 in | Wt 167.1 lb

## 2016-12-24 DIAGNOSIS — F411 Generalized anxiety disorder: Secondary | ICD-10-CM

## 2016-12-24 DIAGNOSIS — B001 Herpesviral vesicular dermatitis: Secondary | ICD-10-CM | POA: Diagnosis not present

## 2016-12-24 DIAGNOSIS — R42 Dizziness and giddiness: Secondary | ICD-10-CM | POA: Diagnosis not present

## 2016-12-24 DIAGNOSIS — Z23 Encounter for immunization: Secondary | ICD-10-CM | POA: Diagnosis not present

## 2016-12-24 DIAGNOSIS — E039 Hypothyroidism, unspecified: Secondary | ICD-10-CM | POA: Diagnosis not present

## 2016-12-24 DIAGNOSIS — G43909 Migraine, unspecified, not intractable, without status migrainosus: Secondary | ICD-10-CM | POA: Diagnosis not present

## 2016-12-24 DIAGNOSIS — E782 Mixed hyperlipidemia: Secondary | ICD-10-CM | POA: Diagnosis not present

## 2016-12-24 DIAGNOSIS — Z1159 Encounter for screening for other viral diseases: Secondary | ICD-10-CM | POA: Diagnosis not present

## 2016-12-24 DIAGNOSIS — M1712 Unilateral primary osteoarthritis, left knee: Secondary | ICD-10-CM

## 2016-12-24 DIAGNOSIS — F5101 Primary insomnia: Secondary | ICD-10-CM | POA: Diagnosis not present

## 2016-12-24 DIAGNOSIS — G894 Chronic pain syndrome: Secondary | ICD-10-CM | POA: Diagnosis not present

## 2016-12-24 DIAGNOSIS — G43009 Migraine without aura, not intractable, without status migrainosus: Secondary | ICD-10-CM

## 2016-12-24 DIAGNOSIS — M797 Fibromyalgia: Secondary | ICD-10-CM | POA: Diagnosis not present

## 2016-12-24 DIAGNOSIS — Z8739 Personal history of other diseases of the musculoskeletal system and connective tissue: Secondary | ICD-10-CM

## 2016-12-24 DIAGNOSIS — Z760 Encounter for issue of repeat prescription: Secondary | ICD-10-CM | POA: Diagnosis not present

## 2016-12-24 HISTORY — DX: Herpesviral vesicular dermatitis: B00.1

## 2016-12-24 HISTORY — DX: Personal history of other diseases of the musculoskeletal system and connective tissue: Z87.39

## 2016-12-24 HISTORY — DX: Fibromyalgia: M79.7

## 2016-12-24 HISTORY — DX: Generalized anxiety disorder: F41.1

## 2016-12-24 HISTORY — DX: Dizziness and giddiness: R42

## 2016-12-24 LAB — LIPID PANEL
Cholesterol: 295 mg/dL — ABNORMAL HIGH (ref 0–200)
HDL: 43.6 mg/dL (ref >39.00)
NonHDL: 251.7
Total CHOL/HDL Ratio: 7
Triglycerides: 267 mg/dL — ABNORMAL HIGH (ref 0.0–149.0)
VLDL: 53.4 mg/dL — ABNORMAL HIGH (ref 0.0–40.0)

## 2016-12-24 LAB — COMPREHENSIVE METABOLIC PANEL
ALT: 35 U/L (ref 0–35)
AST: 26 U/L (ref 0–37)
Albumin: 4.5 g/dL (ref 3.5–5.2)
Alkaline Phosphatase: 61 U/L (ref 39–117)
BUN: 14 mg/dL (ref 6–23)
CO2: 24 mEq/L (ref 19–32)
Calcium: 9.8 mg/dL (ref 8.4–10.5)
Chloride: 106 mEq/L (ref 96–112)
Creatinine, Ser: 0.74 mg/dL (ref 0.40–1.20)
GFR: 85.11 mL/min (ref >60.00)
Glucose, Bld: 95 mg/dL (ref 70–99)
Potassium: 4.1 mEq/L (ref 3.5–5.1)
Sodium: 141 mEq/L (ref 135–145)
Total Bilirubin: 0.7 mg/dL (ref 0.2–1.2)
Total Protein: 7.3 g/dL (ref 6.0–8.3)

## 2016-12-24 LAB — TSH: TSH: 6.19 u[IU]/mL — ABNORMAL HIGH (ref 0.35–4.50)

## 2016-12-24 LAB — LDL CHOLESTEROL, DIRECT: Direct LDL: 198 mg/dL

## 2016-12-24 MED ORDER — RIZATRIPTAN BENZOATE 10 MG PO TABS: ORAL_TABLET | ORAL | 6 refills | Status: DC

## 2016-12-24 MED ORDER — LEVOTHYROXINE SODIUM 125 MCG PO TABS: 125.0000 ug | ORAL_TABLET | Freq: Every day | ORAL | 0 refills | Status: DC

## 2016-12-24 MED ORDER — ONDANSETRON HCL 4 MG PO TABS: 4.0000 mg | ORAL_TABLET | ORAL | 0 refills | Status: DC | PRN

## 2016-12-24 MED ORDER — VALACYCLOVIR HCL 1 G PO TABS: ORAL_TABLET | ORAL | 3 refills | Status: DC

## 2016-12-24 MED ORDER — MECLIZINE HCL 25 MG PO TABS: 25.0000 mg | ORAL_TABLET | Freq: Three times a day (TID) | ORAL | 2 refills | Status: DC | PRN

## 2016-12-24 MED ORDER — PAROXETINE HCL 20 MG PO TABS: 20.0000 mg | ORAL_TABLET | Freq: Every day | ORAL | 0 refills | Status: DC

## 2016-12-24 NOTE — Progress Notes (Signed)
Subjective   CC:  Chief Complaint  Patient presents with  . Establish Care    wants referral to allergist    HPI: Claudia Luna Joseph ArtWoods is a 61 y.o. female who presents to [facility] today to establish care with me as a new patient.  She has the following concerns or needs:   61 year old with significant past medical history reviewed and documented below in detail.  Her problem list was created and updated.  She has multiple chronic medical problems some of which are uncontrolled.  She is no longer working due to her chronic fibromyalgia and she is managed by chronic pain specialist.  She is on chronic narcotics for this pain.  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  She has multiple specialist managing her care.  Currently she is in need of medication refills for these chronic medical problems.  She is overdue for health maintenance visit.  Of note, she gets headaches weekly and requests Maxalt refills.  She has seen Dr. Catalina LungerHagan, the neurologist for her headaches in the past but it has been several years.  She is not on preventive medications at this time.  She has had Botox injections most recently given by Dr. Odis LusterBowers of plastic surgery.  Hypothyroidism-she has had this for 20s.  It has been well controlled however she has been on medications for several months.  She would like refill of this.  She has noted thinning of the hair and dry skin.  She is also noted some weight gain.  She has been seen by an endocrinologist in the past.  She suffers from seasonal allergies and complains of ear fullness that time.  She would like to see an allergist at some point.  She has seen a pulmonologist.  She had a negative sleep study this year.  She has  history of hyperlipidemia.  She has been out of her Crestor for several months.  She denies diabetes hypertension or coronary vascular disease risk factors.  She is obese and sedentary.  She has chronic intermittent vertigo exacerbated by sinus infections.  She requests meclizine and Zofran to help alleviate the symptoms when she gets them.  I have reviewed prior office notes documenting her symptoms of BPPV.  She has severe osteoarthritis of her left knee-she says she is postponing knee replacement surgery.  She does have an orthopedic doctor, Dr. Antony OdeaLucio.  We updated and reviewed the patient's past history in detail and it is documented below.  Patient Active Problem List   Diagnosis Date Noted  . Fibromyalgia, primary 12/24/2016    Priority: High  . GAD (generalized anxiety disorder) 12/24/2016    Priority: High  . Chronic pain syndrome 12/24/2016    Priority: High  . Mixed hyperlipidemia 01/19/2007    Priority: High  . Hypothyroidism (acquired) 09/01/2006    Priority: High  . History of herniated intervertebral disc 12/24/2016    Priority: Medium  . Migraine headache without aura 12/06/2015    Priority: Medium  . 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    Priority: Medium  . Vertigo 12/24/2016    Priority: Low  . 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 Upper airway cough syndrome 04/13/2012    Priority: Low  . Reactive airway disease that is not asthma 12/29/2007  Priority: Low  . Osteoarthritis of left knee      Health Maintenance  Topic Date Due  . Hepatitis C Screening  [birth year]  . INFLUENZA VACCINE  09/03/2016  . MAMMOGRAM  03/06/2017  . COLONOSCOPY  09/16/2018  . TETANUS/TDAP  08/04/2019  . HIV Screening  Completed   Immunization History  Administered Date(s) Administered  . DTaP 11/05/2008  . Hep A / Hep B 03/31/2010, 09/23/2010, 10/21/2010  . Influenza Whole 02/02/2004, 12/02/2006, 12/28/2007  . Influenza,inj,Quad PF,6+ Mos 12/02/2012, 01/31/2015,  11/08/2015, 12/24/2016  . Influenza-Unspecified 11/30/2013  . Pneumococcal Polysaccharide-23 12/28/2007  . Zoster 10/27/2011   Current Meds  Medication Sig  . Biotin 5000 MCG TABS Take by mouth.  . gabapentin (NEURONTIN) 300 MG capsule Take 300 mg by mouth daily.  Marland Kitchen ibuprofen (ADVIL,MOTRIN) 200 MG tablet 4 tabs three times daily as needed for joint pain - take with food  . Lutein 40 MG CAPS Take by mouth.  . meclizine (ANTIVERT) 25 MG tablet Take 1 tablet (25 mg total) by mouth 3 (three) times daily as needed for dizziness.  Marland Kitchen omeprazole (PRILOSEC) 20 MG capsule TAKE 1 CAPSULE BY MOUTH TWICE A DAY  . ondansetron (ZOFRAN) 4 MG tablet Take 1 tablet (4 mg total) by mouth as needed.  Marland Kitchen oxyCODONE-acetaminophen (PERCOCET/ROXICET) 5-325 MG tablet Take 1 tablet by mouth 4 (four) times daily.  Marland Kitchen PARoxetine (PAXIL) 20 MG tablet Take 1 tablet (20 mg total) by mouth daily.  . RESTASIS 0.05 % ophthalmic emulsion INSTILL 1 GTT INTO OU BID  . rizatriptan (MAXALT) 10 MG tablet TAKE 1 TABLET BY MOUTH DAILY AS NEEDED FOR HEADACHE  . valACYclovir (VALTREX) 1000 MG tablet Take 2 tabs at onset of cold sore and repeat in 12 hours  . [DISCONTINUED] meclizine (ANTIVERT) 25 MG tablet Take 25 mg by mouth 3 (three) times daily as needed for dizziness.  . [DISCONTINUED] NONFORMULARY OR COMPOUNDED ITEM [facility]:  Scar Cream - Veramamil 10%, Pentoxifylline 5%, apply 1-2 grams to affected area daily.  . [DISCONTINUED] ondansetron (ZOFRAN) 4 MG tablet Take 4 mg by mouth as needed.   . [DISCONTINUED] oxyCODONE-acetaminophen (PERCOCET) 7.5-325 MG tablet Take 1 tablet by mouth every 8 (eight) hours as needed. Pain.   . [DISCONTINUED] OXYCONTIN 10 MG 12 hr tablet Take 10 mg by mouth 2 (two) times daily.   . [DISCONTINUED] PARoxetine (PAXIL) 20 MG tablet TAKE 1 TABLET (20 MG TOTAL) BY MOUTH EVERY MORNING.  . [DISCONTINUED] rizatriptan (MAXALT) 10 MG tablet TAKE 1 TABLET BY MOUTH DAILY AS NEEDED FOR HEADACHE  .  [DISCONTINUED] valACYclovir (VALTREX) 1000 MG tablet TAKE 1 TABLET BY MOUTH DAILY AS DIRECTED WHEN NEEDED FOR COLD SORES    Allergies: Patient  reports that she drinks alcohol. Past Medical History Patient  has a past medical history of Anxiety, Chronic kidney disease, Depression, Fibromyalgia, Fibromyalgia, primary (12/24/2016), GAD (generalized anxiety disorder) (12/24/2016), Gout, Headache(784.0), History of herniated intervertebral disc (12/24/2016), Hyperlipidemia, Osteoarthritis of left knee, Recurrent cold sores (12/24/2016), Thyroid disease, and Vertigo (12/24/2016). Past Surgical History Patient  has a past surgical history that includes Breast surgery (1993); Knee arthroscopy; Appendectomy; Cholecystectomy; Splenic artery embolization; and Abdominal hysterectomy. Social History   Socioeconomic History  . Marital status: Married    Spouse name: Merlyn Albert  . Number of children: 4  . Years of education: None  . Highest education level: None  Social Needs  . Financial resource strain: None  . Food insecurity - worry: None  . Food insecurity - inability:  None  . Transportation needs - medical: None  . Transportation needs - non-medical: None  Occupational History  . Occupation: Freight forwarder    Comment: Quit 2014 - due to health issues  Tobacco Use  . Smoking status: Never Smoker  . Smokeless tobacco: Never Used  Substance and Sexual Activity  . Alcohol use: Yes    Alcohol/week: 0.0 oz    Comment: 2-4 drinks/year  . Drug use: No  . Sexual activity: Yes  Other Topics Concern  . None  Social History Narrative   Regular exercise: yes   Family History  Problem Relation Age of Onset  . Breast cancer Mother        breast  . Stroke Mother        "mini"  . Coronary artery disease Father        status post CABG  . Peripheral vascular disease Father   . Hypertension Father   . Hyperlipidemia Father   . Stroke Father        multiple  . Hypertension Sister   .  High Cholesterol Sister   . GER disease Sister   . Breast cancer Maternal Aunt   . Breast cancer Maternal Grandmother     Review of Systems: Constitutional: negative for fever or malaise Ophthalmic: negative for photophobia, double vision or loss of vision Cardiovascular: negative for chest pain, dyspnea on exertion, or new LE swelling Respiratory: negative for SOB or persistent cough Gastrointestinal: negative for abdominal pain, change in bowel habits or melena Genitourinary: negative for dysuria or gross hematuria Musculoskeletal: negative for new gait disturbance or muscular weakness Integumentary: negative for new or persistent rashes Neurological: negative for TIA or stroke symptoms Psychiatric: negative for SI or delusions Allergic/Immunologic: negative for hives  Patient Care Team    Relationship Specialty Notifications Start End  Willow Ora, MD PCP - General Family Medicine  12/24/16   Verdon Cummins, MD Referring Physician Physical Medicine and Rehabilitation  12/24/16   Noland Fordyce, MD Consulting Physician Obstetrics and Gynecology  12/24/16   Etter Sjogren, MD Consulting Physician Plastic Surgery  12/24/16   Ollen Gross, MD Consulting Physician Orthopedic Surgery  12/24/16   Annia Belt, MD Consulting Physician Neurology  12/24/16     Objective  Vitals: BP 124/68 (BP Location: Left Arm, Patient Position: Sitting, Cuff Size: Normal)   Pulse 86   Temp 98.5 F (36.9 C) (Oral)   Resp 17   Ht 5\' 1"  (1.549 m)   Wt 167 lb 2 oz (75.8 kg)   SpO2 96%   BMI 31.58 kg/m  General:  Well developed, well nourished, no acute distress  Psych:  Alert and oriented,normal mood and affect HEENT:  Normocephalic, atraumatic, non-icteric sclera, PERRL, oropharynx is without mass or exudate, supple neck without adenopathy, mass or thyromegaly, TMs with mild effusion on right Cardiovascular:  RRR without gallop, rub or murmur, nondisplaced PMI, no peripheral  edema Respiratory:  Good breath sounds bilaterally, CTAB with normal respiratory effort Gastrointestinal: normal bowel sounds, soft, non-tender, no noted masses. No HSM, well-healed surgical scars without surgical hernias MSK: no deformities, contusions. Joints are without erythema or swelling Skin:  Warm, no rashes or suspicious lesions noted Neurologic:    Mental status is normal. Gross motor and sensory exams are normal. Normal gait  No visits with results within 1 Day(s) from this visit.  Latest known visit with results is:  Office Visit on 12/06/2015  Component Date Value Ref Range Status  . Sodium 12/06/2015  140  135 - 145 mEq/L Final  . Potassium 12/06/2015 4.3  3.5 - 5.1 mEq/L Final  . Chloride 12/06/2015 104  96 - 112 mEq/L Final  . CO2 12/06/2015 27  19 - 32 mEq/L Final  . Glucose, Bld 12/06/2015 90  70 - 99 mg/dL Final  . BUN 78/29/5621 19  6 - 23 mg/dL Final  . Creatinine, Ser 12/06/2015 0.89  0.40 - 1.20 mg/dL Final  . Calcium 30/86/5784 9.9  8.4 - 10.5 mg/dL Final  . GFR 69/62/9528 69.03  >60.00 mL/min Final  . WBC 12/06/2015 7.4  4.0 - 10.5 K/uL Final  . RBC 12/06/2015 5.25* 3.87 - 5.11 Mil/uL Final  . Hemoglobin 12/06/2015 15.8* 12.0 - 15.0 g/dL Final  . HCT 41/32/4401 46.4* 36.0 - 46.0 % Final  . MCV 12/06/2015 88.4  78.0 - 100.0 fl Final  . MCHC 12/06/2015 34.0  30.0 - 36.0 g/dL Final  . RDW 02/72/5366 13.3  11.5 - 15.5 % Final  . Platelets 12/06/2015 273.0  150.0 - 400.0 K/uL Final  . Neutrophils Relative % 12/06/2015 55.5  43.0 - 77.0 % Final  . Lymphocytes Relative 12/06/2015 35.5  12.0 - 46.0 % Final  . Monocytes Relative 12/06/2015 6.2  3.0 - 12.0 % Final  . Eosinophils Relative 12/06/2015 2.4  0.0 - 5.0 % Final  . Basophils Relative 12/06/2015 0.4  0.0 - 3.0 % Final  . Neutro Abs 12/06/2015 4.1  1.4 - 7.7 K/uL Final  . Lymphs Abs 12/06/2015 2.6  0.7 - 4.0 K/uL Final  . Monocytes Absolute 12/06/2015 0.5  0.1 - 1.0 K/uL Final  . Eosinophils Absolute  12/06/2015 0.2  0.0 - 0.7 K/uL Final  . Basophils Absolute 12/06/2015 0.0  0.0 - 0.1 K/uL Final    Assessment  1. Hypothyroidism (acquired)   2. Mixed hyperlipidemia   3. Primary insomnia   4. Fibromyalgia, primary   5. GAD (generalized anxiety disorder)   6. Chronic pain syndrome   7. Migraine without aura and without status migrainosus, not intractable   8. Vertigo   9. Recurrent cold sores   10. Medication refill   11. Migraine without status migrainosus, not intractable, unspecified migraine type   12. Need for hepatitis C screening test   13. Primary osteoarthritis of left knee   14. Need for influenza vaccination      Plan    Hypothyroidism-we will check TSH today, expected to be high given noncompliance with medication.  Restart medications at prior dose-warned risks of side effects given high dose but patient says she has tolerated this before.  Will need follow-up in 6-12 weeks to recheck levels.  Recheck hyperlipidemia-patient is fasting and has been off statins.  Will recheck levels and calculate atherosclerotic risk disease and recommend therapy if indicated.  Recommend low-fat diet and exercise but pain syndrome is limiting.  Chronic insomnia, fibromyalgia, general anxiety disorder, and migraines-medications have been refilled.  Recommend follow-up with neurology to reassess need for preventive medicines for weekly migraines.  I declined a request for Xanax to be used for sleep.  We will readdress needs for insomnia at future visits  Chronic pain-patient to manage this with her pain management specialist.  She is high risk given her chronic narcotic use  Flu shot given today.  Screen for hepatitis C.  Refill modifications for recurrent cold sores to be used as needed  Return in about 3 months (around 03/26/2017) for your complete physical.  Commons side effects, risks, benefits, and alternatives  for medications and treatment plan prescribed today were discussed, and  the patient expressed understanding of the given instructions. Patient is instructed to call or message via MyChart if he/she has any questions or concerns regarding our treatment plan. No barriers to understanding were identified. We discussed Red Flag symptoms and signs in detail. Patient expressed understanding regarding what to do in case of urgent or emergency type symptoms.   Medication list was reconciled, printed and provided to the patient in AVS. Patient instructions and summary information was reviewed with the patient as documented in the AVS. This note was prepared with assistance of Dragon voice recognition software. Occasional wrong-word or sound-a-like substitutions may have occurred due to the inherent limitations of voice recognition software  Orders Placed This Encounter  Procedures  . Flu Vaccine QUAD 6+ mos PF IM (Fluarix Quad PF)  . Comprehensive metabolic panel  . Lipid panel  . TSH  . Hepatitis C Antibody   Meds ordered this encounter  Medications  . oxyCODONE-acetaminophen (PERCOCET/ROXICET) 5-325 MG tablet    Sig: Take 1 tablet by mouth 4 (four) times daily.  Marland Kitchen. levothyroxine (SYNTHROID, LEVOTHROID) 125 MCG tablet    Sig: Take 1 tablet (125 mcg total) by mouth daily.    Dispense:  90 tablet    Refill:  0    Patient needs appointment with provider for any further refills.  Marland Kitchen. PARoxetine (PAXIL) 20 MG tablet    Sig: Take 1 tablet (20 mg total) by mouth daily.    Dispense:  90 tablet    Refill:  0    **MUST HAVE CPE FOR FURTHER REFILLS.**  . rizatriptan (MAXALT) 10 MG tablet    Sig: TAKE 1 TABLET BY MOUTH DAILY AS NEEDED FOR HEADACHE    Dispense:  10 tablet    Refill:  6  . valACYclovir (VALTREX) 1000 MG tablet    Sig: Take 2 tabs at onset of cold sore and repeat in 12 hours    Dispense:  28 tablet    Refill:  3  . meclizine (ANTIVERT) 25 MG tablet    Sig: Take 1 tablet (25 mg total) by mouth 3 (three) times daily as needed for dizziness.    Dispense:  30  tablet    Refill:  2  . ondansetron (ZOFRAN) 4 MG tablet    Sig: Take 1 tablet (4 mg total) by mouth as needed.    Dispense:  20 tablet    Refill:  0

## 2016-12-24 NOTE — Patient Instructions (Addendum)
Please make a follow up appointment with Dr. Darrow Bussinghristine Hagan for your headaches.  You can take Zyrtec 10mg  nightly for your allergy symptoms.  Please return in 6-12 weeks for your complete physical with me. We will review your blood work results at that time.   It was a pleasure to see you. Thank you!

## 2016-12-25 LAB — HEPATITIS C ANTIBODY
Hepatitis C Ab: NONREACTIVE
SIGNAL TO CUT-OFF: 0.02 (ref <1.00)

## 2016-12-30 ENCOUNTER — Other Ambulatory Visit: Payer: Self-pay | Admitting: Family Medicine

## 2016-12-30 ENCOUNTER — Other Ambulatory Visit: Payer: Self-pay | Admitting: *Deleted

## 2016-12-30 DIAGNOSIS — Z760 Encounter for issue of repeat prescription: Secondary | ICD-10-CM

## 2016-12-30 DIAGNOSIS — E785 Hyperlipidemia, unspecified: Secondary | ICD-10-CM

## 2016-12-30 MED ORDER — ROSUVASTATIN CALCIUM 10 MG PO TABS: 10.0000 mg | ORAL_TABLET | Freq: Every day | ORAL | 1 refills | Status: DC

## 2016-12-30 MED ORDER — LEVOTHYROXINE SODIUM 125 MCG PO TABS: 125.0000 ug | ORAL_TABLET | Freq: Every day | ORAL | 3 refills | Status: DC

## 2016-12-30 MED ORDER — VALACYCLOVIR HCL 1 G PO TABS: ORAL_TABLET | ORAL | 0 refills | Status: DC

## 2016-12-30 NOTE — Telephone Encounter (Signed)
Received fax requesting Rx be changed to a 90 day supply, due to insurance and price, done

## 2017-01-02 IMAGING — CR DG CHEST 2V
2 series · 2 of 2 positions shown · non-contrast
Comparison: Chest x-ray 12/28/2012.

CLINICAL DATA: 58-year-old female with shortness breath and
weakness, with tachycardia. Cough and sinus infection.

EXAM:
CHEST  2 VIEW

[w chest pa]
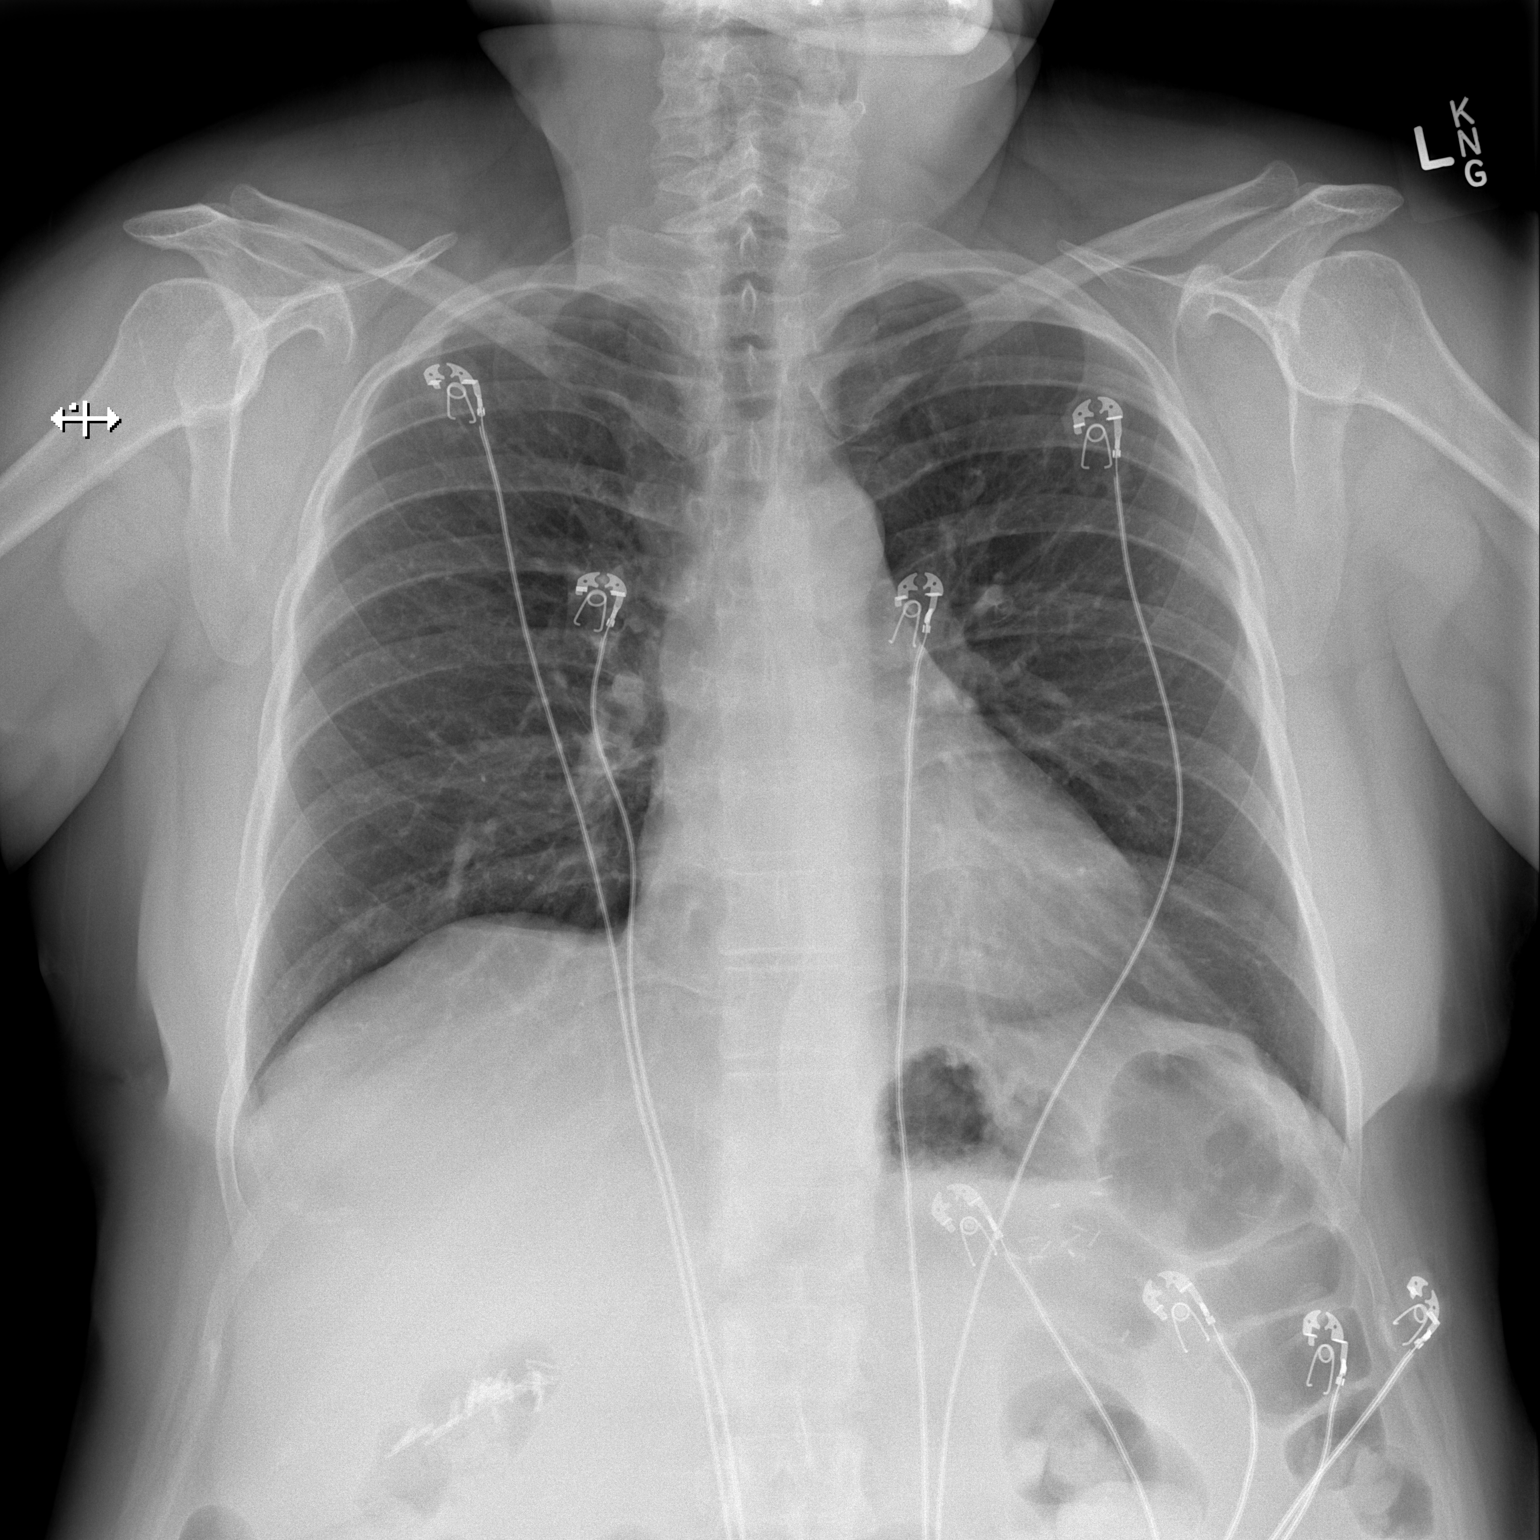

[w chest lat]
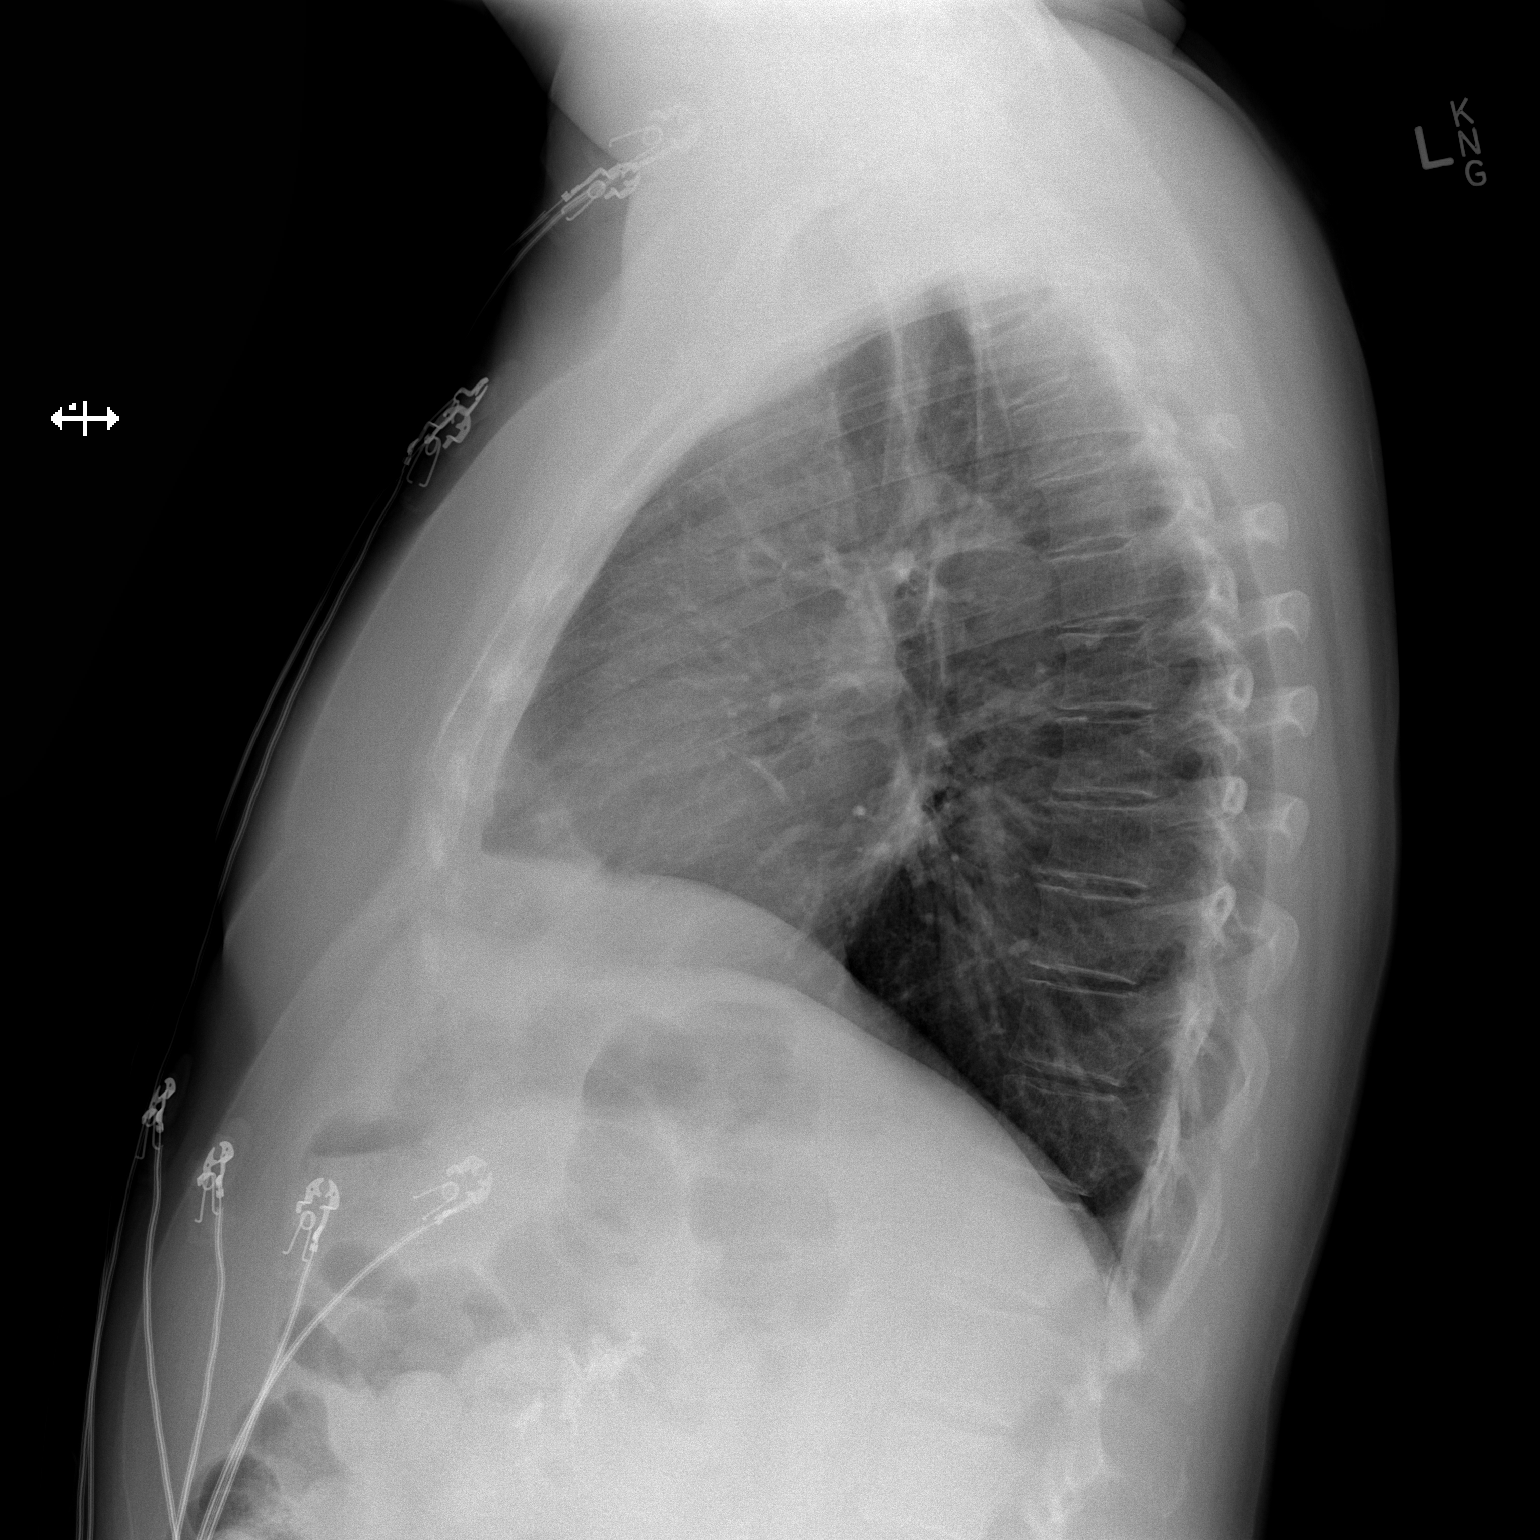

[2 of 2 positions shown; findings below may reference images not displayed]

FINDINGS: Lung volumes are normal. No consolidative airspace disease. No
pleural effusions. No pneumothorax. No pulmonary nodule or mass
noted. Pulmonary vasculature and the cardiomediastinal silhouette
are within normal limits. Numerous surgical clips project over the
upper abdomen bilaterally.
IMPRESSION: No radiographic evidence of acute cardiopulmonary disease.

## 2017-02-16 ENCOUNTER — Encounter: Payer: Self-pay | Admitting: *Deleted

## 2017-02-17 ENCOUNTER — Ambulatory Visit (INDEPENDENT_AMBULATORY_CARE_PROVIDER_SITE_OTHER): Payer: 59 | Admitting: Family Medicine

## 2017-02-17 ENCOUNTER — Encounter: Payer: Self-pay | Admitting: Family Medicine

## 2017-02-17 VITALS — BP 116/80 | HR 95 | Temp 98.1°F | Ht 61.0 in | Wt 167.0 lb

## 2017-02-17 DIAGNOSIS — Z803 Family history of malignant neoplasm of breast: Secondary | ICD-10-CM

## 2017-02-17 DIAGNOSIS — F1120 Opioid dependence, uncomplicated: Secondary | ICD-10-CM

## 2017-02-17 DIAGNOSIS — E782 Mixed hyperlipidemia: Secondary | ICD-10-CM | POA: Diagnosis not present

## 2017-02-17 DIAGNOSIS — G250 Essential tremor: Secondary | ICD-10-CM | POA: Insufficient documentation

## 2017-02-17 DIAGNOSIS — Z0000 Encounter for general adult medical examination without abnormal findings: Secondary | ICD-10-CM | POA: Diagnosis not present

## 2017-02-17 DIAGNOSIS — E039 Hypothyroidism, unspecified: Secondary | ICD-10-CM | POA: Diagnosis not present

## 2017-02-17 DIAGNOSIS — R42 Dizziness and giddiness: Secondary | ICD-10-CM

## 2017-02-17 HISTORY — DX: Essential tremor: G25.0

## 2017-02-17 HISTORY — DX: Family history of malignant neoplasm of breast: Z80.3

## 2017-02-17 LAB — CBC WITH DIFFERENTIAL/PLATELET
Basophils Absolute: 0 10*3/uL (ref 0.0–0.1)
Basophils Relative: 0.6 % (ref 0.0–3.0)
Eosinophils Absolute: 0.2 10*3/uL (ref 0.0–0.7)
Eosinophils Relative: 1.9 % (ref 0.0–5.0)
HCT: 45.4 % (ref 36.0–46.0)
Hemoglobin: 15 g/dL (ref 12.0–15.0)
Lymphocytes Relative: 30 % (ref 12.0–46.0)
Lymphs Abs: 2.4 10*3/uL (ref 0.7–4.0)
MCHC: 33.1 g/dL (ref 30.0–36.0)
MCV: 93.4 fl (ref 78.0–100.0)
Monocytes Absolute: 0.6 10*3/uL (ref 0.1–1.0)
Monocytes Relative: 7.6 % (ref 3.0–12.0)
Neutro Abs: 4.8 10*3/uL (ref 1.4–7.7)
Neutrophils Relative %: 59.9 % (ref 43.0–77.0)
Platelets: 311 10*3/uL (ref 150.0–400.0)
RBC: 4.86 Mil/uL (ref 3.87–5.11)
RDW: 13 % (ref 11.5–15.5)
WBC: 8 10*3/uL (ref 4.0–10.5)

## 2017-02-17 LAB — COMPREHENSIVE METABOLIC PANEL
ALT: 34 U/L (ref 0–35)
AST: 28 U/L (ref 0–37)
Albumin: 4.6 g/dL (ref 3.5–5.2)
Alkaline Phosphatase: 74 U/L (ref 39–117)
BUN: 16 mg/dL (ref 6–23)
CO2: 29 mEq/L (ref 19–32)
Calcium: 9.8 mg/dL (ref 8.4–10.5)
Chloride: 102 mEq/L (ref 96–112)
Creatinine, Ser: 0.85 mg/dL (ref 0.40–1.20)
GFR: 72.5 mL/min (ref >60.00)
Glucose, Bld: 92 mg/dL (ref 70–99)
Potassium: 4.4 mEq/L (ref 3.5–5.1)
Sodium: 141 mEq/L (ref 135–145)
Total Bilirubin: 0.5 mg/dL (ref 0.2–1.2)
Total Protein: 7.5 g/dL (ref 6.0–8.3)

## 2017-02-17 LAB — LIPID PANEL
Cholesterol: 180 mg/dL (ref 0–200)
HDL: 47.4 mg/dL (ref >39.00)
NonHDL: 132.88
Total CHOL/HDL Ratio: 4
Triglycerides: 251 mg/dL — ABNORMAL HIGH (ref 0.0–149.0)
VLDL: 50.2 mg/dL — ABNORMAL HIGH (ref 0.0–40.0)

## 2017-02-17 LAB — TSH: TSH: 0.11 u[IU]/mL — ABNORMAL LOW (ref 0.35–4.50)

## 2017-02-17 LAB — LDL CHOLESTEROL, DIRECT: Direct LDL: 104 mg/dL

## 2017-02-17 NOTE — Patient Instructions (Signed)
Please return in 6 months for recheck. I will release your lab results to you on your MyChart account with further instructions. Please reply with any questions.  We will call you with information regarding your referral appointment. Genetic counseling   If you have any questions or concerns, please don't hesitate to send me a message via MyChart or call the office at [phone]. Thank you for visiting with [place] today! It's our pleasure caring for you.  Please do these things to maintain good health!   Exercise at least 30-45 minutes a day,  4-5 days a week.   Eat a low-fat diet with lots of fruits and vegetables, up to 7-9 servings per day.  Drink plenty of water daily. Try to drink 8 8oz glasses per day.  Seatbelts can save your life. Always wear your seatbelt.  Place Smoke Detectors on every level of your home and check batteries every year.  Schedule an appointment with an eye doctor for an eye exam every 1-2 years  Safe sex - use condoms to protect yourself from STDs if you could be exposed to these types of infections. Use birth control if you do not want to become pregnant and are sexually active.  Avoid heavy alcohol use. If you drink, keep it to less than 2 drinks/day and not every day.  Health Care Power of Attorney.  Choose someone you trust that could speak for you if you became unable to speak for yourself.  Depression is common in our stressful world.If you're feeling down or losing interest in things you normally enjoy, please come in for a visit.  If anyone is threatening or hurting you, please get help. Physical or Emotional Violence is never OK.   Essential Tremor A tremor is trembling or shaking that you cannot control. Most tremors affect the hands or arms. Tremors can also affect the head, vocal cords, face, and other parts of the body. Essential tremor is a tremor without a known cause. What are the causes? Essential tremor has no known cause. What increases  the risk? You may be at greater risk of essential tremor if:  You have a family member with essential tremor.  You are age 61 or older.  You take certain medicines.  What are the signs or symptoms? The main sign of a tremor is uncontrolled and unintentional rhythmic shaking of a body part.  You may have difficulty eating with a spoon or fork.  You may have difficulty writing.  You may nod your head up and down or side to side.  You may have a quivering voice.  Your tremors:  May get worse over time.  May come and go.  May be more noticeable on one side of your body.  May get worse due to stress, fatigue, caffeine, and extreme heat or cold.  How is this diagnosed? Your health care provider can diagnose essential tremor based on your symptoms, medical history, and a physical examination. There is no single test to diagnose an essential tremor. However, your health care provider may perform a variety of tests to rule out other conditions. Tests may include:  Blood and urine tests.  Imaging studies of your brain, such as: ? CT scan. ? MRI.  A test that measures involuntary muscle movement (electromyogram).  How is this treated? Your tremors may go away without treatment. Mild tremors may not need treatment if they do not affect your day-to-day life. Severe tremors may need to be treated using one or  a combination of the following options:  Medicines. This may include medicine that is injected.  Lifestyle changes.  Physical therapy.  Follow these instructions at home:  Take medicines only as directed by your health care provider.  Limit alcohol intake to no more than 1 drink per day for nonpregnant women and 2 drinks per day for men. One drink equals 12 oz of beer, 5 oz of wine, or 1 oz of hard liquor.  Do not use any tobacco products, including cigarettes, chewing tobacco, or electronic cigarettes. If you need help quitting, ask your health care provider.  Take  medicines only as directed by your health care provider.  Avoid extreme heat or cold.  Limit the amount of caffeine you consumeas directed by your health care provider.  Try to get eight hours of sleep each night.  Find ways to manage your stress, such as meditation or yoga.  Keep all follow-up visits as directed by your health care provider. This is important. This includes any physical therapy visits. Contact a health care provider if:  You experience any changes in the location or intensity of your tremors.  You start having a tremor after starting a new medicine.  You have tremor with other symptoms such as: ? Numbness. ? Tingling. ? Pain. ? Weakness.  Your tremor gets worse.  Your tremor interferes with your daily life. This information is not intended to replace advice given to you by your health care provider. Make sure you discuss any questions you have with your health care provider. Document Released: 02/10/2014 Document Revised: 06/28/2015 Document Reviewed: 07/18/2013 Elsevier Interactive Patient Education  [employer]2018 [employer]

## 2017-02-17 NOTE — Progress Notes (Signed)
Subjective  Chief Complaint  Patient presents with  . Annual Exam    Fasting    HPI: Claudia Luna is a 62 y.o. female who presents to [facility] today for a Female Wellness Visit. She also has the concerns and/or needs as listed above in the chief complaint. These will be addressed in addition to the Health Maintenance Visit.   Wellness Visit: annual visit with health maintenance review and exam without Pap - HM items due: mammogram. Needs to work on diet and exercise.   Chronic disease management visit and/or acute problem visit:  Hyperlipidemia f/u: Patient presents for follow up of lipids. Lipids had been well controlled on meds w/o myalgias or side effects. But she had run out of meds. Restarted in November. Compliance with treatment thus far has been good. The patient does not use medications that may worsen dyslipidemias (corticosteroids, progestins, anabolic steroids, diuretics, beta-blockers, amiodarone, cyclosporine, olanzapine). The patient exercises rarely. The patient is not known to have coexisting coronary artery disease. Reports she had been on crestor 20 in the past. Lab Results  Component Value Date   CHOL 295 (H) 12/24/2016   HDL 43.60 12/24/2016   LDLCALC 104 (H) 08/04/2011   LDLDIRECT 198.0 12/24/2016   TRIG 267.0 (H) 12/24/2016   CHOLHDL 7 12/24/2016   The 10-year ASCVD risk score Denman George(Goff DC Jr., et al., 2013) is: 4.3%   Values used to calculate the score:     Age: 89 years     Sex: Female     Is Non-Hispanic African American: No     Diabetic: No     Tobacco smoker: No     Systolic Blood Pressure: 116 mmHg     Is BP treated: No     HDL Cholesterol: 43.6 mg/dL     Total Cholesterol: 295 mg/dL  Hypothyroidism f/u:.Claudia Luna is a 62 y.o. female who presents for follow up of hypothyroidism. Last TSH showed control was unsatisfactory, and thyroid supplement medication was adjusted accordingly. She was restarted on  meds in late November. Current symptoms: change in energy level and low energy and fatigue which hasn't improved since being back on meds. thin hair . Patient denies diarrhea, heat / cold intolerance, nervousness, palpitations and weight changes. Symptoms have waxed and waned.She has been compliant with the medication. Due for lab recheck.  Lab Results  Component Value Date   TSH 6.19 (H) 12/24/2016   Discussed vertigo: daily sxs for last year. Pt attributes to chronic sinus problems. Never before evaluated. No associated neuro sxs. Has migraines but hasn't f/u with neuro yet Strong Fh of breast cancer: mom, maternal aunt and maternal grandmother. Mom is deceased. No genetic screening has ever been done. Gets mammo at [facility].  Lifestyle: Body mass index is 31.55 kg/m. Wt Readings from Last 3 Encounters:  02/17/17 167 lb (75.8 kg)  12/24/16 167 lb 2 oz (75.8 kg)  03/18/16 169 lb 12.8 oz (77 kg)   Diet: general Exercise: rarely, weightlifting   Patient Active Problem List   Diagnosis Date Noted  . Chronic narcotic dependence (HCC) 02/17/2017    Priority: High  . Fibromyalgia, primary 12/24/2016    Priority: High  . GAD (generalized anxiety disorder) 12/24/2016    Priority: High  . Chronic pain syndrome 12/24/2016    Priority: High  . Mixed hyperlipidemia 01/19/2007    Priority: High  . Hypothyroidism (acquired) 09/01/2006    Priority: High  . History of herniated intervertebral disc  12/24/2016    Priority: Medium  . Migraine headache without aura 12/06/2015    Priority: Medium  . 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    Priority: Medium  . Essential tremor 02/17/2017    Priority: Low  . Vertigo 12/24/2016    Priority: Low  . 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 Upper airway cough syndrome 04/13/2012    Priority: Low  . Reactive airway disease that is not asthma 12/29/2007    Priority: Low  . Family history of breast cancer in first degree relative 02/17/2017  .  Osteoarthritis of left knee    Health Maintenance  Topic Date Due  . MAMMOGRAM  03/06/2017  . COLONOSCOPY  09/16/2018  . TETANUS/TDAP  08/04/2019  . INFLUENZA VACCINE  Completed  . Hepatitis C Screening  Completed  . HIV Screening  Completed   Immunization History  Administered Date(s) Administered  . DTaP 11/05/2008  . Hep A / Hep B 03/31/2010, 09/23/2010, 10/21/2010  . Influenza Whole 02/02/2004, 12/02/2006, 12/28/2007  . Influenza,inj,Quad PF,6+ Mos 12/02/2012, 01/31/2015, 11/08/2015, 12/24/2016  . Influenza-Unspecified 11/30/2013  . Pneumococcal Polysaccharide-23 12/28/2007  . Zoster 10/27/2011   We updated and reviewed the patient's past history in detail and it is documented below. Allergies: Patient is allergic to codeine; penicillins; and pregabalin. Past Medical History Patient  has a past medical history of Anxiety, Chronic kidney disease, Depression, Essential tremor (02/17/2017), Family history of breast cancer in first degree relative (02/17/2017), Fibromyalgia, Fibromyalgia, primary (12/24/2016), GAD (generalized anxiety disorder) (12/24/2016), Gout, Headache(784.0), History of herniated intervertebral disc (12/24/2016), Hyperlipidemia, Osteoarthritis of left knee, Recurrent cold sores (12/24/2016), Thyroid disease, and Vertigo (12/24/2016). Past Surgical History Patient  has a past surgical history that includes Breast surgery (1993); Knee arthroscopy; Appendectomy; Cholecystectomy; Splenic artery embolization; and Abdominal hysterectomy. Family History: Patient family history includes Breast cancer in her maternal aunt, maternal grandmother, and mother; Clotting disorder in her mother; Coronary artery disease in her father; GER disease in her sister; High Cholesterol in her sister; Hyperlipidemia in her father; Hypertension in her father and sister; Leukemia in her maternal aunt; Peripheral vascular disease in her father; Stroke in her father and mother. Social History:    Patient  reports that  has never smoked. she has never used smokeless tobacco. She reports that she drinks alcohol. She reports that she does not use drugs.  Review of Systems: Constitutional: negative for fever or malaise Ophthalmic: negative for photophobia, double vision or loss of vision Cardiovascular: negative for chest pain, dyspnea on exertion, or new LE swelling Respiratory: negative for SOB or persistent cough Gastrointestinal: negative for abdominal pain, change in bowel habits or melena Genitourinary: negative for dysuria or gross hematuria, no abnormal uterine bleeding or disharge Musculoskeletal: negative for new gait disturbance or muscular weakness Integumentary: negative for new or persistent rashes, no breast lumps Neurological: negative for TIA or stroke symptoms Psychiatric: negative for SI or delusions Allergic/Immunologic: negative for hives  Patient Care Team    Relationship Specialty Notifications Start End  Willow Ora, MD PCP - General Family Medicine  12/24/16   Verdon Cummins, MD Referring Physician Physical Medicine and Rehabilitation  12/24/16   Noland Fordyce, MD Consulting Physician Obstetrics and Gynecology  12/24/16   Etter Sjogren, MD Consulting Physician Plastic Surgery  12/24/16   Ollen Gross, MD Consulting Physician Orthopedic Surgery  12/24/16   Annia Belt, MD Consulting Physician Neurology  12/24/16     Objective  Vitals: BP 116/80 (BP Location: Left Arm, Patient  Position: Sitting, Cuff Size: Normal)   Pulse 95   Temp 98.1 F (36.7 C) (Oral)   Ht 5\' 1"  (1.549 m)   Wt 167 lb (75.8 kg)   SpO2 95%   BMI 31.55 kg/m  General:  Well developed, well nourished, no acute distress  Psych:  Alert and orientedx3,normal mood and affect HEENT:  Normocephalic, atraumatic, non-icteric sclera, PERRL, oropharynx is clear without mass or exudate, supple neck without adenopathy, mass or thyromegaly Cardiovascular:  Normal S1, S2, RRR without  gallop, rub or murmur, nondisplaced PMI Respiratory:  Good breath sounds bilaterally, CTAB with normal respiratory effort Gastrointestinal: normal bowel sounds, soft, non-tender, no noted masses. No HSM MSK: no deformities, contusions. Joints are without erythema or swelling. Spine and CVA region are nontender Skin:  Warm, no rashes or suspicious lesions noted Neurologic:    Mental status is normal. CN 2-11 are normal. Gross motor and sensory exams are normal. Normal gait. No tremor Breast Exam: No mass, skin retraction or nipple discharge is appreciated in either breast. No axillary adenopathy. Fibrocystic changes are not noted  Assessment  1. Annual physical exam   2. Mixed hyperlipidemia   3. Hypothyroidism (acquired)   4. Chronic narcotic dependence (HCC)   5. Vertigo   6. Essential tremor   7. Family history of breast cancer in first degree relative      Plan  Female Wellness Visit:  Age appropriate Health Maintenance and Prevention measures were discussed with patient. Included topics are cancer screening recommendations, ways to keep healthy (see AVS) including dietary and exercise recommendations, regular eye and dental care, use of seat belts, and avoidance of moderate alcohol use and tobacco use. Mammogram scheduled for next week with [facility]. CRC screen is up to date   BMI: discussed patient's BMI and encouraged positive lifestyle modifications to help get to or maintain a target BMI.  HM needs and immunizations were addressed and ordered. See below for orders. See HM and immunization section for updates. All up to date  Routine labs and screening tests ordered including cmp, cbc and lipids where appropriate.  Discussed recommendations regarding Vit D and calcium supplementation (see AVS)  Chronic disease f/u and/or acute problem visit: (deemed necessary to be done in addition to the wellness visit):  Hyperlipidemia f/u: on crestor 10; recheck levels and increase crestor  dose if needed. Discussed better diet and weight loss. Hypothyroidism f/u: due for recheck of TSH; will adjust medications pending labs. Continue taking daily on empty stomach;avoid iron or Vit D/ca for four hours. Discussed sxs of low and high thyroid levels.   Chronic pain per pain mgt. Well controlled.   Vertigo: rec evaluation with neuro and/or ENT. Pt will let me know if she needs a referral. Doubt related to chronic sinusitis.   Education regarding benign tremor. Not yet bothersome. Follow   Refer for genetic counseling for strong FH of breast ca on maternal side. rec genetic testing.   Follow up: Return in about 6 months (around 08/17/2017) for recheck.  Sooner if thyroid or cholesterol medications need ot be changed.   Commons side effects, risks, benefits, and alternatives for medications and treatment plan prescribed today were discussed, and the patient expressed understanding of the given instructions. Patient is instructed to call or message via MyChart if he/she has any questions or concerns regarding our treatment plan. No barriers to understanding were identified. We discussed Red Flag symptoms and signs in detail. Patient expressed understanding regarding what to do in case of  urgent or emergency type symptoms.   Medication list was reconciled, printed and provided to the patient in AVS. Patient instructions and summary information was reviewed with the patient as documented in the AVS. This note was prepared with assistance of Dragon voice recognition software. Occasional wrong-word or sound-a-like substitutions may have occurred due to the inherent limitations of voice recognition software  Orders Placed This Encounter  Procedures  . CBC with Differential/Platelet  . Comprehensive metabolic panel  . Lipid panel  . TSH  . Ambulatory referral to Genetics   No orders of the defined types were placed in this encounter.

## 2017-02-22 ENCOUNTER — Other Ambulatory Visit: Payer: Self-pay | Admitting: Family Medicine

## 2017-02-22 MED ORDER — LEVOTHYROXINE SODIUM 100 MCG PO TABS: 100.0000 ug | ORAL_TABLET | Freq: Every day | ORAL | 1 refills | Status: DC

## 2017-02-22 NOTE — Progress Notes (Signed)
Decreasing dose of synthroid to 100 from 125. Lab Results  Component Value Date   TSH 0.11 (L) 02/17/2017   Down from 6 after restarting meds.

## 2017-05-11 ENCOUNTER — Ambulatory Visit: Payer: Self-pay | Admitting: Podiatry

## 2017-05-18 ENCOUNTER — Ambulatory Visit: Payer: 59 | Admitting: Family Medicine

## 2017-05-18 DIAGNOSIS — Z0289 Encounter for other administrative examinations: Secondary | ICD-10-CM

## 2017-05-20 ENCOUNTER — Other Ambulatory Visit: Payer: Self-pay | Admitting: Family Medicine

## 2017-05-20 DIAGNOSIS — Z760 Encounter for issue of repeat prescription: Secondary | ICD-10-CM

## 2017-05-20 MED ORDER — PAROXETINE HCL 20 MG PO TABS: 20.0000 mg | ORAL_TABLET | Freq: Every day | ORAL | 3 refills | Status: DC

## 2017-05-20 NOTE — Telephone Encounter (Signed)
Last OV 02/17/17, No future appointment  Last filled 12/24/2016, # 90 with 0 refills

## 2017-07-14 ENCOUNTER — Other Ambulatory Visit: Payer: Self-pay | Admitting: Physical Medicine and Rehabilitation

## 2017-07-14 DIAGNOSIS — M5416 Radiculopathy, lumbar region: Secondary | ICD-10-CM

## 2017-07-16 ENCOUNTER — Other Ambulatory Visit: Payer: Self-pay | Admitting: Family Medicine

## 2017-07-16 DIAGNOSIS — Z760 Encounter for issue of repeat prescription: Secondary | ICD-10-CM

## 2017-07-16 NOTE — Telephone Encounter (Signed)
Received and reviewed medication refill request.  Request is appropriate and was approved.  Please see medication orders for details.  

## 2017-07-16 NOTE — Telephone Encounter (Signed)
Last  OV: 02/17/2017 Fill Date: 05/20/2017

## 2017-07-24 DIAGNOSIS — M1710 Unilateral primary osteoarthritis, unspecified knee: Secondary | ICD-10-CM | POA: Insufficient documentation

## 2017-07-24 DIAGNOSIS — M179 Osteoarthritis of knee, unspecified: Secondary | ICD-10-CM | POA: Insufficient documentation

## 2017-07-28 ENCOUNTER — Telehealth: Payer: Self-pay

## 2017-07-28 ENCOUNTER — Telehealth: Payer: Self-pay | Admitting: Family Medicine

## 2017-07-28 NOTE — Telephone Encounter (Signed)
Received paperwork for "Preoperative Clearnance, via fax, from [facility]. Requesting the paperwork be reviewed, completed, and faxed back. Placed in front bin with charge sheet.

## 2017-07-28 NOTE — Telephone Encounter (Signed)
Called patient and left a voice message to call us back to schedule an appointment for the medical release form that was sent to us from [facility]. Patient needs to have a follow up visit for her blood pressure medication before she can be cleared for surgery.  CRM Created.

## 2017-07-28 NOTE — Telephone Encounter (Signed)
Error

## 2017-07-28 NOTE — Telephone Encounter (Signed)
Correction: Follow up visit is for thyroid medication instead of blood pressure medication.

## 2017-07-28 NOTE — Telephone Encounter (Signed)
Paperwork has been given to Dr. Mardelle MatteAndy to review and complete.

## 2017-08-17 ENCOUNTER — Other Ambulatory Visit: Payer: Self-pay

## 2017-08-17 ENCOUNTER — Encounter: Payer: Self-pay | Admitting: Family Medicine

## 2017-08-17 ENCOUNTER — Ambulatory Visit: Payer: 59 | Admitting: Family Medicine

## 2017-08-17 VITALS — BP 100/68 | HR 87 | Temp 98.5°F | Ht 61.0 in | Wt 170.6 lb

## 2017-08-17 DIAGNOSIS — E039 Hypothyroidism, unspecified: Secondary | ICD-10-CM

## 2017-08-17 DIAGNOSIS — K219 Gastro-esophageal reflux disease without esophagitis: Secondary | ICD-10-CM

## 2017-08-17 DIAGNOSIS — Z01818 Encounter for other preprocedural examination: Secondary | ICD-10-CM

## 2017-08-17 DIAGNOSIS — R682 Dry mouth, unspecified: Secondary | ICD-10-CM

## 2017-08-17 DIAGNOSIS — M1712 Unilateral primary osteoarthritis, left knee: Secondary | ICD-10-CM | POA: Diagnosis not present

## 2017-08-17 DIAGNOSIS — G43909 Migraine, unspecified, not intractable, without status migrainosus: Secondary | ICD-10-CM

## 2017-08-17 MED ORDER — PAROXETINE HCL 20 MG PO TABS: 20.0000 mg | ORAL_TABLET | Freq: Every day | ORAL | 3 refills | Status: DC

## 2017-08-17 MED ORDER — OMEPRAZOLE 20 MG PO CPDR: 20.0000 mg | DELAYED_RELEASE_CAPSULE | Freq: Every day | ORAL | 3 refills | Status: DC

## 2017-08-17 MED ORDER — ONDANSETRON HCL 4 MG PO TABS: 4.0000 mg | ORAL_TABLET | ORAL | 0 refills | Status: DC | PRN

## 2017-08-17 MED ORDER — RIZATRIPTAN BENZOATE 10 MG PO TABS: ORAL_TABLET | ORAL | 6 refills | Status: DC

## 2017-08-17 MED ORDER — ROSUVASTATIN CALCIUM 10 MG PO TABS: 10.0000 mg | ORAL_TABLET | Freq: Every day | ORAL | 3 refills | Status: DC

## 2017-08-17 NOTE — Patient Instructions (Addendum)
Please return in 6 months for your annual complete physical; please come fasting. We will let you know about your thyroid results and sjogrens' labwork and if you need to return sooner for recheck if it is not controlled. Start zyrtec nightly. I have refilled your medications.    If you have any questions or concerns, please don't hesitate to send me a message via MyChart or call the office at [phone]. Thank you for visiting with us today! It's our pleasure caring for you.

## 2017-08-17 NOTE — Progress Notes (Signed)
Subjective  CC:  Chief Complaint  Patient presents with  . Medical Clearance    for Knee Replacement  . Hypothyroidism  . Asthma    Short of Breath, has a Productive Cough     HPI: Claudia Luna is a 62 y.o. female who presents to the office today to address the problems listed above in the chief complaint.   Hypothyroidism f/u: Claudia Luna is a 62 y.o. female who presents for follow up of hypothyroidism. Last TSH showed control was overtreated, and thyroid supplement medication was adjusted accordingly.  Current symptoms: none . Patient denies change in energy level, diarrhea, heat / cold intolerance, nervousness, palpitations and weight changes. Symptoms have been well-controlled.She has been compliant with the medication. Due for lab recheck. Needed for knee replacement preop clearance. Has had hair loss, generalized.  Chronic cough s/p evaluation by pulm: active. No asthma or sob. Does have allergy pnd. Some gerd  Dry mouth, dry eyes on restasis; keeps water at bedside. Drinks all the time: ? Sjogren's. No joint inflammation. Carries dx of fibromyalgia.  Migraines: needs refill on meds. Stable. Prn use of triptan   Assessment  1. Hypothyroidism (acquired)   2. Preoperative clearance   3. Primary osteoarthritis of left knee   4. Gastroesophageal reflux disease, esophagitis presence not specified   5. Migraine without status migrainosus, not intractable, unspecified migraine type   6. Dry mouth      Plan   thyroid:  Recheck levels to ensure new dose is adequate.   Ok for surgery if TSH is at goal  GERD: refilled ppi  Cough: chronic, add zyrtec  Refilled migrain meds  Work up for sjogrens  Follow up: Return in about 6 months (around 02/17/2018) for complete physical.   Orders Placed This Encounter  Procedures  . TSH  . Sjogren's syndrome antibods(ssa + ssb)  . Sedimentation rate  . Rheumatoid Factor   Meds ordered this encounter  Medications    . PARoxetine (PAXIL) 20 MG tablet    Sig: Take 1 tablet (20 mg total) by mouth daily.    Dispense:  90 tablet    Refill:  3  . rosuvastatin (CRESTOR) 10 MG tablet    Sig: Take 1 tablet (10 mg total) by mouth at bedtime.    Dispense:  90 tablet    Refill:  3  . omeprazole (PRILOSEC) 20 MG capsule    Sig: Take 1 capsule (20 mg total) by mouth daily.    Dispense:  90 capsule    Refill:  3  . rizatriptan (MAXALT) 10 MG tablet    Sig: TAKE 1 TABLET BY MOUTH DAILY AS NEEDED FOR HEADACHE    Dispense:  10 tablet    Refill:  6  . ondansetron (ZOFRAN) 4 MG tablet    Sig: Take 1 tablet (4 mg total) by mouth as needed.    Dispense:  20 tablet    Refill:  0      I reviewed the patients updated PMH, FH, and SocHx.    Patient Active Problem List   Diagnosis Date Noted  . Chronic narcotic dependence (HCC) 02/17/2017    Priority: High  . Fibromyalgia, primary 12/24/2016    Priority: High  . GAD (generalized anxiety disorder) 12/24/2016    Priority: High  . Chronic pain syndrome 12/24/2016    Priority: High  . Mixed hyperlipidemia 01/19/2007    Priority: High  . Hypothyroidism (acquired) 09/01/2006    Priority: High  .  History of herniated intervertebral disc 12/24/2016    Priority: Medium  . Migraine headache without aura 12/06/2015    Priority: Medium  . 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    Priority: Medium  . Essential tremor 02/17/2017    Priority: Low  . Vertigo 12/24/2016    Priority: Low  . 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 Upper airway cough syndrome 04/13/2012    Priority: Low  . Reactive airway disease that is not asthma 12/29/2007    Priority: Low  . Osteoarthritis of knee 07/24/2017  . Family history of breast cancer in first degree relative 02/17/2017   Current Meds  Medication Sig  . gabapentin (NEURONTIN) 300 MG capsule Take 300 mg by mouth daily.  Marland Kitchen. ibuprofen (ADVIL,MOTRIN) 200 MG tablet 4 tabs three times daily as needed for joint pain - take  with food  . levothyroxine (SYNTHROID) 100 MCG tablet Take 1 tablet (100 mcg total) by mouth daily before breakfast.  . omeprazole (PRILOSEC) 20 MG capsule Take 1 capsule (20 mg total) by mouth daily.  Marland Kitchen. oxyCODONE-acetaminophen (PERCOCET/ROXICET) 5-325 MG tablet Take 1 tablet by mouth 4 (four) times daily.  Marland Kitchen. PARoxetine (PAXIL) 20 MG tablet Take 1 tablet (20 mg total) by mouth daily.  . RESTASIS 0.05 % ophthalmic emulsion INSTILL 1 GTT INTO OU BID  . rizatriptan (MAXALT) 10 MG tablet TAKE 1 TABLET BY MOUTH DAILY AS NEEDED FOR HEADACHE  . rosuvastatin (CRESTOR) 10 MG tablet Take 1 tablet (10 mg total) by mouth at bedtime.  . valACYclovir (VALTREX) 1000 MG tablet Take 2 tabs at onset of cold sore and repeat in 12 hours  . [DISCONTINUED] omeprazole (PRILOSEC) 20 MG capsule TAKE 1 CAPSULE BY MOUTH TWICE A DAY  . [DISCONTINUED] PARoxetine (PAXIL) 20 MG tablet Take 1 tablet (20 mg total) by mouth daily.  . [DISCONTINUED] rizatriptan (MAXALT) 10 MG tablet TAKE 1 TABLET BY MOUTH DAILY AS NEEDED FOR HEADACHE  . [DISCONTINUED] rosuvastatin (CRESTOR) 10 MG tablet Take 1 tablet (10 mg total) by mouth at bedtime.  . [DISCONTINUED] Vitamin D, Ergocalciferol, (DRISDOL) 50000 units CAPS capsule TAKE 1 CAPSULE BY MOUTH EVERY 7 DAYS    Allergies: Patient is allergic to codeine; penicillins; and pregabalin. Family History: Patient family history includes Breast cancer in her maternal aunt, maternal grandmother, and mother; Clotting disorder in her mother; Coronary artery disease in her father; GER disease in her sister; High Cholesterol in her sister; Hyperlipidemia in her father; Hypertension in her father and sister; Leukemia in her maternal aunt; Peripheral vascular disease in her father; Stroke in her father and mother. Social History:  Patient  reports that she has never smoked. She has never used smokeless tobacco. She reports that she drinks alcohol. She reports that she does not use drugs.  Review of  Systems: Constitutional: Negative for fever malaise or anorexia Cardiovascular: negative for chest pain Respiratory: negative for SOB or persistent cough Gastrointestinal: negative for abdominal pain  Objective  Vitals: BP 100/68   Pulse 87   Temp 98.5 F (36.9 C)   Ht 5\' 1"  (1.549 m)   Wt 170 lb 9.6 oz (77.4 kg)   SpO2 97%   BMI 32.23 kg/m  General: no acute distress , A&Ox3 HEENT: PEERL, conjunctiva normal, Oropharynx moist,neck is supple Cardiovascular:  RRR without murmur or gallop.  Respiratory:  Good breath sounds bilaterally, CTAB with normal respiratory effort Skin:  Warm, no rashes, nl scalp     Commons side effects, risks, benefits, and  alternatives for medications and treatment plan prescribed today were discussed, and the patient expressed understanding of the given instructions. Patient is instructed to call or message via MyChart if he/she has any questions or concerns regarding our treatment plan. No barriers to understanding were identified. We discussed Red Flag symptoms and signs in detail. Patient expressed understanding regarding what to do in case of urgent or emergency type symptoms.   Medication list was reconciled, printed and provided to the patient in AVS. Patient instructions and summary information was reviewed with the patient as documented in the AVS. This note was prepared with assistance of Dragon voice recognition software. Occasional wrong-word or sound-a-like substitutions may have occurred due to the inherent limitations of voice recognition software

## 2017-08-18 ENCOUNTER — Ambulatory Visit: Payer: 59 | Admitting: Family Medicine

## 2017-08-18 ENCOUNTER — Encounter: Payer: Self-pay | Admitting: Family Medicine

## 2017-08-18 LAB — SEDIMENTATION RATE: Sed Rate: 10 mm/hr (ref 0–30)

## 2017-08-18 LAB — SJOGREN'S SYNDROME ANTIBODS(SSA + SSB)
SSA (Ro) (ENA) Antibody, IgG: <1 AI
SSB (La) (ENA) Antibody, IgG: <1 AI

## 2017-08-18 LAB — TSH: TSH: 1.51 u[IU]/mL (ref 0.35–4.50)

## 2017-08-18 LAB — RHEUMATOID FACTOR: Rhuematoid fact SerPl-aCnc: <14 IU/mL (ref <14)

## 2017-08-24 ENCOUNTER — Other Ambulatory Visit: Payer: Self-pay | Admitting: Family Medicine

## 2017-08-26 ENCOUNTER — Inpatient Hospital Stay
Admission: RE | Admit: 2017-08-26 | Discharge: 2017-08-26 | Disposition: A | Discharge status: Home / Self Care | Payer: 59 | Source: Ambulatory Visit | Attending: Physical Medicine and Rehabilitation | Admitting: Physical Medicine and Rehabilitation

## 2017-08-30 ENCOUNTER — Inpatient Hospital Stay
Admission: RE | Admit: 2017-08-30 | Discharge: 2017-08-30 | Disposition: A | Discharge status: Home / Self Care | Payer: 59 | Source: Ambulatory Visit | Attending: Physical Medicine and Rehabilitation | Admitting: Physical Medicine and Rehabilitation

## 2017-09-15 ENCOUNTER — Other Ambulatory Visit (HOSPITAL_COMMUNITY): Payer: 59

## 2017-09-21 ENCOUNTER — Encounter (HOSPITAL_COMMUNITY): Admission: RE | Payer: Self-pay | Source: Ambulatory Visit

## 2017-09-21 ENCOUNTER — Ambulatory Visit (HOSPITAL_COMMUNITY): Admission: RE | Admit: 2017-09-21 | Payer: 59 | Source: Ambulatory Visit | Admitting: Orthopedic Surgery

## 2017-09-21 SURGERY — ARTHROPLASTY, KNEE, TOTAL
Anesthesia: Choice | Site: Knee | Laterality: Left

## 2017-09-29 ENCOUNTER — Ambulatory Visit: Payer: 59 | Admitting: Family Medicine

## 2017-10-08 ENCOUNTER — Ambulatory Visit: Payer: 59 | Admitting: Family Medicine

## 2017-10-08 DIAGNOSIS — Z0289 Encounter for other administrative examinations: Secondary | ICD-10-CM

## 2018-02-08 NOTE — Patient Instructions (Addendum)
Claudia Luna  02/08/2018   Your procedure is scheduled on: 02-22-18    Report to [facility] Main  Entrance     Report to admitting at 10:00AM    Call this number if you have problems the morning of surgery [phone]      Remember: Do not eat food or drink liquids :After Midnight. BRUSH YOUR TEETH MORNING OF SURGERY AND RINSE YOUR MOUTH OUT, NO CHEWING GUM CANDY OR MINTS.     Take these medicines the morning of surgery with A SIP OF WATER: LEVOTHYROXINE, (omeprazole)PRILOSEC, GABAPENTIN, ZYRTEC                                You may not have any metal on your body including hair pins and              piercings  Do not wear jewelry, make-up, lotions, powders or perfumes, deodorant             Do not wear nail polish.  Do not shave  48 hours prior to surgery.     Do not bring valuables to the hospital. [facility] IS NOT             RESPONSIBLE   FOR VALUABLES  Contacts, dentures or bridgework may not be worn into surgery.  Leave suitcase in the car. After surgery it may be brought to your room.                  Please read over the following fact sheets you were given: _____________________________________________________________________             [facility] - Preparing for Surgery Before surgery, you can play an important role.  Because skin is not sterile, your skin needs to be as free of germs as possible.  You can reduce the number of germs on your skin by washing with CHG (chlorahexidine gluconate) soap before surgery.  CHG is an antiseptic cleaner which kills germs and bonds with the skin to continue killing germs even after washing. Please DO NOT use if you have an allergy to CHG or antibacterial soaps.  If your skin becomes reddened/irritated stop using the CHG and inform your nurse when you arrive at Short Stay. Do not shave (including legs and underarms) for at least 48 hours prior to the first CHG shower.  You may shave your  face/neck. Please follow these instructions carefully:  1.  Shower with CHG Soap the night before surgery and the  morning of Surgery.  2.  If you choose to wash your hair, wash your hair first as usual with your  normal  shampoo.  3.  After you shampoo, rinse your hair and body thoroughly to remove the  shampoo.                           4.  Use CHG as you would any other liquid soap.  You can apply chg directly  to the skin and wash                       Gently with a scrungie or clean washcloth.  5.  Apply the CHG Soap to your body ONLY FROM THE NECK DOWN.   Do not use on face/  open                           Wound or open sores. Avoid contact with eyes, ears mouth and genitals (private parts).                       Wash face,  Genitals (private parts) with your normal soap.             6.  Wash thoroughly, paying special attention to the area where your surgery  will be performed.  7.  Thoroughly rinse your body with warm water from the neck down.  8.  DO NOT shower/wash with your normal soap after using and rinsing off  the CHG Soap.                9.  Pat yourself dry with a clean towel.            10.  Wear clean pajamas.            11.  Place clean sheets on your bed the night of your first shower and do not  sleep with pets. Day of Surgery : Do not apply any lotions/deodorants the morning of surgery.  Please wear clean clothes to the hospital/surgery center.  FAILURE TO FOLLOW THESE INSTRUCTIONS MAY RESULT IN THE CANCELLATION OF YOUR SURGERY PATIENT SIGNATURE_________________________________  NURSE SIGNATURE__________________________________  ________________________________________________________________________   Adam Phenix  An incentive spirometer is a tool that can help keep your lungs clear and active. This tool measures how well you are filling your lungs with each breath. Taking long deep breaths may help reverse or decrease the chance of developing breathing  (pulmonary) problems (especially infection) following:  A long period of time when you are unable to move or be active. BEFORE THE PROCEDURE   If the spirometer includes an indicator to show your best effort, your nurse or respiratory therapist will set it to a desired goal.  If possible, sit up straight or lean slightly forward. Try not to slouch.  Hold the incentive spirometer in an upright position. INSTRUCTIONS FOR USE  1. Sit on the edge of your bed if possible, or sit up as far as you can in bed or on a chair. 2. Hold the incentive spirometer in an upright position. 3. Breathe out normally. 4. Place the mouthpiece in your mouth and seal your lips tightly around it. 5. Breathe in slowly and as deeply as possible, raising the piston or the ball toward the top of the column. 6. Hold your breath for 3-5 seconds or for as long as possible. Allow the piston or ball to fall to the bottom of the column. 7. Remove the mouthpiece from your mouth and breathe out normally. 8. Rest for a few seconds and repeat Steps 1 through 7 at least 10 times every 1-2 hours when you are awake. Take your time and take a few normal breaths between deep breaths. 9. The spirometer may include an indicator to show your best effort. Use the indicator as a goal to work toward during each repetition. 10. After each set of 10 deep breaths, practice coughing to be sure your lungs are clear. If you have an incision (the cut made at the time of surgery), support your incision when coughing by placing a pillow or rolled up towels firmly against it. Once you are able to get out of bed, walk around indoors and  cough well. You may stop using the incentive spirometer when instructed by your caregiver.  RISKS AND COMPLICATIONS  Take your time so you do not get dizzy or light-headed.  If you are in pain, you may need to take or ask for pain medication before doing incentive spirometry. It is harder to take a deep breath if you  are having pain. AFTER USE  Rest and breathe slowly and easily.  It can be helpful to keep track of a log of your progress. Your caregiver can provide you with a simple table to help with this. If you are using the spirometer at home, follow these instructions: [place] IF:   You are having difficultly using the spirometer.  You have trouble using the spirometer as often as instructed.  Your pain medication is not giving enough relief while using the spirometer.  You develop fever of 100.5 F (38.1 C) or higher. SEEK IMMEDIATE MEDICAL CARE IF:   You cough up bloody sputum that had not been present before.  You develop fever of 102 F (38.9 C) or greater.  You develop worsening pain at or near the incision site. MAKE SURE YOU:   Understand these instructions.  Will watch your condition.  Will get help right away if you are not doing well or get worse. Document Released: 06/02/2006 Document Revised: 04/14/2011 Document Reviewed: 08/03/2006 Exit[employer] Patient Information 2014 Exit[employer], [place].   ________________________________________________________________________  WHAT IS A BLOOD TRANSFUSION? Blood Transfusion Information  A transfusion is the replacement of blood or some of its parts. Blood is made up of multiple cells which provide different functions.  Red blood cells carry oxygen and are used for blood loss replacement.  White blood cells fight against infection.  Platelets control bleeding.  Plasma helps clot blood.  Other blood products are available for specialized needs, such as hemophilia or other clotting disorders. BEFORE THE TRANSFUSION  Who gives blood for transfusions?   Healthy volunteers who are fully evaluated to make sure their blood is safe. This is blood bank blood. Transfusion therapy is the safest it has ever been in the practice of medicine. Before blood is taken from a donor, a complete history is taken to make sure that person has  no history of diseases nor engages in risky social behavior (examples are intravenous drug use or sexual activity with multiple partners). The donor's travel history is screened to minimize risk of transmitting infections, such as malaria. The donated blood is tested for signs of infectious diseases, such as HIV and hepatitis. The blood is then tested to be sure it is compatible with you in order to minimize the chance of a transfusion reaction. If you or a relative donates blood, this is often done in anticipation of surgery and is not appropriate for emergency situations. It takes many days to process the donated blood. RISKS AND COMPLICATIONS Although transfusion therapy is very safe and saves many lives, the main dangers of transfusion include:   Getting an infectious disease.  Developing a transfusion reaction. This is an allergic reaction to something in the blood you were given. Every precaution is taken to prevent this. The decision to have a blood transfusion has been considered carefully by your caregiver before blood is given. Blood is not given unless the benefits outweigh the risks. AFTER THE TRANSFUSION  Right after receiving a blood transfusion, you will usually feel much better and more energetic. This is especially true if your red blood cells have gotten low (anemic).  The transfusion raises the level of the red blood cells which carry oxygen, and this usually causes an energy increase.  The nurse administering the transfusion will monitor you carefully for complications. HOME CARE INSTRUCTIONS  No special instructions are needed after a transfusion. You may find your energy is better. Speak with your caregiver about any limitations on activity for underlying diseases you may have. SEEK MEDICAL CARE IF:   Your condition is not improving after your transfusion.  You develop redness or irritation at the intravenous (IV) site. SEEK IMMEDIATE MEDICAL CARE IF:  Any of the following  symptoms occur over the next 12 hours:  Shaking chills.  You have a temperature by mouth above 102 F (38.9 C), not controlled by medicine.  Chest, back, or muscle pain.  People around you feel you are not acting correctly or are confused.  Shortness of breath or difficulty breathing.  Dizziness and fainting.  You get a rash or develop hives.  You have a decrease in urine output.  Your urine turns a dark color or changes to pink, red, or brown. Any of the following symptoms occur over the next 10 days:  You have a temperature by mouth above 102 F (38.9 C), not controlled by medicine.  Shortness of breath.  Weakness after normal activity.  The white part of the eye turns yellow (jaundice).  You have a decrease in the amount of urine or are urinating less often.  Your urine turns a dark color or changes to pink, red, or brown. Document Released: 01/18/2000 Document Revised: 04/14/2011 Document Reviewed: 09/06/2007 [facility] Patient Information 2014 [place], [place].  _______________________________________________________________________

## 2018-02-08 NOTE — Progress Notes (Signed)
MEDICAL CLEARANCE , DR. Durward Mallard ANDY 11-27-17 ON CHART

## 2018-02-09 ENCOUNTER — Ambulatory Visit: Payer: 59 | Admitting: Family Medicine

## 2018-02-10 ENCOUNTER — Encounter (HOSPITAL_COMMUNITY): Payer: Self-pay

## 2018-02-10 ENCOUNTER — Encounter (HOSPITAL_COMMUNITY)
Admission: RE | Admit: 2018-02-10 | Discharge: 2018-02-10 | Disposition: A | Discharge status: Home / Self Care | Payer: 59 | Source: Ambulatory Visit | Attending: Orthopedic Surgery | Admitting: Orthopedic Surgery

## 2018-02-10 ENCOUNTER — Other Ambulatory Visit: Payer: Self-pay

## 2018-02-10 DIAGNOSIS — M1712 Unilateral primary osteoarthritis, left knee: Secondary | ICD-10-CM | POA: Diagnosis not present

## 2018-02-10 DIAGNOSIS — Z01812 Encounter for preprocedural laboratory examination: Secondary | ICD-10-CM | POA: Diagnosis not present

## 2018-02-10 HISTORY — DX: Other specified postprocedural states: Z98.890

## 2018-02-10 HISTORY — DX: Nausea with vomiting, unspecified: R11.2

## 2018-02-10 HISTORY — DX: Tinnitus, unspecified ear: H93.19

## 2018-02-10 LAB — COMPREHENSIVE METABOLIC PANEL
ALT: 19 U/L (ref 0–44)
AST: 21 U/L (ref 15–41)
Albumin: 4.2 g/dL (ref 3.5–5.0)
Alkaline Phosphatase: 52 U/L (ref 38–126)
Anion gap: 9 (ref 5–15)
BUN: 12 mg/dL (ref 8–23)
CO2: 23 mmol/L (ref 22–32)
Calcium: 9.2 mg/dL (ref 8.9–10.3)
Chloride: 107 mmol/L (ref 98–111)
Creatinine, Ser: 0.84 mg/dL (ref 0.44–1.00)
GFR calc Af Amer: >60 mL/min (ref >60)
GFR calc non Af Amer: >60 mL/min (ref >60)
Glucose, Bld: 103 mg/dL — ABNORMAL HIGH (ref 70–99)
Potassium: 4.2 mmol/L (ref 3.5–5.1)
Sodium: 139 mmol/L (ref 135–145)
Total Bilirubin: 0.6 mg/dL (ref 0.3–1.2)
Total Protein: 7.5 g/dL (ref 6.5–8.1)

## 2018-02-10 LAB — CBC
HCT: 47.7 % — ABNORMAL HIGH (ref 36.0–46.0)
Hemoglobin: 15.4 g/dL — ABNORMAL HIGH (ref 12.0–15.0)
MCH: 30.1 pg (ref 26.0–34.0)
MCHC: 32.3 g/dL (ref 30.0–36.0)
MCV: 93.3 fL (ref 80.0–100.0)
Platelets: 264 10*3/uL (ref 150–400)
RBC: 5.11 MIL/uL (ref 3.87–5.11)
RDW: 12.9 % (ref 11.5–15.5)
WBC: 6.4 10*3/uL (ref 4.0–10.5)
nRBC: 0.3 % — ABNORMAL HIGH (ref 0.0–0.2)

## 2018-02-10 LAB — SURGICAL PCR SCREEN
MRSA, PCR: NEGATIVE
Staphylococcus aureus: NEGATIVE

## 2018-02-10 LAB — PROTIME-INR
INR: 0.97
Prothrombin Time: 12.7 seconds (ref 11.4–15.2)

## 2018-02-10 LAB — APTT: aPTT: 28 seconds (ref 24–36)

## 2018-02-16 NOTE — H&P (Signed)
TOTAL KNEE ADMISSION H&P  Patient is being admitted for left total knee arthroplasty.  Subjective:  Chief Complaint:left knee pain.  HPI: Claudia Luna, 63 y.o. female, has a history of pain and functional disability in the left knee due to arthritis and has failed non-surgical conservative treatments for greater than 12 weeks to includecorticosteriod injections and activity modification.  Onset of symptoms was gradual, starting 5 years ago with gradually worsening course since that time. The patient noted prior procedures on the knee to include  arthroscopy on the left knee(s).  Patient currently rates pain in the left knee(s) at 7 out of 10 with activity. Patient has worsening of pain with activity and weight bearing and pain that interferes with activities of daily living.  Patient has evidence of bone-on-bone osteoarthritis in the patellofemoral compartment. There is significant medially and lateral compartment spurring by imaging studies. There is no active infection.  Patient Active Problem List   Diagnosis Date Noted  . Osteoarthritis of knee 07/24/2017  . Chronic narcotic dependence (HCC) 02/17/2017  . Essential tremor 02/17/2017  . Family history of breast cancer in first degree relative 02/17/2017  . Fibromyalgia, primary 12/24/2016  . GAD (generalized anxiety disorder) 12/24/2016  . Chronic pain syndrome 12/24/2016  . Vertigo 12/24/2016  . 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 History of herniated intervertebral disc 12/24/2016  . Migraine headache without aura 12/06/2015  . Upper airway cough syndrome 04/13/2012  . Reactive airway disease that is not asthma 12/29/2007  . Mixed hyperlipidemia 01/19/2007  . 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  . Hypothyroidism (acquired) 09/01/2006   Past Medical History:  Diagnosis Date  . Anxiety   . Chronic kidney disease    kidney stones, history of  . Depression   . Essential tremor 02/17/2017  . Family history of breast cancer in first  degree relative 02/17/2017   Mother, maternal aunt and grandmother  . Fibromyalgia   . Fibromyalgia, primary 12/24/2016   Dxd in 1992; with chronic fatigue.   Marland Kitchen GAD (generalized anxiety disorder) 12/24/2016   Treated wit Paxil  . Gout    reports never had gout in her life   . Headache(784.0)   . History of herniated intervertebral disc 12/24/2016  . Hyperlipidemia   . Osteoarthritis of left knee   . PONV (postoperative nausea and vomiting)    severe   . 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 Thyroid disease    hypothyroid  . Tinnitus    left   . Vertigo 12/24/2016   H/o recalcitrant BPPV; now recurs with sinus infections. Uses zofran and meclizine as needed    Past Surgical History:  Procedure Laterality Date  . ABDOMINAL HYSTERECTOMY     complete  . APPENDECTOMY    . BREAST SURGERY  1993   reduction  . CHOLECYSTECTOMY    . KNEE ARTHROSCOPY     total knee. multiple surgeries   . SPLENIC ARTERY EMBOLIZATION  02/2001   aneurysm repair     No current facility-administered medications for this encounter.    Current Outpatient Medications  Medication Sig Dispense Refill Last Dose  . Armodafinil (NUVIGIL) 50 MG tablet Take 50 mg by mouth See admin instructions. Take 50 mg in the morning and may take a second 50 mg dose as needed for alertness     . cetirizine (ZYRTEC) 10 MG tablet Take 10 mg by mouth daily as needed for allergies.     . cyclobenzaprine (FLEXERIL) 10 MG tablet Take 2.5 mg by mouth at  bedtime.     Marland Kitchen. estradiol (VIVELLE-DOT) 0.1 MG/24HR patch Place 1 patch onto the skin 2 (two) times a week.     . gabapentin (NEURONTIN) 100 MG capsule Take 100-300 mg by mouth See admin instructions. Take 100 mg in the morning and afternoon, and 300 mg at night     . ibuprofen (ADVIL,MOTRIN) 200 MG tablet Take 400 mg by mouth every 6 (six) hours. Take with oxycodone   Taking  . levothyroxine (SYNTHROID, LEVOTHROID) 100 MCG tablet TAKE 1 TABLET (100 MCG TOTAL) BY MOUTH DAILY BEFORE  BREAKFAST. 30 tablet 11   . meclizine (ANTIVERT) 25 MG tablet Take 1 tablet (25 mg total) by mouth 3 (three) times daily as needed for dizziness. 30 tablet 2 Not Taking  . omeprazole (PRILOSEC) 20 MG capsule Take 1 capsule (20 mg total) by mouth daily. 90 capsule 3   . ondansetron (ZOFRAN) 4 MG tablet Take 1 tablet (4 mg total) by mouth as needed. (Patient taking differently: Take 4 mg by mouth as needed for nausea or vomiting. ) 20 tablet 0   . oxyCODONE-acetaminophen (PERCOCET/ROXICET) 5-325 MG tablet Take 1 tablet by mouth every 6 (six) hours.    Taking  . PARoxetine (PAXIL) 20 MG tablet Take 1 tablet (20 mg total) by mouth daily. (Patient taking differently: Take 20 mg by mouth at bedtime. ) 90 tablet 3   . progesterone (PROMETRIUM) 200 MG capsule Take 200 mg by mouth daily.     . rizatriptan (MAXALT) 10 MG tablet TAKE 1 TABLET BY MOUTH DAILY AS NEEDED FOR HEADACHE 10 tablet 6   . rosuvastatin (CRESTOR) 10 MG tablet Take 1 tablet (10 mg total) by mouth at bedtime. 90 tablet 3   . valACYclovir (VALTREX) 1000 MG tablet Take 2 tabs at onset of cold sore and repeat in 12 hours 84 tablet 0    Allergies  Allergen Reactions  . Codeine Nausea And Vomiting  . Pregabalin Other (See Comments)    Edema, weight gain  . Penicillins Rash    DID THE REACTION INVOLVE: Swelling of the face/tongue/throat, SOB, or low BP? No Sudden or severe rash/hives, skin peeling, or the inside of the mouth or nose? Unknown Did it require medical treatment? Unknown When did it last happen?childhood allergy  If all above answers are "NO", may proceed with cephalosporin use.      Social History   Tobacco Use  . Smoking status: Never Smoker  . Smokeless tobacco: Never Used  Substance Use Topics  . Alcohol use: Yes    Alcohol/week: 0.0 standard drinks    Comment: 2-4 drinks/year    Family History  Problem Relation Age of Onset  . Breast cancer Mother        breast  . Stroke Mother        "mini"  .  Clotting disorder Mother        ITP  . Coronary artery disease Father        status post CABG  . Peripheral vascular disease Father   . Hypertension Father   . Hyperlipidemia Father   . Stroke Father        multiple  . Hypertension Sister   . High Cholesterol Sister   . GER disease Sister   . Breast cancer Maternal Aunt   . Leukemia Maternal Aunt   . Breast cancer Maternal Grandmother      Review of Systems  Constitutional: Negative for chills and fever.  HENT: Negative for congestion, sore throat  and tinnitus.   Eyes: Negative for double vision, photophobia and pain.  Respiratory: Negative for cough, shortness of breath and wheezing.   Cardiovascular: Negative for chest pain, palpitations and orthopnea.  Gastrointestinal: Negative for heartburn, nausea and vomiting.  Genitourinary: Negative for dysuria, frequency and urgency.  Musculoskeletal: Positive for joint pain.  Neurological: Negative for dizziness, weakness and headaches.    Objective:  Physical Exam  Well nourished and well developed. General: Alert and oriented x3, cooperative and pleasant, no acute distress. Head: normocephalic, atraumatic, neck supple. Eyes: EOMI. Respiratory: breath sounds clear in all fields, no wheezing, rales, or rhonchi. Cardiovascular: Regular rate and rhythm, no murmurs, gallops or rubs.  Abdomen: non-tender to palpation and soft, normoactive bowel sounds. Musculoskeletal: She has a cyst in the anterior portion of her knee this been present for years and years and a small Baker cyst. She has pain on McMurray's with crepitation. No ligamentous instability. Significant patellofemoral crepitation throughout range of motion. Calves soft and nontender. Motor function intact in LE. Strength 5/5 LE bilaterally. Neuro: Distal pulses 2+. Sensation to light touch intact in LE.  Vital signs in last 24 hours:  Blood pressure: 120/88 mmHg Pulse: 84 bpm  Labs:   Estimated body mass index is  32.23 kg/m as calculated from the following:   Height as of 08/17/17: 5\' 1"  (1.549 m).   Weight as of 08/17/17: 77.4 kg.   Imaging Review Plain radiographs demonstrate severe degenerative joint disease of the left knee(s). The overall alignment isneutral. The bone quality appears to be adequate for age and reported activity level.   Preoperative templating of the joint replacement has been completed, documented, and submitted to the Operating Room personnel in order to optimize intra-operative equipment management.   Anticipated LOS equal to or greater than 2 midnights due to - Age 23 and older with one or more of the following:  - Obesity  - Expected need for hospital services (PT, OT, Nursing) required for safe  discharge  - Anticipated need for postoperative skilled nursing care or inpatient rehab  - Active co-morbidities: Chronic kidney disease OR   - Unanticipated findings during/Post Surgery: None  - Patient is a high risk of re-admission due to: None     Assessment/Plan:  End stage arthritis, left knee   The patient history, physical examination, clinical judgment of the provider and imaging studies are consistent with end stage degenerative joint disease of the left knee(s) and total knee arthroplasty is deemed medically necessary. The treatment options including medical management, injection therapy arthroscopy and arthroplasty were discussed at length. The risks and benefits of total knee arthroplasty were presented and reviewed. The risks due to aseptic loosening, infection, stiffness, patella tracking problems, thromboembolic complications and other imponderables were discussed. The patient acknowledged the explanation, agreed to proceed with the plan and consent was signed. Patient is being admitted for inpatient treatment for surgery, pain control, PT, OT, prophylactic antibiotics, VTE prophylaxis, progressive ambulation and ADL's and discharge planning. The patient is  planning to be discharged home.   Therapy Plans: outpatient therapy at [facility] Disposition: home with husband Planned DVT Prophylaxis: aspirin 325mg  BID DME needed: walker PCP: Dr. Asencion Partridge, Preop clearance received TXA: IV Allergies: PCN - childhood reaction, unknown Anesthesia Concerns: nausea/vomiting  - Patient was instructed on what medications to stop prior to surgery. - Follow-up visit in 2 weeks with Dr. Lequita Halt - Begin physical therapy following surgery - Pre-operative lab work as pre-surgical testing - Prescriptions will be provided in  hospital at time of discharge  Arther AbbottKristie Jamisyn Langer, PA-C Orthopedic Surgery [facility] [place]

## 2018-02-21 MED ORDER — BUPIVACAINE LIPOSOME 1.3 % IJ SUSP
20.0000 mL | Freq: Once | INTRAMUSCULAR | Status: DC
  Filled 2018-02-21: qty 20

## 2018-02-22 ENCOUNTER — Encounter (HOSPITAL_COMMUNITY): Payer: Self-pay | Admitting: *Deleted

## 2018-02-22 ENCOUNTER — Other Ambulatory Visit: Payer: Self-pay

## 2018-02-22 ENCOUNTER — Ambulatory Visit (HOSPITAL_COMMUNITY): Payer: 59 | Admitting: Anesthesiology

## 2018-02-22 ENCOUNTER — Ambulatory Visit (HOSPITAL_COMMUNITY): Payer: 59 | Admitting: Physician Assistant

## 2018-02-22 ENCOUNTER — Encounter (HOSPITAL_COMMUNITY)
Admission: RE | Disposition: A | Discharge status: Home / Self Care | Payer: Self-pay | Source: Ambulatory Visit | Attending: Orthopedic Surgery

## 2018-02-22 ENCOUNTER — Observation Stay (HOSPITAL_COMMUNITY)
Admission: RE | Admit: 2018-02-22 | Discharge: 2018-02-24 | Disposition: A | Discharge status: Home / Self Care | Payer: 59 | Source: Ambulatory Visit | Attending: Orthopedic Surgery | Admitting: Orthopedic Surgery

## 2018-02-22 DIAGNOSIS — E039 Hypothyroidism, unspecified: Secondary | ICD-10-CM | POA: Diagnosis not present

## 2018-02-22 DIAGNOSIS — K219 Gastro-esophageal reflux disease without esophagitis: Secondary | ICD-10-CM | POA: Insufficient documentation

## 2018-02-22 DIAGNOSIS — G43909 Migraine, unspecified, not intractable, without status migrainosus: Secondary | ICD-10-CM | POA: Diagnosis not present

## 2018-02-22 DIAGNOSIS — M797 Fibromyalgia: Secondary | ICD-10-CM | POA: Diagnosis not present

## 2018-02-22 DIAGNOSIS — Z791 Long term (current) use of non-steroidal anti-inflammatories (NSAID): Secondary | ICD-10-CM | POA: Insufficient documentation

## 2018-02-22 DIAGNOSIS — F329 Major depressive disorder, single episode, unspecified: Secondary | ICD-10-CM | POA: Diagnosis not present

## 2018-02-22 DIAGNOSIS — N189 Chronic kidney disease, unspecified: Secondary | ICD-10-CM | POA: Insufficient documentation

## 2018-02-22 DIAGNOSIS — M25762 Osteophyte, left knee: Secondary | ICD-10-CM | POA: Insufficient documentation

## 2018-02-22 DIAGNOSIS — E782 Mixed hyperlipidemia: Secondary | ICD-10-CM | POA: Insufficient documentation

## 2018-02-22 DIAGNOSIS — Z79899 Other long term (current) drug therapy: Secondary | ICD-10-CM | POA: Diagnosis not present

## 2018-02-22 DIAGNOSIS — Z79890 Hormone replacement therapy (postmenopausal): Secondary | ICD-10-CM | POA: Insufficient documentation

## 2018-02-22 DIAGNOSIS — M179 Osteoarthritis of knee, unspecified: Secondary | ICD-10-CM | POA: Diagnosis present

## 2018-02-22 DIAGNOSIS — Z79891 Long term (current) use of opiate analgesic: Secondary | ICD-10-CM | POA: Diagnosis not present

## 2018-02-22 DIAGNOSIS — M1712 Unilateral primary osteoarthritis, left knee: Secondary | ICD-10-CM | POA: Diagnosis not present

## 2018-02-22 DIAGNOSIS — M1710 Unilateral primary osteoarthritis, unspecified knee: Secondary | ICD-10-CM

## 2018-02-22 DIAGNOSIS — M25562 Pain in left knee: Secondary | ICD-10-CM | POA: Diagnosis present

## 2018-02-22 DIAGNOSIS — F411 Generalized anxiety disorder: Secondary | ICD-10-CM | POA: Diagnosis not present

## 2018-02-22 HISTORY — PX: TOTAL KNEE ARTHROPLASTY: SHX125

## 2018-02-22 HISTORY — DX: Unspecified asthma, uncomplicated: J45.909

## 2018-02-22 LAB — TYPE AND SCREEN
ABO/RH(D): O POS
Antibody Screen: NEGATIVE

## 2018-02-22 LAB — ABO/RH: ABO/RH(D): O POS

## 2018-02-22 SURGERY — ARTHROPLASTY, KNEE, TOTAL
Anesthesia: Spinal | Laterality: Left

## 2018-02-22 MED ORDER — DEXAMETHASONE SODIUM PHOSPHATE 10 MG/ML IJ SOLN
INTRAMUSCULAR | Status: AC
  Filled 2018-02-22: qty 1

## 2018-02-22 MED ORDER — CEFAZOLIN SODIUM-DEXTROSE 2-4 GM/100ML-% IV SOLN
2.0000 g | Freq: Four times a day (QID) | INTRAVENOUS | Status: AC
  Administered 2018-02-22 – 2018-02-23 (×2): 2 g via INTRAVENOUS
  Filled 2018-02-22 (×2): qty 100

## 2018-02-22 MED ORDER — ACETAMINOPHEN 500 MG PO TABS
1000.0000 mg | ORAL_TABLET | Freq: Four times a day (QID) | ORAL | Status: AC
  Administered 2018-02-22 – 2018-02-23 (×3): 1000 mg via ORAL
  Filled 2018-02-22 (×4): qty 2

## 2018-02-22 MED ORDER — LORATADINE 10 MG PO TABS: 10.0000 mg | ORAL_TABLET | Freq: Every day | ORAL | Status: DC | PRN

## 2018-02-22 MED ORDER — TRANEXAMIC ACID-NACL 1000-0.7 MG/100ML-% IV SOLN
1000.0000 mg | INTRAVENOUS | Status: AC
  Administered 2018-02-22: 1000 mg via INTRAVENOUS
  Filled 2018-02-22: qty 100

## 2018-02-22 MED ORDER — SODIUM CHLORIDE (PF) 0.9 % IJ SOLN
INTRAMUSCULAR | Status: AC
  Filled 2018-02-22: qty 10

## 2018-02-22 MED ORDER — BUPIVACAINE-EPINEPHRINE (PF) 0.5% -1:200000 IJ SOLN
INTRAMUSCULAR | Status: DC | PRN
  Administered 2018-02-22: 15 mL via PERINEURAL

## 2018-02-22 MED ORDER — ARMODAFINIL 50 MG PO TABS: 50.0000 mg | ORAL_TABLET | ORAL | Status: DC

## 2018-02-22 MED ORDER — HYDROMORPHONE HCL 2 MG PO TABS
4.0000 mg | ORAL_TABLET | ORAL | Status: DC | PRN
  Administered 2018-02-23: 6 mg via ORAL
  Administered 2018-02-23: 2 mg via ORAL
  Administered 2018-02-23 (×3): 6 mg via ORAL
  Administered 2018-02-23: 4 mg via ORAL
  Administered 2018-02-24 (×4): 6 mg via ORAL
  Filled 2018-02-22 (×9): qty 3

## 2018-02-22 MED ORDER — PHENYLEPHRINE HCL 10 MG/ML IJ SOLN
INTRAMUSCULAR | Status: DC | PRN
  Administered 2018-02-22: 80 ug via INTRAVENOUS

## 2018-02-22 MED ORDER — ONDANSETRON HCL 4 MG/2ML IJ SOLN: 4.0000 mg | Freq: Four times a day (QID) | INTRAMUSCULAR | Status: DC | PRN

## 2018-02-22 MED ORDER — MENTHOL 3 MG MT LOZG: 1.0000 | LOZENGE | OROMUCOSAL | Status: DC | PRN

## 2018-02-22 MED ORDER — BUPIVACAINE IN DEXTROSE 0.75-8.25 % IT SOLN
INTRATHECAL | Status: DC | PRN
  Administered 2018-02-22: 1.6 mL via INTRATHECAL

## 2018-02-22 MED ORDER — METHOCARBAMOL 500 MG IVPB - SIMPLE MED
500.0000 mg | Freq: Four times a day (QID) | INTRAVENOUS | Status: DC | PRN
  Filled 2018-02-22: qty 50

## 2018-02-22 MED ORDER — TRANEXAMIC ACID-NACL 1000-0.7 MG/100ML-% IV SOLN
1000.0000 mg | Freq: Once | INTRAVENOUS | Status: AC
  Administered 2018-02-22: 1000 mg via INTRAVENOUS
  Filled 2018-02-22: qty 100

## 2018-02-22 MED ORDER — PAROXETINE HCL 20 MG PO TABS
20.0000 mg | ORAL_TABLET | Freq: Every day | ORAL | Status: DC
  Administered 2018-02-22 – 2018-02-23 (×2): 20 mg via ORAL
  Filled 2018-02-22 (×2): qty 1

## 2018-02-22 MED ORDER — ACETAMINOPHEN 10 MG/ML IV SOLN
1000.0000 mg | Freq: Four times a day (QID) | INTRAVENOUS | Status: DC
  Administered 2018-02-22: 1000 mg via INTRAVENOUS
  Filled 2018-02-22: qty 100

## 2018-02-22 MED ORDER — ACETAMINOPHEN 10 MG/ML IV SOLN: 1000.0000 mg | Freq: Once | INTRAVENOUS | Status: DC | PRN

## 2018-02-22 MED ORDER — GABAPENTIN 100 MG PO CAPS
400.0000 mg | ORAL_CAPSULE | Freq: Once | ORAL | Status: DC
  Filled 2018-02-22: qty 4

## 2018-02-22 MED ORDER — ROSUVASTATIN CALCIUM 10 MG PO TABS
10.0000 mg | ORAL_TABLET | Freq: Every day | ORAL | Status: DC
  Administered 2018-02-22 – 2018-02-23 (×2): 10 mg via ORAL
  Filled 2018-02-22 (×2): qty 1

## 2018-02-22 MED ORDER — ONDANSETRON HCL 4 MG/2ML IJ SOLN
INTRAMUSCULAR | Status: AC
  Filled 2018-02-22: qty 2

## 2018-02-22 MED ORDER — BISACODYL 10 MG RE SUPP: 10.0000 mg | Freq: Every day | RECTAL | Status: DC | PRN

## 2018-02-22 MED ORDER — SODIUM CHLORIDE 0.9 % IV SOLN
INTRAVENOUS | Status: DC | PRN
  Administered 2018-02-22: 25 ug/min via INTRAVENOUS

## 2018-02-22 MED ORDER — SODIUM CHLORIDE 0.9 % IV SOLN
INTRAVENOUS | Status: DC
  Administered 2018-02-22: 75 mL/h via INTRAVENOUS
  Administered 2018-02-23: 05:00:00 via INTRAVENOUS

## 2018-02-22 MED ORDER — FENTANYL CITRATE (PF) 100 MCG/2ML IJ SOLN
50.0000 ug | INTRAMUSCULAR | Status: DC
  Administered 2018-02-22: 50 ug via INTRAVENOUS
  Filled 2018-02-22: qty 2

## 2018-02-22 MED ORDER — OXYCODONE HCL 5 MG/5ML PO SOLN
5.0000 mg | Freq: Once | ORAL | Status: DC | PRN
  Filled 2018-02-22: qty 5

## 2018-02-22 MED ORDER — METHOCARBAMOL 500 MG PO TABS
500.0000 mg | ORAL_TABLET | Freq: Four times a day (QID) | ORAL | Status: DC | PRN
  Administered 2018-02-22 – 2018-02-24 (×7): 500 mg via ORAL
  Filled 2018-02-22 (×8): qty 1

## 2018-02-22 MED ORDER — CEFAZOLIN SODIUM-DEXTROSE 2-4 GM/100ML-% IV SOLN
2.0000 g | INTRAVENOUS | Status: AC
  Administered 2018-02-22: 2 g via INTRAVENOUS
  Filled 2018-02-22: qty 100

## 2018-02-22 MED ORDER — DEXAMETHASONE SODIUM PHOSPHATE 10 MG/ML IJ SOLN
10.0000 mg | Freq: Once | INTRAMUSCULAR | Status: AC
  Administered 2018-02-23: 10 mg via INTRAVENOUS
  Filled 2018-02-22: qty 1

## 2018-02-22 MED ORDER — SODIUM CHLORIDE (PF) 0.9 % IJ SOLN
INTRAMUSCULAR | Status: AC
  Filled 2018-02-22: qty 50

## 2018-02-22 MED ORDER — SODIUM CHLORIDE (PF) 0.9 % IJ SOLN
INTRAMUSCULAR | Status: DC | PRN
  Administered 2018-02-22: 60 mL

## 2018-02-22 MED ORDER — MODAFINIL 200 MG PO TABS
100.0000 mg | ORAL_TABLET | Freq: Every day | ORAL | Status: DC
  Administered 2018-02-24: 100 mg via ORAL
  Filled 2018-02-22 (×2): qty 1

## 2018-02-22 MED ORDER — GABAPENTIN 300 MG PO CAPS
300.0000 mg | ORAL_CAPSULE | Freq: Three times a day (TID) | ORAL | Status: DC
  Administered 2018-02-22 – 2018-02-24 (×6): 300 mg via ORAL
  Filled 2018-02-22 (×6): qty 1

## 2018-02-22 MED ORDER — GABAPENTIN 300 MG PO CAPS
300.0000 mg | ORAL_CAPSULE | Freq: Once | ORAL | Status: AC
  Administered 2018-02-22: 200 mg via ORAL

## 2018-02-22 MED ORDER — SUMATRIPTAN SUCCINATE 50 MG PO TABS
100.0000 mg | ORAL_TABLET | ORAL | Status: DC | PRN
  Filled 2018-02-22: qty 2

## 2018-02-22 MED ORDER — LACTATED RINGERS IV SOLN
INTRAVENOUS | Status: DC
  Administered 2018-02-22 (×2): via INTRAVENOUS

## 2018-02-22 MED ORDER — PANTOPRAZOLE SODIUM 40 MG PO TBEC
40.0000 mg | DELAYED_RELEASE_TABLET | Freq: Every day | ORAL | Status: DC
  Administered 2018-02-22 – 2018-02-24 (×3): 40 mg via ORAL
  Filled 2018-02-22 (×3): qty 1

## 2018-02-22 MED ORDER — LEVOTHYROXINE SODIUM 100 MCG PO TABS
100.0000 ug | ORAL_TABLET | Freq: Every day | ORAL | Status: DC
  Administered 2018-02-24: 100 ug via ORAL
  Filled 2018-02-22 (×2): qty 1

## 2018-02-22 MED ORDER — PHENYLEPHRINE HCL 10 MG/ML IJ SOLN
INTRAMUSCULAR | Status: AC
  Filled 2018-02-22: qty 1

## 2018-02-22 MED ORDER — METOCLOPRAMIDE HCL 5 MG PO TABS: 5.0000 mg | ORAL_TABLET | Freq: Three times a day (TID) | ORAL | Status: DC | PRN

## 2018-02-22 MED ORDER — METOCLOPRAMIDE HCL 5 MG/ML IJ SOLN: 5.0000 mg | Freq: Three times a day (TID) | INTRAMUSCULAR | Status: DC | PRN

## 2018-02-22 MED ORDER — MORPHINE SULFATE (PF) 2 MG/ML IV SOLN
1.0000 mg | INTRAVENOUS | Status: DC | PRN
  Administered 2018-02-22: 1 mg via INTRAVENOUS
  Administered 2018-02-22: 2 mg via INTRAVENOUS
  Administered 2018-02-22 (×2): 1 mg via INTRAVENOUS
  Administered 2018-02-23 (×4): 2 mg via INTRAVENOUS
  Filled 2018-02-22 (×7): qty 1

## 2018-02-22 MED ORDER — HYDROMORPHONE HCL 2 MG PO TABS
2.0000 mg | ORAL_TABLET | ORAL | Status: DC | PRN
  Administered 2018-02-22: 2 mg via ORAL
  Administered 2018-02-23: 4 mg via ORAL
  Filled 2018-02-22: qty 2
  Filled 2018-02-22: qty 1

## 2018-02-22 MED ORDER — PROPOFOL 500 MG/50ML IV EMUL
INTRAVENOUS | Status: DC | PRN
  Administered 2018-02-22: 75 ug/kg/min via INTRAVENOUS

## 2018-02-22 MED ORDER — OXYCODONE HCL 5 MG PO TABS: 5.0000 mg | ORAL_TABLET | Freq: Once | ORAL | Status: DC | PRN

## 2018-02-22 MED ORDER — OXYCODONE HCL 5 MG PO TABS
5.0000 mg | ORAL_TABLET | ORAL | Status: DC | PRN
  Administered 2018-02-22: 10 mg via ORAL
  Filled 2018-02-22: qty 1

## 2018-02-22 MED ORDER — CYCLOBENZAPRINE HCL 5 MG PO TABS
2.5000 mg | ORAL_TABLET | Freq: Every day | ORAL | Status: DC
  Administered 2018-02-22 – 2018-02-23 (×2): 2.5 mg via ORAL
  Filled 2018-02-22 (×2): qty 1

## 2018-02-22 MED ORDER — FENTANYL CITRATE (PF) 100 MCG/2ML IJ SOLN: 25.0000 ug | INTRAMUSCULAR | Status: DC | PRN

## 2018-02-22 MED ORDER — DOCUSATE SODIUM 100 MG PO CAPS
100.0000 mg | ORAL_CAPSULE | Freq: Two times a day (BID) | ORAL | Status: DC
  Administered 2018-02-23 – 2018-02-24 (×3): 100 mg via ORAL
  Filled 2018-02-22 (×4): qty 1

## 2018-02-22 MED ORDER — POLYETHYLENE GLYCOL 3350 17 G PO PACK
17.0000 g | PACK | Freq: Every day | ORAL | Status: DC | PRN
  Administered 2018-02-24: 17 g via ORAL
  Filled 2018-02-22: qty 1

## 2018-02-22 MED ORDER — MECLIZINE HCL 25 MG PO TABS: 25.0000 mg | ORAL_TABLET | Freq: Three times a day (TID) | ORAL | Status: DC | PRN

## 2018-02-22 MED ORDER — MIDAZOLAM HCL 2 MG/2ML IJ SOLN
1.0000 mg | INTRAMUSCULAR | Status: DC
  Administered 2018-02-22 (×3): 1 mg via INTRAVENOUS
  Filled 2018-02-22: qty 2

## 2018-02-22 MED ORDER — BUPIVACAINE LIPOSOME 1.3 % IJ SUSP
INTRAMUSCULAR | Status: DC | PRN
  Administered 2018-02-22: 20 mL

## 2018-02-22 MED ORDER — DEXAMETHASONE SODIUM PHOSPHATE 10 MG/ML IJ SOLN
8.0000 mg | Freq: Once | INTRAMUSCULAR | Status: AC
  Administered 2018-02-22: 8 mg via INTRAVENOUS

## 2018-02-22 MED ORDER — CHLORHEXIDINE GLUCONATE 4 % EX LIQD: 60.0000 mL | Freq: Once | CUTANEOUS | Status: DC

## 2018-02-22 MED ORDER — FLEET ENEMA 7-19 GM/118ML RE ENEM: 1.0000 | ENEMA | Freq: Once | RECTAL | Status: DC | PRN

## 2018-02-22 MED ORDER — OXYCODONE HCL 5 MG PO TABS
10.0000 mg | ORAL_TABLET | ORAL | Status: DC | PRN
  Administered 2018-02-22: 5 mg via ORAL
  Filled 2018-02-22: qty 2

## 2018-02-22 MED ORDER — DIPHENHYDRAMINE HCL 12.5 MG/5ML PO ELIX: 12.5000 mg | ORAL_SOLUTION | ORAL | Status: DC | PRN

## 2018-02-22 MED ORDER — PHENOL 1.4 % MT LIQD: 1.0000 | OROMUCOSAL | Status: DC | PRN

## 2018-02-22 MED ORDER — PROPOFOL 10 MG/ML IV BOLUS
INTRAVENOUS | Status: AC
  Filled 2018-02-22: qty 40

## 2018-02-22 MED ORDER — ONDANSETRON HCL 4 MG PO TABS: 4.0000 mg | ORAL_TABLET | Freq: Four times a day (QID) | ORAL | Status: DC | PRN

## 2018-02-22 MED ORDER — ONDANSETRON HCL 4 MG/2ML IJ SOLN
INTRAMUSCULAR | Status: DC | PRN
  Administered 2018-02-22: 4 mg via INTRAVENOUS

## 2018-02-22 MED ORDER — MIDAZOLAM HCL 2 MG/2ML IJ SOLN
INTRAMUSCULAR | Status: AC
  Filled 2018-02-22: qty 2

## 2018-02-22 MED ORDER — MODAFINIL 200 MG PO TABS: 100.0000 mg | ORAL_TABLET | Freq: Every day | ORAL | Status: DC | PRN

## 2018-02-22 MED ORDER — PROMETHAZINE HCL 25 MG/ML IJ SOLN: 6.2500 mg | INTRAMUSCULAR | Status: DC | PRN

## 2018-02-22 MED ORDER — ASPIRIN EC 325 MG PO TBEC
325.0000 mg | DELAYED_RELEASE_TABLET | Freq: Two times a day (BID) | ORAL | Status: DC
  Administered 2018-02-23 – 2018-02-24 (×3): 325 mg via ORAL
  Filled 2018-02-22 (×3): qty 1

## 2018-02-22 MED ORDER — SODIUM CHLORIDE 0.9 % IR SOLN
Status: DC | PRN
  Administered 2018-02-22: 1000 mL

## 2018-02-22 SURGICAL SUPPLY — 61 items
ATTUNE MED DOME PAT 32 KNEE (Knees) ×1 IMPLANT
ATTUNE PSFEM LTSZ3 NARCEM KNEE (Femur) ×1 IMPLANT
ATTUNE PSRP INSR SZ3 12 KNEE (Insert) ×1 IMPLANT
BAG SPEC THK2 15X12 ZIP CLS (MISCELLANEOUS) ×1
BAG ZIPLOCK 12X15 (MISCELLANEOUS) ×2 IMPLANT
BANDAGE ACE 6X5 VEL STRL LF (GAUZE/BANDAGES/DRESSINGS) ×2 IMPLANT
BASE TIBIAL ROT PLAT SZ 3 KNEE (Knees) IMPLANT
BLADE SAG 18X100X1.27 (BLADE) ×2 IMPLANT
BLADE SAW SGTL 11.0X1.19X90.0M (BLADE) ×2 IMPLANT
BLADE SURG SZ10 CARB STEEL (BLADE) ×4 IMPLANT
BOWL SMART MIX CTS (DISPOSABLE) ×2 IMPLANT
BSPLAT TIB 3 CMNT ROT PLAT STR (Knees) ×1 IMPLANT
CEMENT HV SMART SET (Cement) ×4 IMPLANT
CLSR STERI-STRIP ANTIMIC 1/2X4 (GAUZE/BANDAGES/DRESSINGS) ×1 IMPLANT
COVER SURGICAL LIGHT HANDLE (MISCELLANEOUS) ×2 IMPLANT
COVER WAND RF STERILE (DRAPES) ×1 IMPLANT
CUFF TOURN SGL QUICK 34 (TOURNIQUET CUFF) ×2
CUFF TRNQT CYL 34X4X40X1 (TOURNIQUET CUFF) ×1 IMPLANT
DECANTER SPIKE VIAL GLASS SM (MISCELLANEOUS) ×2 IMPLANT
DRAPE LG THREE QUARTER DISP (DRAPES) ×1 IMPLANT
DRAPE U-SHAPE 47X51 STRL (DRAPES) ×2 IMPLANT
DRSG ADAPTIC 3X8 NADH LF (GAUZE/BANDAGES/DRESSINGS) ×2 IMPLANT
DRSG PAD ABDOMINAL 8X10 ST (GAUZE/BANDAGES/DRESSINGS) ×2 IMPLANT
DURAPREP 26ML APPLICATOR (WOUND CARE) ×2 IMPLANT
ELECT REM PT RETURN 15FT ADLT (MISCELLANEOUS) ×2 IMPLANT
EVACUATOR 1/8 PVC DRAIN (DRAIN) ×2 IMPLANT
GAUZE SPONGE 4X4 12PLY STRL (GAUZE/BANDAGES/DRESSINGS) ×2 IMPLANT
GLOVE BIO SURGEON STRL SZ7 (GLOVE) ×1 IMPLANT
GLOVE BIO SURGEON STRL SZ8 (GLOVE) ×2 IMPLANT
GLOVE BIOGEL PI IND STRL 6.5 (GLOVE) ×1 IMPLANT
GLOVE BIOGEL PI IND STRL 7.0 (GLOVE) ×1 IMPLANT
GLOVE BIOGEL PI IND STRL 8 (GLOVE) ×1 IMPLANT
GLOVE BIOGEL PI INDICATOR 6.5 (GLOVE) ×1
GLOVE BIOGEL PI INDICATOR 7.0 (GLOVE)
GLOVE BIOGEL PI INDICATOR 8 (GLOVE) ×1
GLOVE SURG SS PI 6.5 STRL IVOR (GLOVE) ×2 IMPLANT
GOWN STRL REUS W/TWL LRG LVL3 (GOWN DISPOSABLE) ×4 IMPLANT
GOWN STRL REUS W/TWL XL LVL3 (GOWN DISPOSABLE) ×2 IMPLANT
HANDPIECE INTERPULSE COAX TIP (DISPOSABLE) ×2
HOLDER FOLEY CATH W/STRAP (MISCELLANEOUS) IMPLANT
IMMOBILIZER KNEE 20 (SOFTGOODS) ×3 IMPLANT
IMMOBILIZER KNEE 20 THIGH 36 (SOFTGOODS) ×1 IMPLANT
MANIFOLD NEPTUNE II (INSTRUMENTS) ×2 IMPLANT
PACK TOTAL KNEE CUSTOM (KITS) ×2 IMPLANT
PAD ABD 8X10 STRL (GAUZE/BANDAGES/DRESSINGS) ×1 IMPLANT
PADDING CAST COTTON 6X4 STRL (CAST SUPPLIES) ×4 IMPLANT
PIN STEINMAN FIXATION KNEE (PIN) ×1 IMPLANT
PROTECTOR NERVE ULNAR (MISCELLANEOUS) ×2 IMPLANT
SET HNDPC FAN SPRY TIP SCT (DISPOSABLE) ×1 IMPLANT
STRIP CLOSURE SKIN 1/2X4 (GAUZE/BANDAGES/DRESSINGS) ×4 IMPLANT
SUT ETHIBOND NAB CT1 #1 30IN (SUTURE) ×1 IMPLANT
SUT MNCRL AB 4-0 PS2 18 (SUTURE) ×2 IMPLANT
SUT STRATAFIX 0 PDS 27 VIOLET (SUTURE) ×2
SUT VIC AB 2-0 CT1 27 (SUTURE) ×6
SUT VIC AB 2-0 CT1 TAPERPNT 27 (SUTURE) ×3 IMPLANT
SUTURE STRATFX 0 PDS 27 VIOLET (SUTURE) ×1 IMPLANT
TIBIAL BASE ROT PLAT SZ 3 KNEE (Knees) ×2 IMPLANT
TRAY FOLEY MTR SLVR 16FR STAT (SET/KITS/TRAYS/PACK) ×2 IMPLANT
WATER STERILE IRR 1000ML POUR (IV SOLUTION) ×4 IMPLANT
WRAP KNEE MAXI GEL POST OP (GAUZE/BANDAGES/DRESSINGS) ×2 IMPLANT
YANKAUER SUCT BULB TIP 10FT TU (MISCELLANEOUS) ×2 IMPLANT

## 2018-02-22 NOTE — Anesthesia Preprocedure Evaluation (Addendum)
Anesthesia Evaluation  Patient identified by MRN, date of birth, ID band Patient awake    Reviewed: Allergy & Precautions, NPO status , Patient's Chart, lab work & pertinent test results  History of Anesthesia Complications (+) PONV and history of anesthetic complications  Airway Mallampati: II  TM Distance: >3 FB Neck ROM: Full    Dental  (+) Teeth Intact, Dental Advisory Given   Pulmonary neg pulmonary ROS,    Pulmonary exam normal breath sounds clear to auscultation       Cardiovascular negative cardio ROS Normal cardiovascular exam Rhythm:Regular Rate:Normal     Neuro/Psych  Headaches, Anxiety Depression    GI/Hepatic Neg liver ROS, GERD  Medicated and Controlled,  Endo/Other  Hypothyroidism   Renal/GU negative Renal ROS     Musculoskeletal  (+) Arthritis , Fibromyalgia -, narcotic dependent  Abdominal   Peds  Hematology negative hematology ROS (+)   Anesthesia Other Findings Day of surgery medications reviewed with the patient.  Reproductive/Obstetrics                            Anesthesia Physical Anesthesia Plan  ASA: II  Anesthesia Plan: Spinal   Post-op Pain Management:  Regional for Post-op pain   Induction:   PONV Risk Score and Plan: 3 and Treatment may vary due to age or medical condition, Ondansetron, Propofol infusion, Dexamethasone and Midazolam  Airway Management Planned: Natural Airway and Simple Face Mask  Additional Equipment:   Intra-op Plan:   Post-operative Plan:   Informed Consent: I have reviewed the patients History and Physical, chart, labs and discussed the procedure including the risks, benefits and alternatives for the proposed anesthesia with the patient or authorized representative who has indicated his/her understanding and acceptance.     Dental advisory given  Plan Discussed with: CRNA  Anesthesia Plan Comments: (Spinal + adductor  canal block)       Anesthesia Quick Evaluation

## 2018-02-22 NOTE — Evaluation (Signed)
Physical Therapy Evaluation Patient Details Name: Claudia Luna MRN: [id number] DOB: Dec 31, 1955 Today's Date: 02/22/2018   History of Present Illness  63 yo female s/p L TKR on 02/22/18. PMH includes OA, chronic pain and fibromyalgia, chronic narcotic dependence, essential tremor, vertigo, anxiety, depression.   Clinical Impression   Pt presents with L knee pain, difficulty performing bed mobility/transfers, decreased tolerance for ambulation due to L knee pain. Pt to benefit from acute PT to address deficits. Pt ambulated 20 ft with RW with min guard assist, limited by R knee pain that was increasing with further ambulation distance. Pt educated on ankle pumps (20/hour) to perform this afternoon/evening to increase circulation, to pt's tolerance and limited by pain. PT to progress mobility as tolerated, and will continue to follow acutely.        Follow Up Recommendations Follow surgeon's recommendation for DC plan and follow-up therapies;Supervision for mobility/OOB    Equipment Recommendations  None recommended by PT    Recommendations for Other Services       Precautions / Restrictions Precautions Precautions: Fall Required Braces or Orthoses: Knee Immobilizer - Left Knee Immobilizer - Left: On when out of bed or walking;Discontinue once straight leg raise with < 10 degree lag Restrictions Weight Bearing Restrictions: No Other Position/Activity Restrictions: WBAT       Mobility  Bed Mobility Overal bed mobility: Needs Assistance Bed Mobility: Supine to Sit     Supine to sit: HOB elevated;Min assist     General bed mobility comments: Min assist for LLE lifting and translation to EOB. increased time and effort, verbal cuing provided throughout. Pt soiled with BM upon sitting EOB.   Transfers Overall transfer level: Needs assistance Equipment used: Rolling walker (2 wheeled) Transfers: Sit to/from [employer];Stand Pivot Transfers Sit to Stand: Min assist Stand pivot  transfers: Min assist       General transfer comment: Min [facility] placement when rising, sequencing. STS x3, once from bed, [facility], and once from bed again because PT had to change pt's gown.   Ambulation/Gait Ambulation/Gait assistance: Min guard;+2 safety/equipment(husband following along with IV ) Gait Distance (Feet): 20 Feet Assistive device: Rolling walker (2 wheeled) Gait Pattern/deviations: Step-to pattern;Decreased stance time - left;Decreased weight shift to left;Antalgic Gait velocity: decr    General Gait Details: Min guard for safety. Verbal cuing for placement in RW, sequencing, turning.   Stairs            Wheelchair Mobility    Modified Rankin (Stroke Patients Only)       Balance Overall balance assessment: Mild deficits observed, not formally tested                                           Pertinent Vitals/Pain Pain Assessment: 0-10 Pain Score: 5  Pain Location: L knee, with ambulation  Pain Descriptors / Indicators: Sore Pain Intervention(s): Limited activity within patient's tolerance;Repositioned;Ice applied;Monitored during session;Premedicated before session    Home Living Family/patient expects to be discharged to:: Private residence Living Arrangements: Spouse/significant other Available Help at Discharge: Family Type of Home: House Home Access: Stairs to enter Entrance Stairs-Rails: None Entrance Stairs-Number of Steps: 2 Home Layout: Two level;Able to live on main level with bedroom/bathroom Home Equipment: Dan HumphreysWalker - 2 wheels;Other (comment)(CPM )      Prior Function Level of Independence: Independent  Hand Dominance   Dominant Hand: Right    Extremity/Trunk Assessment   Upper Extremity Assessment Upper Extremity Assessment: Overall WFL for tasks assessed    Lower Extremity Assessment Lower Extremity Assessment: Overall WFL for tasks assessed;LLE  deficits/detail LLE Deficits / Details: suspected post-surgical weakness; able to perform ankle pumps, quad set, SLR with lift assist  LLE Sensation: WNL    Cervical / Trunk Assessment Cervical / Trunk Assessment: Normal  Communication   Communication: No difficulties  Cognition Arousal/Alertness: Awake/alert Behavior During Therapy: WFL for tasks assessed/performed Overall Cognitive Status: Within Functional Limits for tasks assessed                                        General Comments      Exercises     Assessment/Plan    PT Assessment Patient needs continued PT services  PT Problem List Decreased strength;Pain;Decreased range of motion;Decreased activity tolerance;Decreased knowledge of use of DME;Decreased balance;Decreased mobility       PT Treatment Interventions DME instruction;Therapeutic activities;Gait training;Therapeutic exercise;Patient/family education;Stair training;Functional mobility training    PT Goals (Current goals can be found in the Care Plan section)  Acute Rehab PT Goals Patient Stated Goal: none stated  PT Goal Formulation: With patient Time For Goal Achievement: 03/01/18 Potential to Achieve Goals: Good    Frequency 7X/week   Barriers to discharge        Co-evaluation               AM-PAC PT "6 Clicks" Mobility  Outcome Measure Help needed turning from your back to your side while in a flat bed without using bedrails?: A Little Help needed moving from lying on your back to sitting on the side of a flat bed without using bedrails?: A Little Help needed moving to and from a bed to a chair (including a wheelchair)?: A Little Help needed standing up from a chair using your arms (e.g., wheelchair or bedside chair)?: A Little Help needed to walk in hospital room?: A Little Help needed climbing 3-5 steps with a railing? : A Little 6 Click Score: 18    End of Session Equipment Utilized During Treatment: Gait  belt Activity Tolerance: Patient tolerated treatment well;Patient limited by pain Patient left: in chair;with chair alarm set;with call bell/phone within reach;with family/visitor present;with nursing/sitter in room(RN and NT to plug pt into SCDs, pulse ox) Nurse Communication: Mobility status PT Visit Diagnosis: Other abnormalities of gait and mobility (R26.89);Difficulty in walking, not elsewhere classified (R26.2)    Time: 5038-8828 PT Time Calculation (min) (ACUTE ONLY): 25 min   Charges:   PT Evaluation $PT Eval Low Complexity: 1 Low PT Treatments $Gait Training: 8-22 mins        Nicola Police, PT Acute Rehabilitation Services Pager [phone]  Office [phone]  Demario Faniel D Despina Hidden 02/22/2018, 7:19 PM

## 2018-02-22 NOTE — Progress Notes (Signed)
Assisted Dr.Howze  with left knee adductor canal block. Side rails up, monitors on throughout procedure. See vital signs in flow sheet. Tolerated Procedure well.  

## 2018-02-22 NOTE — Anesthesia Procedure Notes (Signed)
Date/Time: 02/22/2018 12:42 PM Performed by: Thornell Mule, CRNA Oxygen Delivery Method: Nasal cannula

## 2018-02-22 NOTE — Anesthesia Procedure Notes (Signed)
Spinal  Patient location during procedure: OR Start time: 02/22/2018 12:46 PM End time: 02/22/2018 12:49 PM Staffing Anesthesiologist: Brennan Bailey, MD Resident/CRNA: Glory Buff, CRNA Performed: resident/CRNA  Preanesthetic Checklist Completed: patient identified, site marked, surgical consent, pre-op evaluation, timeout performed, IV checked, risks and benefits discussed and monitors and equipment checked Spinal Block Patient position: sitting Prep: DuraPrep Patient monitoring: heart rate, continuous pulse ox and blood pressure Approach: midline Location: L3-4 Injection technique: single-shot Needle Needle type: Pencan  Needle gauge: 24 G Needle length: 9 cm Needle insertion depth: 5 cm Assessment Sensory level: T6 Additional Notes Kit expiration date checked and verified.  Sterile prep and drape, skin local with 1% lidocaine, stick x 1, - paraesthesia, - heme, + CSF pre and post injection.  Patient tolerated procedure well.

## 2018-02-22 NOTE — Transfer of Care (Signed)
Immediate Anesthesia Transfer of Care Note  Patient: Claudia Luna  Procedure(s) Performed: TOTAL KNEE ARTHROPLASTY (Left )  Patient Location: PACU  Anesthesia Type:MAC and Spinal  Level of Consciousness: awake, alert  and oriented  Airway & Oxygen Therapy: Patient Spontanous Breathing and Patient connected to face mask oxygen  Post-op Assessment: Report given to RN and Post -op Vital signs reviewed and stable  Post vital signs: Reviewed and stable  Last Vitals:  Vitals Value Taken Time  BP    Temp    Pulse    Resp    SpO2      Last Pain:  Vitals:   02/22/18 1020  TempSrc:   PainSc: 3       Patients Stated Pain Goal: 3 (69/48/54 6270)  Complications: No apparent anesthesia complications

## 2018-02-22 NOTE — Plan of Care (Signed)

## 2018-02-22 NOTE — Op Note (Signed)
OPERATIVE REPORT-TOTAL KNEE ARTHROPLASTY   Pre-operative diagnosis- Osteoarthritis  Left knee(s)  Post-operative diagnosis- Osteoarthritis Left knee(s)  Procedure-  Left  Total Knee Arthroplasty  Surgeon- Gus Rankin. Anacarolina Evelyn, MD  Assistant- Dimitri Ped, PA-C   Anesthesia-  Adductor canal block and spinal  EBL-25 ml   Drains Hemovac  Tourniquet time-  Total Tourniquet Time Documented: Thigh (Left) - 45 minutes Total: Thigh (Left) - 45 minutes     Complications- None  Condition-PACU - hemodynamically stable.   Brief Clinical Note  Claudia Luna is a 63 y.o. year old female with end stage OA of her left knee with progressively worsening pain and dysfunction. She has constant pain, with activity and at rest and significant functional deficits with difficulties even with ADLs. She has had extensive non-op management including analgesics, injections of cortisone and viscosupplements, and home exercise program, but remains in significant pain with significant dysfunction. Radiographs show bone on bone arthritis medial and patellofemoral. She presents now for left Total Knee Arthroplasty.    Procedure in detail---   The patient is brought into the operating room and positioned supine on the operating table. After successful administration of  Adductor canal block and spinal,   a tourniquet is placed high on the  Left thigh(s) and the lower extremity is prepped and draped in the usual sterile fashion. Time out is performed by the operating team and then the  Left lower extremity is wrapped in Esmarch, knee flexed and the tourniquet inflated to 300 mmHg.       A midline incision is made with a ten blade through the subcutaneous tissue to the level of the extensor mechanism. A fresh blade is used to make a medial parapatellar arthrotomy. Soft tissue over the proximal medial tibia is subperiosteally elevated to the joint line with a knife and into the semimembranosus bursa with a  Cobb elevator. Soft tissue over the proximal lateral tibia is elevated with attention being paid to avoiding the patellar tendon on the tibial tubercle. The patella is everted, knee flexed 90 degrees and the ACL and PCL are removed. Findings are bone on bone medial, central and patellofemoral.        The drill is used to create a starting hole in the distal femur and the canal is thoroughly irrigated with sterile saline to remove the fatty contents. The 5 degree Left  valgus alignment guide is placed into the femoral canal and the distal femoral cutting block is pinned to remove 9 mm off the distal femur. Resection is made with an oscillating saw.      The tibia is subluxed forward and the menisci are removed. The extramedullary alignment guide is placed referencing proximally at the medial aspect of the tibial tubercle and distally along the second metatarsal axis and tibial crest. The block is pinned to remove 35mm off the more deficient medial  side. Resection is made with an oscillating saw. Size 3is the most appropriate size for the tibia and the proximal tibia is prepared with the modular drill and keel punch for that size.      The femoral sizing guide is placed and size 3 is most appropriate. Rotation is marked off the epicondylar axis and confirmed by creating a rectangular flexion gap at 90 degrees. The size 3 cutting block is pinned in this rotation and the anterior, posterior and chamfer cuts are made with the oscillating saw. The intercondylar block is then placed and that cut is made.  Trial size 3 tibial component, trial size 3 posterior stabilized femur and a 12  mm posterior stabilized rotating platform insert trial is placed. Full extension is achieved with excellent varus/valgus and anterior/posterior balance throughout full range of motion. The patella is everted and thickness measured to be 22  mm. Free hand resection is taken to 12 mm, a 32 template is placed, lug holes are drilled,  trial patella is placed, and it tracks normally. Osteophytes are removed off the posterior femur with the trial in place. All trials are removed and the cut bone surfaces prepared with pulsatile lavage. Cement is mixed and once ready for implantation, the size 3 tibial implant, size  3 posterior stabilized femoral component, and the size 32 patella are cemented in place and the patella is held with the clamp. The trial insert is placed and the knee held in full extension. The Exparel (20 ml mixed with 60 ml saline) is injected into the extensor mechanism, posterior capsule, medial and lateral gutters and subcutaneous tissues.  All extruded cement is removed and once the cement is hard the permanent 12 mm posterior stabilized rotating platform insert is placed into the tibial tray.      The wound is copiously irrigated with saline solution and the extensor mechanism closed over a hemovac drain with #1 V-loc suture. The tourniquet is released for a total tourniquet time of 43  minutes. Flexion against gravity is 140 degrees and the patella tracks normally. Subcutaneous tissue is closed with 2.0 vicryl and subcuticular with running 4.0 Monocryl. The incision is cleaned and dried and steri-strips and a bulky sterile dressing are applied. The limb is placed into a knee immobilizer and the patient is awakened and transported to recovery in stable condition.      Please note that a surgical assistant was a medical necessity for this procedure in order to perform it in a safe and expeditious manner. Surgical assistant was necessary to retract the ligaments and vital neurovascular structures to prevent injury to them and also necessary for proper positioning of the limb to allow for anatomic placement of the prosthesis.   Gus Rankin Robina Hamor, MD    02/22/2018, 1:58 PM

## 2018-02-22 NOTE — Interval H&P Note (Signed)
History and Physical Interval Note:  02/22/2018 10:23 AM  Claudia Luna  has presented today for surgery, with the diagnosis of left knee osteoarthritis  The various methods of treatment have been discussed with the patient and family. After consideration of risks, benefits and other options for treatment, the patient has consented to  Procedure(s) with comments: TOTAL KNEE ARTHROPLASTY (Left) - as a surgical intervention .  The patient's history has been reviewed, patient examined, no change in status, stable for surgery.  I have reviewed the patient's chart and labs.  Questions were answered to the patient's satisfaction.     Homero Fellers Sha Burling

## 2018-02-22 NOTE — Anesthesia Postprocedure Evaluation (Signed)
Anesthesia Post Note  Patient: Claudia Luna  Procedure(s) Performed: TOTAL KNEE ARTHROPLASTY (Left )     Patient location during evaluation: PACU Anesthesia Type: Spinal Level of consciousness: awake and alert Pain management: pain level controlled Vital Signs Assessment: post-procedure vital signs reviewed and stable Respiratory status: spontaneous breathing, nonlabored ventilation and respiratory function stable Cardiovascular status: blood pressure returned to baseline and stable Postop Assessment: no apparent nausea or vomiting and spinal receding Anesthetic complications: no    Last Vitals:  Vitals:   02/22/18 1542 02/22/18 1603  BP: 102/66 93/65  Pulse: 98 95  Resp: 17 14  Temp: 37 C 37.1 C  SpO2: 94% 99%    Last Pain:  Vitals:   02/22/18 1603  TempSrc: Oral  PainSc:                  [name]

## 2018-02-22 NOTE — Anesthesia Procedure Notes (Addendum)
Anesthesia Regional Block: Adductor canal block   Pre-Anesthetic Checklist: ,, timeout performed, Correct Patient, Correct Site, Correct Laterality, Correct Procedure, Correct Position, site marked, Risks and benefits discussed, pre-op evaluation,  At surgeon's request and post-op pain management  Laterality: Left  Prep: Maximum Sterile Barrier Precautions used, chloraprep       Needles:  Injection technique: Single-shot  Needle Type: Echogenic Stimulator Needle     Needle Length: 9cm  Needle Gauge: 22     Additional Needles:   Procedures:,,,, ultrasound used (permanent image in chart),,,,  Narrative:  Start time: 02/22/2018 12:18 PM End time: 02/22/2018 12:20 PM Injection made incrementally with aspirations every 5 mL.  Performed by: Personally  Anesthesiologist: [name], MD  Additional Notes: Risks, benefits, and alternative discussed. Patient gave consent for procedure. Patient prepped and draped in sterile fashion. Sedation administered, patient remains easily responsive to voice. Relevant anatomy identified with ultrasound guidance. Local anesthetic given in 5cc increments with no signs or symptoms of intravascular injection. No pain or paraesthesias with injection. Patient monitored throughout procedure with signs of LAST or immediate complications. Tolerated well. Ultrasound image placed in chart.  Amalia Greenhouse, MD

## 2018-02-22 NOTE — Discharge Instructions (Signed)
° °Dr. Frank Aluisio °Total Joint Specialist °[facility] °[address] °[place], [place] [address] °[phone] ° °TOTAL KNEE REPLACEMENT POSTOPERATIVE DIRECTIONS ° °Knee Rehabilitation, Guidelines Following Surgery  °Results after knee surgery are often greatly improved when you follow the exercise, range of motion and muscle strengthening exercises prescribed by your doctor. Safety measures are also important to protect the knee from further injury. Any time any of these exercises cause you to have increased pain or swelling in your knee joint, decrease the amount until you are comfortable again and slowly increase them. If you have problems or questions, call your caregiver or physical therapist for advice.  ° °HOME CARE INSTRUCTIONS  °• Remove items at home which could result in a fall. This includes throw rugs or furniture in walking pathways.  °· ICE to the affected knee every three hours for 30 minutes at a time and then as needed for pain and swelling.  Continue to use ice on the knee for pain and swelling from surgery. You may notice swelling that will progress down to the foot and ankle.  This is normal after surgery.  Elevate the leg when you are not up walking on it.   °· Continue to use the breathing machine which will help keep your temperature down.  It is common for your temperature to cycle up and down following surgery, especially at night when you are not up moving around and exerting yourself.  The breathing machine keeps your lungs expanded and your temperature down. °· Do not place pillow under knee, focus on keeping the knee straight while resting ° °DIET °You may resume your previous home diet once your are discharged from the hospital. ° °DRESSING / WOUND CARE / SHOWERING °You may shower 3 days after surgery, but keep the wounds dry during showering.  You may use an occlusive plastic wrap (Press'n Seal for example), NO SOAKING/SUBMERGING IN THE BATHTUB.  If the bandage  gets wet, change with a clean dry gauze.  If the incision gets wet, pat the wound dry with a clean towel. °You may start showering once you are discharged home but do not submerge the incision under water. Just pat the incision dry and apply a dry gauze dressing on daily. °Change the surgical dressing daily and reapply a dry dressing each time. ° °ACTIVITY °Walk with your walker as instructed. °Use walker as long as suggested by your caregivers. °Avoid periods of inactivity such as sitting longer than an hour when not asleep. This helps prevent blood clots.  °You may resume a sexual relationship in one month or when given the OK by your doctor.  °You may return to work once you are cleared by your doctor.  °Do not drive a car for 6 weeks or until released by you surgeon.  °Do not drive while taking narcotics. ° °WEIGHT BEARING °Weight bearing as tolerated with assist device (walker, cane, etc) as directed, use it as long as suggested by your surgeon or therapist, typically at least 4-6 weeks. ° °POSTOPERATIVE CONSTIPATION PROTOCOL °Constipation - defined medically as fewer than three stools per week and severe constipation as less than one stool per week. ° °One of the most common issues patients have following surgery is constipation.  Even if you have a regular bowel pattern at home, your normal regimen is likely to be disrupted due to multiple reasons following surgery.  Combination of anesthesia, postoperative narcotics, change in appetite and fluid intake all can affect your bowels.    In order to avoid complications following surgery, here are some recommendations in order to help you during your recovery period. ° °Colace (docusate) - Pick up an over-the-counter form of Colace or another stool softener and take twice a day as long as you are requiring postoperative pain medications.  Take with a full glass of water daily.  If you experience loose stools or diarrhea, hold the colace until you stool forms back  up.  If your symptoms do not get better within 1 week or if they get worse, check with your doctor. ° °Dulcolax (bisacodyl) - Pick up over-the-counter and take as directed by the product packaging as needed to assist with the movement of your bowels.  Take with a full glass of water.  Use this product as needed if not relieved by Colace only.  ° °MiraLax (polyethylene glycol) - Pick up over-the-counter to have on hand.  MiraLax is a solution that will increase the amount of water in your bowels to assist with bowel movements.  Take as directed and can mix with a glass of water, juice, soda, coffee, or tea.  Take if you go more than two days without a movement. °Do not use MiraLax more than once per day. Call your doctor if you are still constipated or irregular after using this medication for 7 days in a row. ° °If you continue to have problems with postoperative constipation, please contact the office for further assistance and recommendations.  If you experience "the worst abdominal pain ever" or develop nausea or vomiting, please contact the office immediatly for further recommendations for treatment. ° °ITCHING ° If you experience itching with your medications, try taking only a single pain pill, or even half a pain pill at a time.  You can also use Benadryl over the counter for itching or also to help with sleep.  ° °TED HOSE STOCKINGS °Wear the elastic stockings on both legs for three weeks following surgery during the day but you may remove then at night for sleeping. ° °MEDICATIONS °See your medication summary on the “After Visit Summary” that the nursing staff will review with you prior to discharge.  You may have some home medications which will be placed on hold until you complete the course of blood thinner medication.  It is important for you to complete the blood thinner medication as prescribed by your surgeon.  Continue your approved medications as instructed at time of discharge. ° °PRECAUTIONS °If  you experience chest pain or shortness of breath - call 911 immediately for transfer to the hospital emergency department.  °If you develop a fever greater that 101 F, purulent drainage from wound, increased redness or drainage from wound, foul odor from the wound/dressing, or calf pain - CONTACT YOUR SURGEON.   °                                                °FOLLOW-UP APPOINTMENTS °Make sure you keep all of your appointments after your operation with your surgeon and caregivers. You should call the office at the above phone number and make an appointment for approximately two weeks after the date of your surgery or on the date instructed by your surgeon outlined in the "After Visit Summary". ° ° °RANGE OF MOTION AND STRENGTHENING EXERCISES  °Rehabilitation of the knee is important following a knee injury or   an operation. After just a few days of immobilization, the muscles of the thigh which control the knee become weakened and shrink (atrophy). Knee exercises are designed to build up the tone and strength of the thigh muscles and to improve knee motion. Often times heat used for twenty to thirty minutes before working out will loosen up your tissues and help with improving the range of motion but do not use heat for the first two weeks following surgery. These exercises can be done on a training (exercise) mat, on the floor, on a table or on a bed. Use what ever works the best and is most comfortable for you Knee exercises include:  °• Leg Lifts - While your knee is still immobilized in a splint or cast, you can do straight leg raises. Lift the leg to 60 degrees, hold for 3 sec, and slowly lower the leg. Repeat 10-20 times 2-3 times daily. Perform this exercise against resistance later as your knee gets better.  °• Quad and Hamstring Sets - Tighten up the muscle on the front of the thigh (Quad) and hold for 5-10 sec. Repeat this 10-20 times hourly. Hamstring sets are done by pushing the foot backward against an  object and holding for 5-10 sec. Repeat as with quad sets.  °· Leg Slides: Lying on your back, slowly slide your foot toward your buttocks, bending your knee up off the floor (only go as far as is comfortable). Then slowly slide your foot back down until your leg is flat on the floor again. °· Angel Wings: Lying on your back spread your legs to the side as far apart as you can without causing discomfort.  °A rehabilitation program following serious knee injuries can speed recovery and prevent re-injury in the future due to weakened muscles. Contact your doctor or a physical therapist for more information on knee rehabilitation.  ° °IF YOU ARE TRANSFERRED TO A SKILLED REHAB FACILITY °If the patient is transferred to a skilled rehab facility following release from the hospital, a list of the current medications will be sent to the facility for the patient to continue.  When discharged from the skilled rehab facility, please have the facility set up the patient's Home Health Physical Therapy prior to being released. Also, the skilled facility will be responsible for providing the patient with their medications at time of release from the facility to include their pain medication, the muscle relaxants, and their blood thinner medication. If the patient is still at the rehab facility at time of the two week follow up appointment, the skilled rehab facility will also need to assist the patient in arranging follow up appointment in our office and any transportation needs. ° °MAKE SURE YOU:  °• Understand these instructions.  °• Get help right away if you are not doing well or get worse.  ° ° °Pick up stool softner and laxative for home use following surgery while on pain medications. °Do not submerge incision under water. °Please use good hand washing techniques while changing dressing each day. °May shower starting three days after surgery. °Please use a clean towel to pat the incision dry following showers. °Continue to  use ice for pain and swelling after surgery. °Do not use any lotions or creams on the incision until instructed by your surgeon. ° °

## 2018-02-23 DIAGNOSIS — M1712 Unilateral primary osteoarthritis, left knee: Secondary | ICD-10-CM | POA: Diagnosis not present

## 2018-02-23 LAB — CBC
HCT: 43.7 % (ref 36.0–46.0)
Hemoglobin: 14 g/dL (ref 12.0–15.0)
MCH: 30.4 pg (ref 26.0–34.0)
MCHC: 32 g/dL (ref 30.0–36.0)
MCV: 95 fL (ref 80.0–100.0)
Platelets: 328 10*3/uL (ref 150–400)
RBC: 4.6 MIL/uL (ref 3.87–5.11)
RDW: 13.1 % (ref 11.5–15.5)
WBC: 14.1 10*3/uL — ABNORMAL HIGH (ref 4.0–10.5)
nRBC: 0 % (ref 0.0–0.2)

## 2018-02-23 LAB — BASIC METABOLIC PANEL
Anion gap: 10 (ref 5–15)
BUN: 8 mg/dL (ref 8–23)
CO2: 20 mmol/L — ABNORMAL LOW (ref 22–32)
Calcium: 9.4 mg/dL (ref 8.9–10.3)
Chloride: 108 mmol/L (ref 98–111)
Creatinine, Ser: 0.78 mg/dL (ref 0.44–1.00)
GFR calc Af Amer: >60 mL/min (ref >60)
GFR calc non Af Amer: >60 mL/min (ref >60)
Glucose, Bld: 164 mg/dL — ABNORMAL HIGH (ref 70–99)
Potassium: 3.7 mmol/L (ref 3.5–5.1)
Sodium: 138 mmol/L (ref 135–145)

## 2018-02-23 MED ORDER — METHOCARBAMOL 500 MG PO TABS: 500.0000 mg | ORAL_TABLET | Freq: Four times a day (QID) | ORAL | 0 refills | Status: DC | PRN

## 2018-02-23 MED ORDER — SODIUM CHLORIDE 0.9 % IV BOLUS
250.0000 mL | Freq: Once | INTRAVENOUS | Status: AC
  Administered 2018-02-23: 250 mL via INTRAVENOUS

## 2018-02-23 MED ORDER — GABAPENTIN 300 MG PO CAPS: 300.0000 mg | ORAL_CAPSULE | Freq: Three times a day (TID) | ORAL | 0 refills | Status: DC

## 2018-02-23 MED ORDER — HYDROMORPHONE HCL 2 MG PO TABS: 2.0000 mg | ORAL_TABLET | Freq: Four times a day (QID) | ORAL | 0 refills | Status: DC | PRN

## 2018-02-23 MED ORDER — ASPIRIN 325 MG PO TBEC: 325.0000 mg | DELAYED_RELEASE_TABLET | Freq: Two times a day (BID) | ORAL | 0 refills | Status: AC

## 2018-02-23 NOTE — Addendum Note (Signed)
Addendum  created 02/23/18 1847 by [name], MD   Clinical Note Signed, Intraprocedure Blocks edited

## 2018-02-23 NOTE — Care Management Note (Signed)
Case Management Note  Patient Details  Name: Renie OraCamile Herring Martel MRN: [id number] Date of Birth: [birth year]  Subjective/Objective:    Spoke with patient at bedside. Confirmed plan for OP PT, already arranged. Has RW but needs a 3n1, contacted [facility] to deliver to the room. [phone]                Action/Plan:   Expected Discharge Date:  02/23/18               Expected Discharge Plan:  OP Rehab  In-House Referral:  NA  Discharge planning Services  CM Consult  Post Acute Care Choice:  NA Choice offered to:  NA  DME Arranged:  3-N-1 DME Agency:  [facility]  HH Arranged:  NA HH Agency:  NA  Status of Service:  Completed, signed off  If discussed at Long Length of Stay Meetings, dates discussed:    Additional Comments:  Alexis Goodelleele, Falynn Ailey K, RN 02/23/2018, 2:33 PM

## 2018-02-23 NOTE — Progress Notes (Signed)
Subjective: 1 Day Post-Op Procedure(s) (LRB): TOTAL KNEE ARTHROPLASTY (Left) Patient reports pain as mild.   Patient seen in rounds by Dr. Lequita HaltAluisio. Patient is well, and has had no acute complaints or problems other than pain in the left knee. No issues overnight. Denies chest pain, SOB, or calf pain. Foley catheter removed this AM. We will continue therapy today.   Objective: Vital signs in last 24 hours: Temp:  [97.5 F (36.4 C)-98.7 F (37.1 C)] 97.6 F (36.4 C) (01/21 0534) Pulse Rate:  [66-115] 88 (01/21 0534) Resp:  [5-21] 16 (01/21 0534) BP: (78-144)/(58-87) 101/65 (01/21 0534) SpO2:  [91 %-100 %] 98 % (01/21 0534) Weight:  [76.2 kg] 76.2 kg (01/20 1703)  Intake/Output from previous day:  Intake/Output Summary (Last 24 hours) at 02/23/2018 0742 Last data filed at 02/23/2018 0534 Gross per 24 hour  Intake 4955.85 ml  Output 3925 ml  Net 1030.85 ml    Labs: Recent Labs    02/23/18 0411  HGB 14.0   Recent Labs    02/23/18 0411  WBC 14.1*  RBC 4.60  HCT 43.7  PLT 328   Recent Labs    02/23/18 0411  NA 138  K 3.7  CL 108  CO2 20*  BUN 8  CREATININE 0.78  GLUCOSE 164*  CALCIUM 9.4   Exam: General - Patient is Alert and Oriented Extremity - Neurologically intact Neurovascular intact Sensation intact distally Dorsiflexion/Plantar flexion intact Dressing - dressing C/D/I Motor Function - intact, moving foot and toes well on exam.   Past Medical History:  Diagnosis Date  . Anxiety   . Chronic kidney disease    kidney stones, history of  . Depression   . Essential tremor 02/17/2017  . Family history of breast cancer in first degree relative 02/17/2017   Mother, maternal aunt and grandmother  . Fibromyalgia   . Fibromyalgia, primary 12/24/2016   Dxd in 1992; with chronic fatigue.   Marland Kitchen. GAD (generalized anxiety disorder) 12/24/2016   Treated wit Paxil  . Gout    reports never had gout in her life   . Headache(784.0)   . History of herniated  intervertebral disc 12/24/2016  . Hyperlipidemia   . Osteoarthritis of left knee   . PONV (postoperative nausea and vomiting)    severe   . Reactive airway disease    has flare ups with chemicals and cigarette smoke   . 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 Thyroid disease    hypothyroid  . Tinnitus    left   . Vertigo 12/24/2016   H/o recalcitrant BPPV; now recurs with sinus infections. Uses zofran and meclizine as needed    Assessment/Plan: 1 Day Post-Op Procedure(s) (LRB): TOTAL KNEE ARTHROPLASTY (Left) Principal Problem:   OA (osteoarthritis) of knee  Estimated body mass index is 31.74 kg/m as calculated from the following:   Height as of this encounter: 5\' 1"  (1.549 m).   Weight as of this encounter: 76.2 kg. Advance diet Up with therapy D/C IV fluids  Anticipated LOS equal to or greater than 2 midnights due to - Age 62 and older with one or more of the following:  - Obesity  - Expected need for hospital services (PT, OT, Nursing) required for safe  discharge  - Anticipated need for postoperative skilled nursing care or inpatient rehab  - Active co-morbidities: None OR   - Unanticipated findings during/Post Surgery: None  - Patient is a high risk of re-admission due to: None    DVT  Prophylaxis - Aspirin Weight bearing as tolerated. D/C O2 and pulse ox and try on room air. Hemovac pulled without difficulty, will continue therapy today.  BP soft overnight, 250 mL bolus ordered. Plan is to go Home after hospital stay. Possible discharge this afternoon if she progresses with therapy and is meeting her goals, otherwise will stay until tomorrow. Scheduled for outpatient physical therapy at [facility]. Follow-up in the office in 2 weeks.   Arther Abbott, PA-C Orthopedic Surgery 02/23/2018, 7:42 AM

## 2018-02-23 NOTE — Addendum Note (Signed)
Addendum  created 02/23/18 4825 by Elyn Peers, CRNA   Charge Capture section accepted, Visit diagnoses modified

## 2018-02-23 NOTE — Plan of Care (Signed)
  Problem: Education: Goal: Knowledge of General Education information will improve Description: Including pain rating scale, medication(s)/side effects and non-pharmacologic comfort measures Outcome: Progressing   Problem: Health Behavior/Discharge Planning: Goal: Ability to manage health-related needs will improve Outcome: Progressing   Problem: Clinical Measurements: Goal: Will remain free from infection Outcome: Progressing Goal: Diagnostic test results will improve Outcome: Progressing Goal: Respiratory complications will improve Outcome: Progressing Goal: Cardiovascular complication will be avoided Outcome: Progressing   Problem: Activity: Goal: Risk for activity intolerance will decrease Outcome: Progressing   

## 2018-02-23 NOTE — Progress Notes (Signed)
Physical Therapy Treatment Patient Details Name: Claudia Luna MRN: [id number] DOB: 17-Mar-1955 Today's Date: 02/23/2018    History of Present Illness 63 yo female s/p L TKR on 02/22/18. PMH includes OA, chronic pain and fibromyalgia, chronic narcotic dependence, essential tremor, vertigo, anxiety, depression.     PT Comments    PT session this afternoon focusing largely on progressing gait training as well as stair navigation. Patient and husband feeling most comfortable with navigating stairs with RW fwd/bwds with handout provided for full carryover. Patient continues to be limited by pain. Will continue to follow.     Follow Up Recommendations  Follow surgeon's recommendation for DC plan and follow-up therapies;Supervision for mobility/OOB     Equipment Recommendations  3in1 (PT)    Recommendations for Other Services       Precautions / Restrictions Precautions Precautions: Fall Required Braces or Orthoses: Knee Immobilizer - Left Knee Immobilizer - Left: On when out of bed or walking;Discontinue once straight leg raise with < 10 degree lag Restrictions Weight Bearing Restrictions: No Other Position/Activity Restrictions: WBAT     Mobility  Bed Mobility Overal bed mobility: Needs Assistance Bed Mobility: Supine to Sit     Supine to sit: Min assist     General bed mobility comments: Min A for LE management; patient with good effort - needs light assist to lower LE to floor  Transfers Overall transfer level: Needs assistance Equipment used: Rolling walker (2 wheeled) Transfers: Sit to/from [employer] Sit to Stand: Min guard Stand pivot transfers: Min guard       General transfer comment: min guard with cueing for hand placement and sequencing  Ambulation/Gait Ambulation/Gait assistance: Min guard Gait Distance (Feet): 150 Feet Assistive device: Rolling walker (2 wheeled) Gait Pattern/deviations: Step-to pattern;Decreased stance time -  left;Decreased weight shift to left;Antalgic;Step-through pattern Gait velocity: decreased   General Gait Details: min guard for safety - good progression; able to start to progress to step through pattern   Stairs Stairs: Yes Stairs assistance: Min assist;Min guard Stair Management: Step to pattern Number of Stairs: 2(5 sets - 8") General stair comments: education/practice in various manners; B hand rails, single handrail and 1 HHA; 1 HHA only; with walker backwards; educational handout provided with patient feeling most comfortable to backwards with walker   Wheelchair Mobility    Modified Rankin (Stroke Patients Only)       Balance Overall balance assessment: Mild deficits observed, not formally tested                                          Cognition Arousal/Alertness: Awake/alert Behavior During Therapy: WFL for tasks assessed/performed Overall Cognitive Status: Within Functional Limits for tasks assessed                                        Exercises      General Comments        Pertinent Vitals/Pain Pain Assessment: 0-10 Pain Score: 5  Pain Location: L knee with extension and mobility Pain Descriptors / Indicators: Aching;Discomfort;Grimacing;Guarding Pain Intervention(s): Limited activity within patient's tolerance;Monitored during session;Premedicated before session;Repositioned    Home Living                      Prior Function  PT Goals (current goals can now be found in the care plan section) Acute Rehab PT Goals Patient Stated Goal: none stated  PT Goal Formulation: With patient Time For Goal Achievement: 03/01/18 Potential to Achieve Goals: Good Progress towards PT goals: Progressing toward goals    Frequency    7X/week      PT Plan Current plan remains appropriate    Co-evaluation              AM-PAC PT "6 Clicks" Mobility   Outcome Measure  Help needed turning from  your back to your side while in a flat bed without using bedrails?: A Little Help needed moving from lying on your back to sitting on the side of a flat bed without using bedrails?: A Little Help needed moving to and from a bed to a chair (including a wheelchair)?: A Little Help needed standing up from a chair using your arms (e.g., wheelchair or bedside chair)?: A Little Help needed to walk in hospital room?: A Little Help needed climbing 3-5 steps with a railing? : A Little 6 Click Score: 18    End of Session Equipment Utilized During Treatment: Gait belt;Left knee immobilizer Activity Tolerance: Patient tolerated treatment well;Patient limited by pain Patient left: in bed;with call bell/phone within reach;with family/visitor present Nurse Communication: Mobility status PT Visit Diagnosis: Other abnormalities of gait and mobility (R26.89);Difficulty in walking, not elsewhere classified (R26.2)     Time: 9562-1308 PT Time Calculation (min) (ACUTE ONLY): 28 min  Charges:  $Gait Training: 23-37 mins                     Kipp Laurence, PT, DPT Supplemental Physical Therapist 02/23/18 3:30 PM Pager: [phone] Office: [phone]

## 2018-02-23 NOTE — Progress Notes (Signed)
Physical Therapy Treatment Patient Details Name: Claudia Luna MRN: [id number] DOB: 08/23/1955 Today's Date: 02/23/2018    History of Present Illness 63 yo female s/p L TKR on 02/22/18. PMH includes OA, chronic pain and fibromyalgia, chronic narcotic dependence, essential tremor, vertigo, anxiety, depression.     PT Comments    Patient seen for mobility progression and exercise instruction. Patient primarily limited due to L knee pain - nursing aware. Patient with some assist needed for heel slides and SLR, otherwise tolerable to all activities. Good progression of gait, however does still tend to offload L LE with mobility. Attempted to sit in recliner following session with L LE propped into extension, however intolerable to this. Will plan to educate on stair navigation at next session.     Follow Up Recommendations  Follow surgeon's recommendation for DC plan and follow-up therapies;Supervision for mobility/OOB     Equipment Recommendations  None recommended by PT    Recommendations for Other Services       Precautions / Restrictions Precautions Precautions: Fall Required Braces or Orthoses: Knee Immobilizer - Left Knee Immobilizer - Left: On when out of bed or walking;Discontinue once straight leg raise with < 10 degree lag Restrictions Weight Bearing Restrictions: No Other Position/Activity Restrictions: WBAT     Mobility  Bed Mobility Overal bed mobility: Needs Assistance Bed Mobility: Supine to Sit     Supine to sit: Min assist     General bed mobility comments: Min A for LE management; education on hooking R LE under L LE for self assist  Transfers Overall transfer level: Needs assistance Equipment used: Rolling walker (2 wheeled) Transfers: Sit to/from [employer];Stand Pivot Transfers Sit to Stand: Min guard Stand pivot transfers: Min guard       General transfer comment: min guard with cueing for hand placement and  sequencing  Ambulation/Gait Ambulation/Gait assistance: Min guard Gait Distance (Feet): 75 Feet Assistive device: Rolling walker (2 wheeled) Gait Pattern/deviations: Step-to pattern;Decreased stance time - left;Decreased weight shift to left;Antalgic Gait velocity: decreased   General Gait Details: min guard for safety and stability; some increased pain; cueing for heel strike   Stairs             Wheelchair Mobility    Modified Rankin (Stroke Patients Only)       Balance Overall balance assessment: Mild deficits observed, not formally tested                                          Cognition Arousal/Alertness: Awake/alert Behavior During Therapy: WFL for tasks assessed/performed Overall Cognitive Status: Within Functional Limits for tasks assessed                                        Exercises Total Joint Exercises Ankle Circles/Pumps: AROM;Both;10 reps Quad Sets: AROM;Left;10 reps Gluteal Sets: AROM;Both;10 reps Heel Slides: AAROM;Left;10 reps;Supine Hip ABduction/ADduction: AROM;Left;10 reps Straight Leg Raises: AAROM;Left;10 reps;Supine    General Comments        Pertinent Vitals/Pain Pain Assessment: 0-10 Pain Score: 7  Pain Location: L knee with extension and mobility Pain Descriptors / Indicators: Aching;Discomfort;Grimacing;Guarding Pain Intervention(s): Limited activity within patient's tolerance;Monitored during session;Repositioned;Premedicated before session    Home Living  Prior Function            PT Goals (current goals can now be found in the care plan section) Acute Rehab PT Goals Patient Stated Goal: none stated  PT Goal Formulation: With patient Time For Goal Achievement: 03/01/18 Potential to Achieve Goals: Good Progress towards PT goals: Progressing toward goals    Frequency    7X/week      PT Plan Current plan remains appropriate    Co-evaluation               AM-PAC PT "6 Clicks" Mobility   Outcome Measure  Help needed turning from your back to your side while in a flat bed without using bedrails?: A Little Help needed moving from lying on your back to sitting on the side of a flat bed without using bedrails?: A Little Help needed moving to and from a bed to a chair (including a wheelchair)?: A Little Help needed standing up from a chair using your arms (e.g., wheelchair or bedside chair)?: A Little Help needed to walk in hospital room?: A Little Help needed climbing 3-5 steps with a railing? : A Little 6 Click Score: 18    End of Session Equipment Utilized During Treatment: Gait belt;Left knee immobilizer Activity Tolerance: Patient tolerated treatment well;Patient limited by pain Patient left: in bed;with call bell/phone within reach;with family/visitor present Nurse Communication: Mobility status PT Visit Diagnosis: Other abnormalities of gait and mobility (R26.89);Difficulty in walking, not elsewhere classified (R26.2)     Time: [id number] PT Time Calculation (min) (ACUTE ONLY): 35 min  Charges:  $Gait Training: 8-22 mins $Therapeutic Exercise: 8-22 mins                      Kipp LaurenceStephanie R Aaron, PT, DPT Supplemental Physical Therapist 02/23/18 11:40 AM Pager: [phone] Office: [phone]

## 2018-02-24 ENCOUNTER — Encounter (HOSPITAL_COMMUNITY): Payer: Self-pay | Admitting: Orthopedic Surgery

## 2018-02-24 DIAGNOSIS — M1712 Unilateral primary osteoarthritis, left knee: Secondary | ICD-10-CM | POA: Diagnosis not present

## 2018-02-24 LAB — BASIC METABOLIC PANEL
Anion gap: 8 (ref 5–15)
BUN: 9 mg/dL (ref 8–23)
CO2: 24 mmol/L (ref 22–32)
Calcium: 9.2 mg/dL (ref 8.9–10.3)
Chloride: 109 mmol/L (ref 98–111)
Creatinine, Ser: 0.65 mg/dL (ref 0.44–1.00)
GFR calc Af Amer: >60 mL/min (ref >60)
GFR calc non Af Amer: >60 mL/min (ref >60)
Glucose, Bld: 138 mg/dL — ABNORMAL HIGH (ref 70–99)
Potassium: 3.6 mmol/L (ref 3.5–5.1)
Sodium: 141 mmol/L (ref 135–145)

## 2018-02-24 LAB — CBC
HCT: 38.4 % (ref 36.0–46.0)
Hemoglobin: 12.3 g/dL (ref 12.0–15.0)
MCH: 30.6 pg (ref 26.0–34.0)
MCHC: 32 g/dL (ref 30.0–36.0)
MCV: 95.5 fL (ref 80.0–100.0)
Platelets: 315 10*3/uL (ref 150–400)
RBC: 4.02 MIL/uL (ref 3.87–5.11)
RDW: 13.2 % (ref 11.5–15.5)
WBC: 13.9 10*3/uL — ABNORMAL HIGH (ref 4.0–10.5)
nRBC: 0 % (ref 0.0–0.2)

## 2018-02-24 NOTE — Progress Notes (Signed)
Physical Therapy Treatment Patient Details Name: Claudia Luna MRN: [id number] DOB: 1955-05-21 Today's Date: 02/24/2018    History of Present Illness 63 yo female s/p L TKR on 02/22/18. PMH includes OA, chronic pain and fibromyalgia, chronic narcotic dependence, essential tremor, vertigo, anxiety, depression.     PT Comments    POD # 2 second session Spouse present during session for "hands on" instruction on all mobility.  Assisted OOB to amb a greater distance in hallway, practice stairts then performed all supine TKR TE's following HEP handout.  Instructed on proper tech, freq as well as use of ICE.  Addressed all mobility questions, discussed appropriate activity, educated on use of ICE.  Pt ready for D/C to home.   Follow Up Recommendations  Follow surgeon's recommendation for DC plan and follow-up therapies;Supervision for mobility/OOB     Equipment Recommendations       Recommendations for Other Services       Precautions / Restrictions Precautions Precautions: Fall Precaution Comments: instructed on KI use for amb and stairs Required Braces or Orthoses: Knee Immobilizer - Left Knee Immobilizer - Left: On when out of bed or walking;Discontinue once straight leg raise with < 10 degree lag Restrictions Weight Bearing Restrictions: No Other Position/Activity Restrictions: WBAT     Mobility  Bed Mobility Overal bed mobility: Needs Assistance Bed Mobility: Supine to Sit     Supine to sit: Min assist     General bed mobility comments: Min A for LE management  Transfers Overall transfer level: Needs assistance Equipment used: Rolling walker (2 wheeled) Transfers: Sit to/from [employer] Sit to Stand: Supervision;Min guard Stand pivot transfers: Supervision;Min guard       General transfer comment: min guard with cueing for hand placement and sequencing  Ambulation/Gait Ambulation/Gait assistance: Min guard Gait Distance (Feet): 55  Feet Assistive device: Rolling walker (2 wheeled) Gait Pattern/deviations: Step-to pattern;Decreased stance time - left;Decreased weight shift to left;Antalgic;Step-through pattern Gait velocity: decreased   General Gait Details: tolerated an increased distance   Stairs Stairs: Yes   Stair Management: Step to pattern Number of Stairs: 3 General stair comments: with spouse "hands on" assisted and 50% VC's on proper tech   Wheelchair Mobility    Modified Rankin (Stroke Patients Only)       Balance                                            Cognition Arousal/Alertness: Awake/alert Behavior During Therapy: WFL for tasks assessed/performed Overall Cognitive Status: Within Functional Limits for tasks assessed                                        Exercises   Total Knee Replacement TE's 10 reps B LE ankle pumps 10 reps towel squeezes 10 reps knee presses 10 reps heel slides  10 reps SAQ's 10 reps SLR's 10 reps ABD Followed by ICE    General Comments        Pertinent Vitals/Pain Pain Assessment: No/denies pain Pain Score: 6  Pain Location: L knee  Pain Descriptors / Indicators: Aching;Discomfort;Grimacing;Guarding Pain Intervention(s): Monitored during session;Repositioned;Premedicated before session;Ice applied    Home Living  Prior Function            PT Goals (current goals can now be found in the care plan section) Progress towards PT goals: Progressing toward goals    Frequency    7X/week      PT Plan Current plan remains appropriate    Co-evaluation              AM-PAC PT "6 Clicks" Mobility   Outcome Measure  Help needed turning from your back to your side while in a flat bed without using bedrails?: A Little Help needed moving from lying on your back to sitting on the side of a flat bed without using bedrails?: A Little Help needed moving to and from a bed to a  chair (including a wheelchair)?: A Little Help needed standing up from a chair using your arms (e.g., wheelchair or bedside chair)?: A Little Help needed to walk in hospital room?: A Little Help needed climbing 3-5 steps with a railing? : A Little 6 Click Score: 18    End of Session Equipment Utilized During Treatment: Gait belt Activity Tolerance: Patient tolerated treatment well;Patient limited by pain Patient left: in bed;with call bell/phone within reach;with family/visitor present Nurse Communication: (pt ready for D/C) PT Visit Diagnosis: Other abnormalities of gait and mobility (R26.89);Difficulty in walking, not elsewhere classified (R26.2)     Time: 1125-1220 PT Time Calculation (min) (ACUTE ONLY): 55 min  Charges:  $Gait Training: 23-37 mins $Therapeutic Exercise: 8-22 mins $Self Care/Home Management: 8-22                     Felecia Shelling  PTA Acute  Rehabilitation Services Pager      [phone] Office      [phone]

## 2018-02-24 NOTE — Progress Notes (Signed)
Physical Therapy Treatment Patient Details Name: Claudia Luna MRN: [id number] DOB: 1955/09/24 Today's Date: 02/24/2018    History of Present Illness 63 yo female s/p L TKR on 02/22/18. PMH includes OA, chronic pain and fibromyalgia, chronic narcotic dependence, essential tremor, vertigo, anxiety, depression.     PT Comments    POD # 2 am session Assisted OOB and only amb 7 feet due to increased pain and emotional state.  Returned to bed, requested pain meds and applied ICE.  Will return a bit later.   Follow Up Recommendations  Follow surgeon's recommendation for DC plan and follow-up therapies;Supervision for mobility/OOB     Equipment Recommendations       Recommendations for Other Services       Precautions / Restrictions Precautions Precautions: Fall Precaution Comments: instructed on KI use for amb and stairs Required Braces or Orthoses: Knee Immobilizer - Left Knee Immobilizer - Left: On when out of bed or walking;Discontinue once straight leg raise with < 10 degree lag Restrictions Weight Bearing Restrictions: No Other Position/Activity Restrictions: WBAT     Mobility  Bed Mobility Overal bed mobility: Needs Assistance Bed Mobility: Supine to Sit     Supine to sit: Min assist     General bed mobility comments: Min A for LE management  Transfers Overall transfer level: Needs assistance Equipment used: Rolling walker (2 wheeled) Transfers: Sit to/from [employer] Sit to Stand: Supervision;Min guard Stand pivot transfers: Supervision;Min guard       General transfer comment: min guard with cueing for hand placement and sequencing  Ambulation/Gait Ambulation/Gait assistance: Min guard Gait Distance (Feet): 7 Feet Assistive device: Rolling walker (2 wheeled) Gait Pattern/deviations: Step-to pattern;Decreased stance time - left;Decreased weight shift to left;Antalgic;Step-through pattern Gait velocity: decreased   General Gait  Details: distance limited by pain and emotional state.  Returned to bed and requested pain meds   Stairs             Wheelchair Mobility    Modified Rankin (Stroke Patients Only)       Balance                                            Cognition Arousal/Alertness: Awake/alert Behavior During Therapy: WFL for tasks assessed/performed Overall Cognitive Status: Within Functional Limits for tasks assessed                                        Exercises      General Comments        Pertinent Vitals/Pain Pain Assessment: No/denies pain Pain Score: 6  Pain Location: L knee  Pain Descriptors / Indicators: Aching;Discomfort;Grimacing;Guarding Pain Intervention(s): Monitored during session;Repositioned;Premedicated before session;Ice applied    Home Living                      Prior Function            PT Goals (current goals can now be found in the care plan section) Progress towards PT goals: Progressing toward goals    Frequency    7X/week      PT Plan Current plan remains appropriate    Co-evaluation              AM-PAC PT "6 Clicks" Mobility  Outcome Measure  Help needed turning from your back to your side while in a flat bed without using bedrails?: A Little Help needed moving from lying on your back to sitting on the side of a flat bed without using bedrails?: A Little Help needed moving to and from a bed to a chair (including a wheelchair)?: A Little Help needed standing up from a chair using your arms (e.g., wheelchair or bedside chair)?: A Little Help needed to walk in hospital room?: A Little Help needed climbing 3-5 steps with a railing? : A Little 6 Click Score: 18    End of Session Equipment Utilized During Treatment: Gait belt Activity Tolerance: Patient tolerated treatment well;Patient limited by pain Patient left: in bed;with call bell/phone within reach;with family/visitor  present Nurse Communication: Patient requests pain meds PT Visit Diagnosis: Other abnormalities of gait and mobility (R26.89);Difficulty in walking, not elsewhere classified (R26.2)     Time: 1015-1030 PT Time Calculation (min) (ACUTE ONLY): 15 min  Charges:  $Gait Training: 8-22 mins                     Felecia Shelling  PTA Acute  Rehabilitation Services Pager      [phone] Office      [phone]

## 2018-02-24 NOTE — Progress Notes (Signed)
   Subjective: 2 Days Post-Op Procedure(s) (LRB): TOTAL KNEE ARTHROPLASTY (Left) Patient reports pain as mild.   Patient seen in rounds with Dr. Lequita HaltAluisio. Patient is well, and has had no acute complaints or problems. States she is ready to go home. No issues overnight. Voiding without difficulty and positive flatus. Denies chest pain or SOB.  Plan is to go Home after hospital stay.  Objective: Vital signs in last 24 hours: Temp:  [97.7 F (36.5 C)-98.6 F (37 C)] 98.4 F (36.9 C) (01/21 2128) Pulse Rate:  [77-89] 77 (01/21 2128) Resp:  [14-20] 20 (01/21 2128) BP: (109-134)/(63-73) 134/73 (01/21 2128) SpO2:  [93 %-98 %] 98 % (01/21 2128)  Intake/Output from previous day:  Intake/Output Summary (Last 24 hours) at 02/24/2018 0739 Last data filed at 02/24/2018 0600 Gross per 24 hour  Intake 2011.25 ml  Output 2100 ml  Net -88.75 ml    Labs: Recent Labs    02/23/18 0411 02/24/18 0336  HGB 14.0 12.3   Recent Labs    02/23/18 0411 02/24/18 0336  WBC 14.1* 13.9*  RBC 4.60 4.02  HCT 43.7 38.4  PLT 328 315   Recent Labs    02/23/18 0411 02/24/18 0336  NA 138 141  K 3.7 3.6  CL 108 109  CO2 20* 24  BUN 8 9  CREATININE 0.78 0.65  GLUCOSE 164* 138*  CALCIUM 9.4 9.2   Exam: General - Patient is Alert and Oriented Extremity - Neurologically intact Neurovascular intact Sensation intact distally Dorsiflexion/Plantar flexion intact Dressing/Incision - clean, dry, no drainage Motor Function - intact, moving foot and toes well on exam.   Past Medical History:  Diagnosis Date  . Anxiety   . Chronic kidney disease    kidney stones, history of  . Depression   . Essential tremor 02/17/2017  . Family history of breast cancer in first degree relative 02/17/2017   Mother, maternal aunt and grandmother  . Fibromyalgia   . Fibromyalgia, primary 12/24/2016   Dxd in 1992; with chronic fatigue.   Marland Kitchen. GAD (generalized anxiety disorder) 12/24/2016   Treated wit Paxil  . Gout      reports never had gout in her life   . Headache(784.0)   . History of herniated intervertebral disc 12/24/2016  . Hyperlipidemia   . Osteoarthritis of left knee   . PONV (postoperative nausea and vomiting)    severe   . Reactive airway disease    has flare ups with chemicals and cigarette smoke   . 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 Thyroid disease    hypothyroid  . Tinnitus    left   . Vertigo 12/24/2016   H/o recalcitrant BPPV; now recurs with sinus infections. Uses zofran and meclizine as needed    Assessment/Plan: 2 Days Post-Op Procedure(s) (LRB): TOTAL KNEE ARTHROPLASTY (Left) Principal Problem:   OA (osteoarthritis) of knee  Estimated body mass index is 31.74 kg/m as calculated from the following:   Height as of this encounter: 5\' 1"  (1.549 m).   Weight as of this encounter: 76.2 kg. Up with therapy D/C IV fluids  DVT Prophylaxis - Aspirin Weight-bearing as tolerated  Plan for discharge this AM after one session of PT. [facility] Follow-up in the office in 2 weeks.   Arther AbbottKristie Edmisten, PA-C Orthopedic Surgery 02/24/2018, 7:39 AM

## 2018-02-24 NOTE — Discharge Summary (Signed)
Physician Discharge Summary   Patient ID: Claudia Luna MRN: [id number] DOB/AGE: 1955-03-30 63 y.o.  Admit date: 02/22/2018 Discharge date: 02/24/2018  Primary Diagnosis: Osteoarthritis, left knee   Admission Diagnoses:  Past Medical History:  Diagnosis Date  . Anxiety   . Chronic kidney disease    kidney stones, history of  . Depression   . Essential tremor 02/17/2017  . Family history of breast cancer in first degree relative 02/17/2017   Mother, maternal aunt and grandmother  . Fibromyalgia   . Fibromyalgia, primary 12/24/2016   Dxd in 1992; with chronic fatigue.   Marland Kitchen GAD (generalized anxiety disorder) 12/24/2016   Treated wit Paxil  . Gout    reports never had gout in her life   . Headache(784.0)   . History of herniated intervertebral disc 12/24/2016  . Hyperlipidemia   . Osteoarthritis of left knee   . PONV (postoperative nausea and vomiting)    severe   . Reactive airway disease    has flare ups with chemicals and cigarette smoke   . 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 Thyroid disease    hypothyroid  . Tinnitus    left   . Vertigo 12/24/2016   H/o recalcitrant BPPV; now recurs with sinus infections. Uses zofran and meclizine as needed   Discharge Diagnoses:   Principal Problem:   OA (osteoarthritis) of knee  Estimated body mass index is 31.74 kg/m as calculated from the following:   Height as of this encounter: 5\' 1"  (1.549 m).   Weight as of this encounter: 76.2 kg.  Procedure:  Procedure(s) (LRB): TOTAL KNEE ARTHROPLASTY (Left)   Consults: None  HPI: Claudia Luna is a 63 y.o. year old female with end stage OA of her left knee with progressively worsening pain and dysfunction. She has constant pain, with activity and at rest and significant functional deficits with difficulties even with ADLs. She has had extensive non-op management including analgesics, injections of cortisone and viscosupplements, and home exercise program, but remains in  significant pain with significant dysfunction. Radiographs show bone on bone arthritis medial and patellofemoral. She presents now for left Total Knee Arthroplasty.    Laboratory Data: Admission on 02/22/2018, Discharged on 02/24/2018  Component Date Value Ref Range Status  . ABO/RH(D) 02/22/2018 O POS   Final  . Antibody Screen 02/22/2018 NEG   Final  . Sample Expiration 02/22/2018    Final                   Value:02/25/2018 Performed at [facility], [address] [place], [place] [address]   . ABO/RH(D) 02/22/2018    Final                   Value:O POS Performed at [facility], [address] [place], [place] [address]   . WBC 02/23/2018 14.1* 4.0 - 10.5 K/uL Final  . RBC 02/23/2018 4.60  3.87 - 5.11 MIL/uL Final  . Hemoglobin 02/23/2018 14.0  12.0 - 15.0 g/dL Final  . HCT 82/95/6213 43.7  36.0 - 46.0 % Final  . MCV 02/23/2018 95.0  80.0 - 100.0 fL Final  . MCH 02/23/2018 30.4  26.0 - 34.0 pg Final  . MCHC 02/23/2018 32.0  30.0 - 36.0 g/dL Final  . RDW 08/65/7846 13.1  11.5 - 15.5 % Final  . Platelets 02/23/2018 328  150 - 400 K/uL Final  . nRBC 02/23/2018 0.0  0.0 - 0.2 % Final  Performed at [facility], [address] [place], [place] [address]  . Sodium 02/23/2018 138  135 - 145 mmol/L Final  . Potassium 02/23/2018 3.7  3.5 - 5.1 mmol/L Final  . Chloride 02/23/2018 108  98 - 111 mmol/L Final  . CO2 02/23/2018 20* 22 - 32 mmol/L Final  . Glucose, Bld 02/23/2018 164* 70 - 99 mg/dL Final  . BUN 60/45/409801/21/2020 8  8 - 23 mg/dL Final  . Creatinine, Ser 02/23/2018 0.78  0.44 - 1.00 mg/dL Final  . Calcium 11/91/478201/21/2020 9.4  8.9 - 10.3 mg/dL Final  . GFR calc non Af Amer 02/23/2018 >60  >60 mL/min Final  . GFR calc Af Amer 02/23/2018 >60  >60 mL/min Final  . Anion gap 02/23/2018 10  5 - 15 Final   Performed at [facility], [address] [place], [place] [address]  . WBC 02/24/2018 13.9* 4.0 - 10.5 K/uL  Final  . RBC 02/24/2018 4.02  3.87 - 5.11 MIL/uL Final  . Hemoglobin 02/24/2018 12.3  12.0 - 15.0 g/dL Final  . HCT 30/86/578401/22/2020 38.4  36.0 - 46.0 % Final  . MCV 02/24/2018 95.5  80.0 - 100.0 fL Final  . MCH 02/24/2018 30.6  26.0 - 34.0 pg Final  . MCHC 02/24/2018 32.0  30.0 - 36.0 g/dL Final  . RDW 69/62/952801/22/2020 13.2  11.5 - 15.5 % Final  . Platelets 02/24/2018 315  150 - 400 K/uL Final  . nRBC 02/24/2018 0.0  0.0 - 0.2 % Final   Performed at [facility], [address] [place], [place] [address]  . Sodium 02/24/2018 141  135 - 145 mmol/L Final  . Potassium 02/24/2018 3.6  3.5 - 5.1 mmol/L Final  . Chloride 02/24/2018 109  98 - 111 mmol/L Final  . CO2 02/24/2018 24  22 - 32 mmol/L Final  . Glucose, Bld 02/24/2018 138* 70 - 99 mg/dL Final  . BUN 40/10/272501/22/2020 9  8 - 23 mg/dL Final  . Creatinine, Ser 02/24/2018 0.65  0.44 - 1.00 mg/dL Final  . Calcium 36/64/403401/22/2020 9.2  8.9 - 10.3 mg/dL Final  . GFR calc non Af Amer 02/24/2018 >60  >60 mL/min Final  . GFR calc Af Amer 02/24/2018 >60  >60 mL/min Final  . Anion gap 02/24/2018 8  5 - 15 Final   Performed at [facility], [address] [place], [place] [address]  Hospital Outpatient Visit on 02/10/2018  Component Date Value Ref Range Status  . aPTT 02/10/2018 28  24 - 36 seconds Final   Performed at [facility], [address] [place], [place] [address]  . WBC 02/10/2018 6.4  4.0 - 10.5 K/uL Final  . RBC 02/10/2018 5.11  3.87 - 5.11 MIL/uL Final  . Hemoglobin 02/10/2018 15.4* 12.0 - 15.0 g/dL Final  . HCT 56/43/329501/09/2018 47.7* 36.0 - 46.0 % Final  . MCV 02/10/2018 93.3  80.0 - 100.0 fL Final  . MCH 02/10/2018 30.1  26.0 - 34.0 pg Final  . MCHC 02/10/2018 32.3  30.0 - 36.0 g/dL Final  . RDW 18/84/166001/09/2018 12.9  11.5 - 15.5 % Final  . Platelets 02/10/2018 264  150 - 400 K/uL Final  . nRBC 02/10/2018 0.3* 0.0 - 0.2 % Final   Performed at [facility], [address]  [place], [place] [address]  . Sodium 02/10/2018 139  135 - 145 mmol/L Final  . Potassium 02/10/2018 4.2  3.5 - 5.1 mmol/L  Final  . Chloride 02/10/2018 107  98 - 111 mmol/L Final  . CO2 02/10/2018 23  22 - 32 mmol/L Final  . Glucose, Bld 02/10/2018 103* 70 - 99 mg/dL Final  . BUN 91/47/8295 12  8 - 23 mg/dL Final  . Creatinine, Ser 02/10/2018 0.84  0.44 - 1.00 mg/dL Final  . Calcium 62/13/0865 9.2  8.9 - 10.3 mg/dL Final  . Total Protein 02/10/2018 7.5  6.5 - 8.1 g/dL Final  . Albumin 78/46/9629 4.2  3.5 - 5.0 g/dL Final  . AST 52/84/1324 21  15 - 41 U/L Final  . ALT 02/10/2018 19  0 - 44 U/L Final  . Alkaline Phosphatase 02/10/2018 52  38 - 126 U/L Final  . Total Bilirubin 02/10/2018 0.6  0.3 - 1.2 mg/dL Final  . GFR calc non Af Amer 02/10/2018 >60  >60 mL/min Final  . GFR calc Af Amer 02/10/2018 >60  >60 mL/min Final  . Anion gap 02/10/2018 9  5 - 15 Final   Performed at [facility], [address] [place], [place] [address]  . Prothrombin Time 02/10/2018 12.7  11.4 - 15.2 seconds Final  . INR 02/10/2018 0.97   Final   Performed at [facility], [address], [place], [place] [address]  . MRSA, PCR 02/10/2018 NEGATIVE  NEGATIVE Final  . Staphylococcus aureus 02/10/2018 NEGATIVE  NEGATIVE Final   Comment: (NOTE) The Xpert SA Assay (FDA approved for NASAL specimens in patients 74 years of age and older), is one component of a comprehensive surveillance program. It is not intended to diagnose infection nor to guide or monitor treatment. Performed at [facility], [address], [place], [place] [address]      X-Rays:No results found.  EKG: Orders placed or performed in visit on 01/31/15  . EKG 12-Lead     Hospital Course: Claudia Luna is a 63 y.o. who was admitted to [facility]. They were brought to the operating room on 02/22/2018 and underwent Procedure(s): TOTAL KNEE ARTHROPLASTY.  Patient tolerated  the procedure well and was later transferred to the recovery room and then to the orthopaedic floor for postoperative care. They were given PO and IV analgesics for pain control following their surgery. They were given 24 hours of postoperative antibiotics of  Anti-infectives (From admission, onward)   Start     Dose/Rate Route Frequency Ordered Stop   02/22/18 2000  ceFAZolin (ANCEF) IVPB 2g/100 mL premix     2 g 200 mL/hr over 30 Minutes Intravenous Every 6 hours 02/22/18 1609 02/23/18 0206   02/22/18 1000  ceFAZolin (ANCEF) IVPB 2g/100 mL premix     2 g 200 mL/hr over 30 Minutes Intravenous On call to O.R. 02/22/18 4742 02/22/18 1320     and started on DVT prophylaxis in the form of Aspirin.   PT and OT were ordered for total joint protocol. Discharge planning consulted to help with postop disposition and equipment needs. Patient had a decent night on the evening of surgery. Blood pressure was soft overnight and a 250 mL bolus was ordered. They started to get up OOB with therapy on POD #0. Hemovac drain was pulled without difficulty on day one. Continued to work with therapy into POD #2. Pt was seen during rounds on day two and was ready to go home pending progress with therapy. Dressing was changed and the incision was clean, dry, and intact with no drainage. Pt worked with therapy for  one additional session and was meeting their goals. She was discharged to home later that day in stable condition.  Diet: Regular diet Activity: WBAT Follow-up: in 2 weeks Disposition: Home with outpatient physical therapy Discharged Condition: stable   Discharge Instructions    Call MD / Call 911   Complete by:  As directed    If you experience chest pain or shortness of breath, CALL 911 and be transported to the hospital emergency room.  If you develope a fever above 101 F, pus (white drainage) or increased drainage or redness at the wound, or calf pain, call your surgeon's office.   Change dressing    Complete by:  As directed    Change dressing on Wednesday, then change the dressing daily with sterile 4 x 4 inch gauze dressing and apply TED hose.   Constipation Prevention   Complete by:  As directed    Drink plenty of fluids.  Prune juice may be helpful.  You may use a stool softener, such as Colace (over the counter) 100 mg twice a day.  Use MiraLax (over the counter) for constipation as needed.   Diet - low sodium heart healthy   Complete by:  As directed    Discharge instructions   Complete by:  As directed    Dr. Ollen GrossFrank Aluisio Total Joint Specialist [facility] [address] [place], [place] [address] [phone]  TOTAL KNEE REPLACEMENT POSTOPERATIVE DIRECTIONS  Knee Rehabilitation, Guidelines Following Surgery  Results after knee surgery are often greatly improved when you follow the exercise, range of motion and muscle strengthening exercises prescribed by your doctor. Safety measures are also important to protect the knee from further injury. Any time any of these exercises cause you to have increased pain or swelling in your knee joint, decrease the amount until you are comfortable again and slowly increase them. If you have problems or questions, call your caregiver or physical therapist for advice.   HOME CARE INSTRUCTIONS  Remove items at home which could result in a fall. This includes throw rugs or furniture in walking pathways.  ICE to the affected knee every three hours for 30 minutes at a time and then as needed for pain and swelling.  Continue to use ice on the knee for pain and swelling from surgery. You may notice swelling that will progress down to the foot and ankle.  This is normal after surgery.  Elevate the leg when you are not up walking on it.   Continue to use the breathing machine which will help keep your temperature down.  It is common for your temperature to cycle up and down following surgery, especially at night when you are not up moving  around and exerting yourself.  The breathing machine keeps your lungs expanded and your temperature down. Do not place pillow under knee, focus on keeping the knee straight while resting   DIET You may resume your previous home diet once your are discharged from the hospital.  DRESSING / WOUND CARE / SHOWERING You may shower 3 days after surgery, but keep the wounds dry during showering.  You may use an occlusive plastic wrap (Press'n Seal for example), NO SOAKING/SUBMERGING IN THE BATHTUB.  If the bandage gets wet, change with a clean dry gauze.  If the incision gets wet, pat the wound dry with a clean towel. You may start showering once you are discharged home but do not submerge the incision under water. Just pat the incision dry  and apply a dry gauze dressing on daily. Change the surgical dressing daily and reapply a dry dressing each time.  ACTIVITY Walk with your walker as instructed. Use walker as long as suggested by your caregivers. Avoid periods of inactivity such as sitting longer than an hour when not asleep. This helps prevent blood clots.  You may resume a sexual relationship in one month or when given the OK by your doctor.  You may return to work once you are cleared by your doctor.  Do not drive a car for 6 weeks or until released by you surgeon.  Do not drive while taking narcotics.  WEIGHT BEARING Weight bearing as tolerated with assist device (walker, cane, etc) as directed, use it as long as suggested by your surgeon or therapist, typically at least 4-6 weeks.  POSTOPERATIVE CONSTIPATION PROTOCOL Constipation - defined medically as fewer than three stools per week and severe constipation as less than one stool per week.  One of the most common issues patients have following surgery is constipation.  Even if you have a regular bowel pattern at home, your normal regimen is likely to be disrupted due to multiple reasons following surgery.  Combination of anesthesia,  postoperative narcotics, change in appetite and fluid intake all can affect your bowels.  In order to avoid complications following surgery, here are some recommendations in order to help you during your recovery period.  Colace (docusate) - Pick up an over-the-counter form of Colace or another stool softener and take twice a day as long as you are requiring postoperative pain medications.  Take with a full glass of water daily.  If you experience loose stools or diarrhea, hold the colace until you stool forms back up.  If your symptoms do not get better within 1 week or if they get worse, check with your doctor.  Dulcolax (bisacodyl) - Pick up over-the-counter and take as directed by the product packaging as needed to assist with the movement of your bowels.  Take with a full glass of water.  Use this product as needed if not relieved by Colace only.   MiraLax (polyethylene glycol) - Pick up over-the-counter to have on hand.  MiraLax is a solution that will increase the amount of water in your bowels to assist with bowel movements.  Take as directed and can mix with a glass of water, juice, soda, coffee, or tea.  Take if you go more than two days without a movement. Do not use MiraLax more than once per day. Call your doctor if you are still constipated or irregular after using this medication for 7 days in a row.  If you continue to have problems with postoperative constipation, please contact the office for further assistance and recommendations.  If you experience "the worst abdominal pain ever" or develop nausea or vomiting, please contact the office immediatly for further recommendations for treatment.  ITCHING  If you experience itching with your medications, try taking only a single pain pill, or even half a pain pill at a time.  You can also use Benadryl over the counter for itching or also to help with sleep.   TED HOSE STOCKINGS Wear the elastic stockings on both legs for three weeks  following surgery during the day but you may remove then at night for sleeping.  MEDICATIONS See your medication summary on the "After Visit Summary" that the nursing staff will review with you prior to discharge.  You may have some home medications which will  be placed on hold until you complete the course of blood thinner medication.  It is important for you to complete the blood thinner medication as prescribed by your surgeon.  Continue your approved medications as instructed at time of discharge.  PRECAUTIONS If you experience chest pain or shortness of breath - call 911 immediately for transfer to the hospital emergency department.  If you develop a fever greater that 101 F, purulent drainage from wound, increased redness or drainage from wound, foul odor from the wound/dressing, or calf pain - CONTACT YOUR SURGEON.                                                   FOLLOW-UP APPOINTMENTS Make sure you keep all of your appointments after your operation with your surgeon and caregivers. You should call the office at the above phone number and make an appointment for approximately two weeks after the date of your surgery or on the date instructed by your surgeon outlined in the "After Visit Summary".   RANGE OF MOTION AND STRENGTHENING EXERCISES  Rehabilitation of the knee is important following a knee injury or an operation. After just a few days of immobilization, the muscles of the thigh which control the knee become weakened and shrink (atrophy). Knee exercises are designed to build up the tone and strength of the thigh muscles and to improve knee motion. Often times heat used for twenty to thirty minutes before working out will loosen up your tissues and help with improving the range of motion but do not use heat for the first two weeks following surgery. These exercises can be done on a training (exercise) mat, on the floor, on a table or on a bed. Use what ever works the best and is most  comfortable for you Knee exercises include:  Leg Lifts - While your knee is still immobilized in a splint or cast, you can do straight leg raises. Lift the leg to 60 degrees, hold for 3 sec, and slowly lower the leg. Repeat 10-20 times 2-3 times daily. Perform this exercise against resistance later as your knee gets better.  Quad and Hamstring Sets - Tighten up the muscle on the front of the thigh (Quad) and hold for 5-10 sec. Repeat this 10-20 times hourly. Hamstring sets are done by pushing the foot backward against an object and holding for 5-10 sec. Repeat as with quad sets.  Leg Slides: Lying on your back, slowly slide your foot toward your buttocks, bending your knee up off the floor (only go as far as is comfortable). Then slowly slide your foot back down until your leg is flat on the floor again. Angel Wings: Lying on your back spread your legs to the side as far apart as you can without causing discomfort.  A rehabilitation program following serious knee injuries can speed recovery and prevent re-injury in the future due to weakened muscles. Contact your doctor or a physical therapist for more information on knee rehabilitation.   IF YOU ARE TRANSFERRED TO A SKILLED REHAB FACILITY If the patient is transferred to a skilled rehab facility following release from the hospital, a list of the current medications will be sent to the facility for the patient to continue.  When discharged from the skilled rehab facility, please have the facility set up the patient's Home Health Physical  Therapy prior to being released. Also, the skilled facility will be responsible for providing the patient with their medications at time of release from the facility to include their pain medication, the muscle relaxants, and their blood thinner medication. If the patient is still at the rehab facility at time of the two week follow up appointment, the skilled rehab facility will also need to assist the patient in arranging  follow up appointment in our office and any transportation needs.  MAKE SURE YOU:  Understand these instructions.  Get help right away if you are not doing well or get worse.    Pick up stool softner and laxative for home use following surgery while on pain medications. Do not submerge incision under water. Please use good hand washing techniques while changing dressing each day. May shower starting three days after surgery. Please use a clean towel to pat the incision dry following showers. Continue to use ice for pain and swelling after surgery. Do not use any lotions or creams on the incision until instructed by your surgeon.   Do not put a pillow under the knee. Place it under the heel.   Complete by:  As directed    Driving restrictions   Complete by:  As directed    No driving for two weeks   TED hose   Complete by:  As directed    Use stockings (TED hose) for three weeks on both leg(s).  You may remove them at night for sleeping.   Weight bearing as tolerated   Complete by:  As directed      Allergies as of 02/24/2018      Reactions   Codeine Nausea And Vomiting   Pregabalin Other (See Comments)   Edema, weight gain   Penicillins Rash   DID THE REACTION INVOLVE: Swelling of the face/tongue/throat, SOB, or low BP? No Sudden or severe rash/hives, skin peeling, or the inside of the mouth or nose? Unknown Did it require medical treatment? Unknown When did it last happen?childhood allergy  If all above answers are "NO", may proceed with cephalosporin use.      Medication List    STOP taking these medications   estradiol 0.1 MG/24HR patch Commonly known as:  VIVELLE-DOT   ibuprofen 200 MG tablet Commonly known as:  ADVIL,MOTRIN   oxyCODONE-acetaminophen 5-325 MG tablet Commonly known as:  PERCOCET/ROXICET     TAKE these medications   aspirin 325 MG EC tablet Take 1 tablet (325 mg total) by mouth 2 (two) times daily for 20 days. Then take one 81 mg aspirin  once a day for three weeks. Then discontinue aspirin.   cetirizine 10 MG tablet Commonly known as:  ZYRTEC Take 10 mg by mouth daily as needed for allergies.   cyclobenzaprine 10 MG tablet Commonly known as:  FLEXERIL Take 2.5 mg by mouth at bedtime.   gabapentin 300 MG capsule Commonly known as:  NEURONTIN Take 1 capsule (300 mg total) by mouth 3 (three) times daily. What changed:    medication strength  how much to take  when to take this  additional instructions   HYDROmorphone 2 MG tablet Commonly known as:  DILAUDID Take 1-2 tablets (2-4 mg total) by mouth every 6 (six) hours as needed for moderate pain.   levothyroxine 100 MCG tablet Commonly known as:  SYNTHROID, LEVOTHROID TAKE 1 TABLET (100 MCG TOTAL) BY MOUTH DAILY BEFORE BREAKFAST.   meclizine 25 MG tablet Commonly known as:  ANTIVERT Take 1 tablet (25  mg total) by mouth 3 (three) times daily as needed for dizziness.   methocarbamol 500 MG tablet Commonly known as:  ROBAXIN Take 1 tablet (500 mg total) by mouth every 6 (six) hours as needed for muscle spasms. Notes to patient:  Muscle relaxer   NUVIGIL 50 MG tablet Generic drug:  Armodafinil Take 50 mg by mouth See admin instructions. Take 50 mg in the morning and may take a second 50 mg dose as needed for alertness   omeprazole 20 MG capsule Commonly known as:  PRILOSEC Take 1 capsule (20 mg total) by mouth daily.   ondansetron 4 MG tablet Commonly known as:  ZOFRAN Take 1 tablet (4 mg total) by mouth as needed. What changed:  reasons to take this   PARoxetine 20 MG tablet Commonly known as:  PAXIL Take 1 tablet (20 mg total) by mouth daily. What changed:  when to take this   progesterone 200 MG capsule Commonly known as:  PROMETRIUM Take 200 mg by mouth daily.   rizatriptan 10 MG tablet Commonly known as:  MAXALT TAKE 1 TABLET BY MOUTH DAILY AS NEEDED FOR HEADACHE   rosuvastatin 10 MG tablet Commonly known as:  CRESTOR Take 1 tablet (10  mg total) by mouth at bedtime.   valACYclovir 1000 MG tablet Commonly known as:  VALTREX Take 2 tabs at onset of cold sore and repeat in 12 hours            Durable Medical Equipment  (From admission, onward)         Start     Ordered   02/23/18 1435  For home use only DME 3 n 1  Once     02/23/18 1435           Discharge Care Instructions  (From admission, onward)         Start     Ordered   02/23/18 0000  Weight bearing as tolerated     02/23/18 0747   02/23/18 0000  Change dressing    Comments:  Change dressing on Wednesday, then change the dressing daily with sterile 4 x 4 inch gauze dressing and apply TED hose.   02/23/18 0747         Follow-up Information    Ollen Gross, MD. Schedule an appointment as soon as possible for a visit on 03/09/2018.   Specialty:  Orthopedic Surgery Contact information: [address] [place] [address] [phone]           Signed: Arther Abbott, PA-C Orthopedic Surgery 02/24/2018, 3:58 PM

## 2018-03-08 DIAGNOSIS — Z96652 Presence of left artificial knee joint: Secondary | ICD-10-CM | POA: Insufficient documentation

## 2018-05-17 ENCOUNTER — Other Ambulatory Visit: Payer: Self-pay | Admitting: Family Medicine

## 2018-06-17 ENCOUNTER — Ambulatory Visit (INDEPENDENT_AMBULATORY_CARE_PROVIDER_SITE_OTHER): Payer: 59 | Admitting: Family Medicine

## 2018-06-17 ENCOUNTER — Other Ambulatory Visit: Payer: Self-pay

## 2018-06-17 ENCOUNTER — Encounter: Payer: Self-pay | Admitting: Family Medicine

## 2018-06-17 VITALS — BP 120/71 | Temp 97.4°F | Wt 160.0 lb

## 2018-06-17 DIAGNOSIS — E039 Hypothyroidism, unspecified: Secondary | ICD-10-CM | POA: Diagnosis not present

## 2018-06-17 DIAGNOSIS — R197 Diarrhea, unspecified: Secondary | ICD-10-CM | POA: Diagnosis not present

## 2018-06-17 NOTE — Patient Instructions (Signed)
Diarrhea, Adult  Diarrhea is frequent loose and watery bowel movements. Diarrhea can make you feel weak and cause you to become dehydrated. Dehydration can make you tired and thirsty, cause you to have a dry mouth, and decrease how often you urinate.  Diarrhea typically lasts 2-3 days. However, it can last longer if it is a sign of something more serious. It is important to treat your diarrhea as told by your health care provider.  Follow these instructions at home:  Eating and drinking         Follow these recommendations as told by your health care provider:  · Take an oral rehydration solution (ORS). This is an over-the-counter medicine that helps return your body to its normal balance of nutrients and water. It is found at pharmacies and retail stores.  · Drink plenty of fluids, such as water, ice chips, diluted fruit juice, and low-calorie sports drinks. You can drink milk also, if desired.  · Avoid drinking fluids that contain a lot of sugar or caffeine, such as energy drinks, sports drinks, and soda.  · Eat bland, easy-to-digest foods in small amounts as you are able. These foods include bananas, applesauce, rice, lean meats, toast, and crackers.  · Avoid alcohol.  · Avoid spicy or fatty foods.    Medicines  · Take over-the-counter and prescription medicines only as told by your health care provider.  · If you were prescribed an antibiotic medicine, take it as told by your health care provider. Do not stop using the antibiotic even if you start to feel better.  General instructions    · Wash your hands often using soap and water. If soap and water are not available, use a hand sanitizer. Others in the household should wash their hands as well. Hands should be washed:  ? After using the toilet or changing a diaper.  ? Before preparing, cooking, or serving food.  ? While caring for a sick person or while visiting someone in a hospital.  · Drink enough fluid to keep your urine pale yellow.  · Rest at home while  you recover.  · Watch your condition for any changes.  · Take a warm bath to relieve any burning or pain from frequent diarrhea episodes.  · Keep all follow-up visits as told by your health care provider. This is important.  Contact a health care provider if:  · You have a fever.  · Your diarrhea gets worse.  · You have new symptoms.  · You cannot keep fluids down.  · You feel light-headed or dizzy.  · You have a headache.  · You have muscle cramps.  Get help right away if:  · You have chest pain.  · You feel extremely weak or you faint.  · You have bloody or black stools or stools that look like tar.  · You have severe pain, cramping, or bloating in your abdomen.  · You have trouble breathing or you are breathing very quickly.  · Your heart is beating very quickly.  · Your skin feels cold and clammy.  · You feel confused.  · You have signs of dehydration, such as:  ? Dark urine, very little urine, or no urine.  ? Cracked lips.  ? Dry mouth.  ? Sunken eyes.  ? Sleepiness.  ? Weakness.  Summary  · Diarrhea is frequent loose and watery bowel movements. Diarrhea can make you feel weak and cause you to become dehydrated.  · Drink enough fluids   to keep your urine pale yellow.  · Make sure that you wash your hands after using the toilet. If soap and water are not available, use hand sanitizer.  · Contact a health care provider if your diarrhea gets worse or you have new symptoms.  · Get help right away if you have signs of dehydration.  This information is not intended to replace advice given to you by your health care provider. Make sure you discuss any questions you have with your health care provider.  Document Released: 01/10/2002 Document Revised: 06/26/2017 Document Reviewed: 06/26/2017  Elsevier Interactive Patient Education © 2019 [employer]vier Inc[employer]

## 2018-06-17 NOTE — Progress Notes (Signed)
Virtual Visit via Video Note  Subjective  CC:  Chief Complaint  Patient presents with  . Diarrhea    Started about 10 days ago.. She has tried amodium that stopped it for about 2 days but once stopping the diarrhea returned.. Reports some dull headache, nausea, and ocassional abdominal pain. Denies blood in stool and fever     I connected with Shaylan Herring Oxley on 06/17/18 at  9:40 AM EDT by a video enabled telemedicine application and verified that I am speaking with the correct person using two identifiers. Location patient: Home Location provider: [facility] [address], Office Persons participating in the virtual visit: Claudia Herring Victory Dakin, MD Rita Ohara, CMA  I discussed the limitations of evaluation and management by telemedicine and the availability of in person appointments. The patient expressed understanding and agreed to proceed. HPI: Claudia Luna is a 64 y.o. female who was contacted today to address the problems listed above in the chief complaint. . 63 year old female with no history of abdominal illness complains of 10-day history of loose watery and multiple stools per day.  Started suddenly.  Not associated with fevers, chills, abdominal pain, melena, bright red blood in stool, mucus, or symptoms of dehydration.  Overall she feels well but unless she takes Imodium, she has multiple stools per day and goes to the bathroom right after eating.  She is still stooling frequently even with decreased intake.  She has been on a probiotic since January.  In January she had her knee replaced and was on IV antibiotics preoperatively but no other recent antibiotic use.  She has no history of inflammatory bowel disease, IBS and there is no family history of such.  Her last colonoscopy was in 2010 and was normal at that time.  She denies weight loss. . She has hypothyroidism and fibromyalgia: She has been seeing a specialist in [place]  [place] who is been following both.  She reports that her thyroid was normal in February.  She is on T4 and T3 supplementation.  She denies palpitations, heat intolerance, weight loss or tremor. Marland Kitchen Health maintenance: She is due for mammogram, this is been postponed to COVID-19 restrictions.  She is due for complete physical. Assessment  1. Diarrhea of presumed infectious origin   2. Hypothyroidism (acquired)      Plan   Diarrhea, presumed infectious: Educated regarding differential.  At this time we will check stool studies and blood work.  Check KUB.  Continue probiotic and Imodium.  Once labs are back will consider empiric antibiotics versus other treatment depending on results.  She currently is stable.  If symptoms worsen, she will need further evaluation and possibly GI consult.  She will be due for her colonoscopy in August as well.  Discussed red flags.  Patient will come in to get lab work, x-ray and pick up stool collection kits.  Hypothyroidism: Reportedly stable.  Will recheck to ensure today.  We will request records from her specialist in [place].  Health maintenance: We will schedule physical once we can bring her back to the office safely. I discussed the assessment and treatment plan with the patient. The patient was provided an opportunity to ask questions and all were answered. The patient agreed with the plan and demonstrated an understanding of the instructions.   The patient was advised to call back or seek an in-person evaluation if the symptoms worsen or if the condition fails to improve as  anticipated. Interactive audio and video telecommunications were attempted between this provider and patient, however failed, due to patient having technical difficulties OR patient did not have access to video capability.  We continued and completed visit with audio only.    Follow up: PRN, needs physical. Visit date not found  No orders of the defined types were placed in this  encounter.     I reviewed the patients updated PMH, FH, and SocHx.    Patient Active Problem List   Diagnosis Date Noted  . Chronic narcotic dependence (HCC) 02/17/2017    Priority: High  . Family history of breast cancer in first degree relative 02/17/2017    Priority: High  . Fibromyalgia, primary 12/24/2016    Priority: High  . GAD (generalized anxiety disorder) 12/24/2016    Priority: High  . Chronic pain syndrome 12/24/2016    Priority: High  . Mixed hyperlipidemia 01/19/2007    Priority: High  . Hypothyroidism (acquired) 09/01/2006    Priority: High  . OA (osteoarthritis) of knee 07/24/2017    Priority: Medium  . History of herniated intervertebral disc 12/24/2016    Priority: Medium  . Migraine headache without aura 12/06/2015    Priority: Medium  . 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    Priority: Medium  . Essential tremor 02/17/2017    Priority: Low  . Vertigo 12/24/2016    Priority: Low  . 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 Upper airway cough syndrome 04/13/2012    Priority: Low  . Reactive airway disease that is not asthma 12/29/2007    Priority: Low   Current Meds  Medication Sig  . Armodafinil (NUVIGIL) 50 MG tablet Take 50 mg by mouth See admin instructions. Take 50 mg in the morning and may take a second 50 mg dose as needed for alertness  . cetirizine (ZYRTEC) 10 MG tablet Take 10 mg by mouth daily as needed for allergies.  Marland Kitchen. gabapentin (NEURONTIN) 300 MG capsule Take 1 capsule (300 mg total) by mouth 3 (three) times daily.  Marland Kitchen. levothyroxine (SYNTHROID, LEVOTHROID) 100 MCG tablet TAKE 1 TABLET (100 MCG TOTAL) BY MOUTH DAILY BEFORE BREAKFAST.  Marland Kitchen. omeprazole (PRILOSEC) 20 MG capsule Take 1 capsule (20 mg total) by mouth daily.  Marland Kitchen. PARoxetine (PAXIL) 20 MG tablet Take 1 tablet (20 mg total) by mouth daily. (Patient taking differently: Take 20 mg by mouth at bedtime. )  . progesterone (PROMETRIUM) 200 MG capsule Take 200 mg by mouth daily.  .  rizatriptan (MAXALT) 10 MG tablet TAKE 1 TABLET BY MOUTH DAILY AS NEEDED FOR HEADACHE  . rosuvastatin (CRESTOR) 10 MG tablet Take 1 tablet (10 mg total) by mouth at bedtime.  . tapentadol (NUCYNTA) 50 MG tablet Take 50 mg by mouth every 4 (four) hours as needed.  . valACYclovir (VALTREX) 1000 MG tablet Take 2 tabs at onset of cold sore and repeat in 12 hours  . [DISCONTINUED] cyclobenzaprine (FLEXERIL) 10 MG tablet Take 2.5 mg by mouth at bedtime.    Allergies: Patient is allergic to codeine; pregabalin; and penicillins. Family History: Patient family history includes Breast cancer in her maternal aunt, maternal grandmother, and mother; Clotting disorder in her mother; Coronary artery disease in her father; GER disease in her sister; High Cholesterol in her sister; Hyperlipidemia in her father; Hypertension in her father and sister; Leukemia in her maternal aunt; Peripheral vascular disease in her father; Stroke in her father and mother. Social History:  Patient  reports that she has never  smoked. She has never used smokeless tobacco. She reports current alcohol use. She reports that she does not use drugs.  Review of Systems: Constitutional: Negative for fever malaise or anorexia Cardiovascular: negative for chest pain Respiratory: negative for SOB or persistent cough Gastrointestinal: negative for abdominal pain  OBJECTIVE Vitals: BP 120/71   Temp (!) 97.4 F (36.3 C)   Wt 160 lb (72.6 kg)   BMI 30.23 kg/m  General: no acute distress , A&Ox3, appears well After initially contacting patient via video, her system failed and we changed to a telephone visit.  Willow Ora, MD

## 2018-06-18 ENCOUNTER — Other Ambulatory Visit: Payer: 59

## 2018-06-18 NOTE — Addendum Note (Signed)
Addended by: Charna Elizabeth on: 06/18/2018 12:45 PM   Modules accepted: Orders

## 2018-08-03 ENCOUNTER — Other Ambulatory Visit: Payer: Self-pay | Admitting: *Deleted

## 2018-08-03 MED ORDER — ROSUVASTATIN CALCIUM 10 MG PO TABS: 10.0000 mg | ORAL_TABLET | Freq: Every day | ORAL | 3 refills | Status: DC

## 2018-08-25 ENCOUNTER — Encounter: Payer: Self-pay | Admitting: Gastroenterology

## 2018-08-31 ENCOUNTER — Other Ambulatory Visit: Payer: Self-pay | Admitting: *Deleted

## 2018-08-31 MED ORDER — LEVOTHYROXINE SODIUM 100 MCG PO TABS: 100.0000 ug | ORAL_TABLET | Freq: Every day | ORAL | 1 refills | Status: DC

## 2018-09-02 ENCOUNTER — Other Ambulatory Visit: Payer: Self-pay | Admitting: *Deleted

## 2018-09-02 DIAGNOSIS — G43909 Migraine, unspecified, not intractable, without status migrainosus: Secondary | ICD-10-CM

## 2018-09-02 MED ORDER — PAROXETINE HCL 20 MG PO TABS: 20.0000 mg | ORAL_TABLET | Freq: Every day | ORAL | 0 refills | Status: DC

## 2018-09-02 MED ORDER — RIZATRIPTAN BENZOATE 10 MG PO TABS: ORAL_TABLET | ORAL | 6 refills | Status: DC

## 2018-09-07 ENCOUNTER — Other Ambulatory Visit: Payer: Self-pay | Admitting: Family Medicine

## 2018-10-11 ENCOUNTER — Other Ambulatory Visit: Payer: Self-pay | Admitting: Family Medicine

## 2018-10-29 ENCOUNTER — Encounter: Payer: Self-pay | Admitting: Family Medicine

## 2018-10-29 ENCOUNTER — Ambulatory Visit: Payer: 59 | Admitting: Family Medicine

## 2018-10-29 ENCOUNTER — Other Ambulatory Visit: Payer: Self-pay

## 2018-10-29 ENCOUNTER — Ambulatory Visit: Payer: Self-pay | Admitting: *Deleted

## 2018-10-29 VITALS — BP 100/70 | HR 135 | Temp 97.3°F | Resp 16 | Ht 61.0 in | Wt 160.8 lb

## 2018-10-29 DIAGNOSIS — E039 Hypothyroidism, unspecified: Secondary | ICD-10-CM

## 2018-10-29 DIAGNOSIS — R000 Tachycardia, unspecified: Secondary | ICD-10-CM | POA: Diagnosis not present

## 2018-10-29 DIAGNOSIS — J309 Allergic rhinitis, unspecified: Secondary | ICD-10-CM | POA: Diagnosis not present

## 2018-10-29 DIAGNOSIS — H8110 Benign paroxysmal vertigo, unspecified ear: Secondary | ICD-10-CM | POA: Diagnosis not present

## 2018-10-29 DIAGNOSIS — Z23 Encounter for immunization: Secondary | ICD-10-CM

## 2018-10-29 MED ORDER — ONDANSETRON HCL 4 MG PO TABS: 4.0000 mg | ORAL_TABLET | Freq: Three times a day (TID) | ORAL | 0 refills | Status: DC | PRN

## 2018-10-29 NOTE — Progress Notes (Signed)
Subjective  CC:  Chief Complaint  Patient presents with  . Dizziness    Started about 1 1/2 wks ago around the sametime as an ear infection. Reports nausea and headaches that comes and goes/ takes Maxalt with relief, and room spinning.. Denies vomiting.. Meclizine is not helping    HPI: Claudia Luna is a 63 y.o. female who presents to the office today to address the problems listed above in the chief complaint.  62 with multiple problems listed below presents with persistent vertigo x 1-2 weeks. Has long h/o recalcitrant BPPV but typically will resolve in days with meclizine. Symptoms are similar in quality and severity to her typical bouts of vertigo. No other neuro sxs.   She has been seeing a specialist in [place] and has been treated for yeast, parasite infections. Labs reveal last TSH was low.   Denies palpitations or cp. + "warmer than usual" w/o flushes. + tremor. No weight loss. + DOE. No cp.   Chronic allergies with clear rhinorrhea, pnd and intermittent cough w/o sinus pain, purulent drainage or dental pain  Overdue for cpe.   Assessment  1. Benign paroxysmal positional vertigo, unspecified laterality   2. Tachycardia   3. Hypothyroidism (acquired)   4. Chronic allergic rhinitis      Plan   BPPV:  Persistent. Set up PT/vestibular rehab  Tachy and hyperthyroid by recent labs. Recheck and adjust meds  Low bicarb and hypernatremia on recent labs as well: recheck today  Allergy meds started  Follow up: No follow-ups on file.  Visit date not found  Orders Placed This Encounter  Procedures  . EKG 12-Lead   Meds ordered this encounter  Medications  . ondansetron (ZOFRAN) 4 MG tablet    Sig: Take 1 tablet (4 mg total) by mouth every 8 (eight) hours as needed for nausea or vomiting.    Dispense:  20 tablet    Refill:  0      I reviewed the patients updated PMH, FH, and SocHx.    Patient Active Problem List   Diagnosis Date Noted  . Chronic narcotic  dependence ([place]) 02/17/2017    Priority: High  . Family history of breast cancer in first degree relative 02/17/2017    Priority: High  . Fibromyalgia, primary 12/24/2016    Priority: High  . GAD (generalized anxiety disorder) 12/24/2016    Priority: High  . Chronic pain syndrome 12/24/2016    Priority: High  . Mixed hyperlipidemia 01/19/2007    Priority: High  . Hypothyroidism (acquired) 09/01/2006    Priority: High  . OA (osteoarthritis) of knee 07/24/2017    Priority: Medium  . History of herniated intervertebral disc 12/24/2016    Priority: Medium  . Migraine headache without aura 12/06/2015    Priority: Medium  . 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    Priority: Medium  . Essential tremor 02/17/2017    Priority: Low  . Vertigo 12/24/2016    Priority: Low  . 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 Upper airway cough syndrome 04/13/2012    Priority: Low  . Reactive airway disease that is not asthma 12/29/2007    Priority: Low   Current Meds  Medication Sig  . Armodafinil (NUVIGIL) 50 MG tablet Take 50 mg by mouth See admin instructions. Take 50 mg in the morning and may take a second 50 mg dose as needed for alertness  . cetirizine (ZYRTEC) 10 MG tablet Take 10 mg by mouth daily as needed  for allergies.  Marland Kitchen gabapentin (NEURONTIN) 300 MG capsule Take 1 capsule (300 mg total) by mouth 3 (three) times daily.  Marland Kitchen levothyroxine (SYNTHROID) 100 MCG tablet Take 1 tablet (100 mcg total) by mouth daily before breakfast.  . meclizine (ANTIVERT) 25 MG tablet Take 1 tablet (25 mg total) by mouth 3 (three) times daily as needed for dizziness.  . methocarbamol (ROBAXIN) 500 MG tablet Take 500 mg by mouth at bedtime as needed for muscle spasms.  Marland Kitchen omeprazole (PRILOSEC) 20 MG capsule Take 1 capsule (20 mg total) by mouth daily.  Marland Kitchen PARoxetine (PAXIL) 20 MG tablet Take 1 tablet (20 mg total) by mouth at bedtime.  Marland Kitchen PRESCRIPTION MEDICATION Take 1 capsule by mouth daily. T3 Liothyronine  20 mcg  . progesterone (PROMETRIUM) 200 MG capsule Take 200 mg by mouth daily.  . rizatriptan (MAXALT) 10 MG tablet TAKE 1 TABLET BY MOUTH DAILY AS NEEDED FOR HEADACHE  . rosuvastatin (CRESTOR) 10 MG tablet Take 1 tablet (10 mg total) by mouth at bedtime.  . tapentadol (NUCYNTA) 50 MG tablet Take 50 mg by mouth every 4 (four) hours as needed.  . valACYclovir (VALTREX) 1000 MG tablet Take 2 tabs at onset of cold sore and repeat in 12 hours  . Vitamin D, Ergocalciferol, (DRISDOL) 1.25 MG (50000 UT) CAPS capsule TAKE 1 CAPSULE BY MOUTH 2 TIMES A WEEK    Allergies: Patient is allergic to codeine; pregabalin; and penicillins. Family History: Patient family history includes Breast cancer in her maternal aunt, maternal grandmother, and mother; Clotting disorder in her mother; Coronary artery disease in her father; GER disease in her sister; High Cholesterol in her sister; Hyperlipidemia in her father; Hypertension in her father and sister; Leukemia in her maternal aunt; Peripheral vascular disease in her father; Stroke in her father and mother. Social History:  Patient  reports that she has never smoked. She has never used smokeless tobacco. She reports current alcohol use. She reports that she does not use drugs.  Review of Systems: Constitutional: Negative for fever malaise or anorexia Cardiovascular: negative for chest pain Respiratory: negative for SOB or persistent cough Gastrointestinal: negative for abdominal pain  Objective  Vitals: BP 100/70   Pulse (!) 135   Temp (!) 97.3 F (36.3 C) (Tympanic)   Resp 16   Ht 5\' 1"  (1.549 m)   Wt 160 lb 12.8 oz (72.9 kg)   SpO2 96%   BMI 30.38 kg/m  General: no acute distress , A&Ox3, nontoxic,  HEENT: PEERL, conjunctiva normal, Oropharynx moist,neck is supple, TMs clear bilaterally Cardiovascular:  Tachy without murmur or gallop.  Respiratory:  Good breath sounds bilaterally, CTAB with normal respiratory effort Skin:  Warm, no rashes Neuro:  + vertigo with head mvt w/o nystagmus, + essential tremor. Nonfocal exam  EKG: sinus tach with nonspecific t wave changes. Compared to old ekgs: similar   Commons side effects, risks, benefits, and alternatives for medications and treatment plan prescribed today were discussed, and the patient expressed understanding of the given instructions. Patient is instructed to call or message via MyChart if he/she has any questions or concerns regarding our treatment plan. No barriers to understanding were identified. We discussed Red Flag symptoms and signs in detail. Patient expressed understanding regarding what to do in case of urgent or emergency type symptoms.   Medication list was reconciled, printed and provided to the patient in AVS. Patient instructions and summary information was reviewed with the patient as documented in the AVS. This note was prepared  with assistance of Systems analyst. Occasional wrong-word or sound-a-like substitutions may have occurred due to the inherent limitations of voice recognition software

## 2018-10-29 NOTE — Telephone Encounter (Signed)
   Reason for Disposition . [1] MODERATE dizziness (e.g., vertigo; feels very unsteady, interferes with normal activities) AND [2] has NOT been evaluated by physician for this  Answer Assessment - Initial Assessment Questions 1. DESCRIPTION: "Describe your dizziness."      Room spinning 2. VERTIGO: "Do you feel like either you or the room is spinning or tilting?"     Yes  3. LIGHTHEADED: "Do you feel lightheaded?" (e.g., somewhat faint, woozy, weak upon standing)    Does not feel faint or woozy, no nausea  4. SEVERITY: "How bad is it?"  "Can you walk?"   - MILD - Feels unsteady but walking normally.   - MODERATE - Feels very unsteady when walking, but not falling; interferes with normal activities (e.g., school, work) .   - SEVERE - Unable to walk without falling (requires assistance).     Moderate  5. ONSET:  "When did the dizziness begin?"     One week ago 6. AGGRAVATING FACTORS: "Does anything make it worse?" (e.g., standing, change in head position)     Standing or laying down 7. CAUSE: "What do you think is causing the dizziness?"     Unsure  8. RECURRENT SYMPTOM: "Have you had dizziness before?" If so, ask: "When was the last time?" "What happened that time?"     Has had vertigo before more than 1 year ago 9. OTHER SYMPTOMS: "Do you have any other symptoms?" (e.g., headache, weakness, numbness, vomiting, earache)     Headache yesterday, earache early last week  10. PREGNANCY: "Is there any chance you are pregnant?" "When was your last menstrual period?"       N/A  Protocols used: [place] with patient's husband, he states that patient has had symptoms of vertigo since last Friday.  When she stands up or lays down she feels like the room is spinning.  Rates dizziness as moderate.  She has experienced vertigo in the past, but it has been years ago.  Denies any weakness, numbness, vomiting.  She did have a headache yesterday, and this was relieved by taking  Maxalt.  She also had an earache early last week.  Due to moderate dizziness recommend that patient be seen for an appointment today with PCP.  Transferred call to PCP office to schedule appointment.  Appointment scheduled for today at 2:40 with Dr. Jonni Sanger today.

## 2018-10-29 NOTE — Patient Instructions (Addendum)
Please return in 2 weeks to go over results and recheck.  I've referred your to PT for vestibular rehab to help your dizziness.   If you have any questions or concerns, please don't hesitate to send me a message via MyChart or call the office at [phone]. Thank you for visiting with [place] today! It's our pleasure caring for you.

## 2018-10-30 ENCOUNTER — Other Ambulatory Visit: Payer: Self-pay | Admitting: Family Medicine

## 2018-10-30 ENCOUNTER — Encounter: Payer: Self-pay | Admitting: Family Medicine

## 2018-10-30 LAB — CBC WITH DIFFERENTIAL/PLATELET
Absolute Monocytes: 667 cells/uL (ref 200–950)
Basophils Absolute: 81 cells/uL (ref 0–200)
Basophils Relative: 0.8 %
Eosinophils Absolute: 131 cells/uL (ref 15–500)
Eosinophils Relative: 1.3 %
HCT: 49.2 % — ABNORMAL HIGH (ref 35.0–45.0)
Hemoglobin: 17 g/dL — ABNORMAL HIGH (ref 11.7–15.5)
Lymphs Abs: 2414 cells/uL (ref 850–3900)
MCH: 31 pg (ref 27.0–33.0)
MCHC: 34.6 g/dL (ref 32.0–36.0)
MCV: 89.6 fL (ref 80.0–100.0)
MPV: 12.2 fL (ref 7.5–12.5)
Monocytes Relative: 6.6 %
Neutro Abs: 6807 cells/uL (ref 1500–7800)
Neutrophils Relative %: 67.4 %
Platelets: 347 10*3/uL (ref 140–400)
RBC: 5.49 10*6/uL — ABNORMAL HIGH (ref 3.80–5.10)
RDW: 12.6 % (ref 11.0–15.0)
Total Lymphocyte: 23.9 %
WBC: 10.1 10*3/uL (ref 3.8–10.8)

## 2018-10-30 LAB — T3: T3, Total: 207 ng/dL — ABNORMAL HIGH (ref 76–181)

## 2018-10-30 LAB — COMPREHENSIVE METABOLIC PANEL
AG Ratio: 1.6 (calc) (ref 1.0–2.5)
ALT: 22 U/L (ref 6–29)
AST: 22 U/L (ref 10–35)
Albumin: 4.5 g/dL (ref 3.6–5.1)
Alkaline phosphatase (APISO): 86 U/L (ref 37–153)
BUN: 14 mg/dL (ref 7–25)
CO2: 21 mmol/L (ref 20–32)
Calcium: 10.3 mg/dL (ref 8.6–10.4)
Chloride: 103 mmol/L (ref 98–110)
Creat: 0.89 mg/dL (ref 0.50–0.99)
Globulin: 2.8 g/dL (calc) (ref 1.9–3.7)
Glucose, Bld: 103 mg/dL — ABNORMAL HIGH (ref 65–99)
Potassium: 4.3 mmol/L (ref 3.5–5.3)
Sodium: 138 mmol/L (ref 135–146)
Total Bilirubin: 0.5 mg/dL (ref 0.2–1.2)
Total Protein: 7.3 g/dL (ref 6.1–8.1)

## 2018-10-30 LAB — TSH: TSH: 0.23 mIU/L — ABNORMAL LOW (ref 0.40–4.50)

## 2018-10-30 LAB — T4, FREE: Free T4: 1.1 ng/dL (ref 0.8–1.8)

## 2018-11-03 ENCOUNTER — Telehealth: Payer: Self-pay | Admitting: Family Medicine

## 2018-11-03 MED ORDER — PAROXETINE HCL 20 MG PO TABS: 20.0000 mg | ORAL_TABLET | Freq: Every day | ORAL | 3 refills | Status: DC

## 2018-11-03 MED ORDER — LEVOTHYROXINE SODIUM 88 MCG PO TABS: 88.0000 ug | ORAL_TABLET | Freq: Every day | ORAL | 0 refills | Status: DC

## 2018-11-03 NOTE — Telephone Encounter (Signed)
Will lower levothyroxine dose to 88mcg daily. Will need in office visit in 6 weeks to recheck pulse and lab work. Please schedule. New dose ordered [facility].  She has been on Crestor 10 since 2018, and since I have been seeing her. [facility]. Please have her refill by calling the pharmacy. Lipids were improved on this dose when last checked. Will adjust in future IF needed when we recheck.   paxil has been refilled.

## 2018-11-03 NOTE — Telephone Encounter (Signed)
-----   Message from Layla Barter, [place] sent at 11/03/2018  8:40 AM EDT -----  ----- Message ----- From: Leamon Arnt, MD Sent: 11/01/2018   9:04 AM EDT To: Layla Barter, CMA  Please call patient: labs again show that she is taking too much thyroid medication. The dose needs to be lowered. This has been managed by her specialist in [place]. Please ask if she wants them to manage and she will have to notify them of the new results OR I can lower her dose and have her rechecked in the office here. As well, please clarify what she is taking for her thyroid. This is why her heart rate is so fast: this is NOT good for her long term and the dose needs to be changed.

## 2018-11-03 NOTE — Telephone Encounter (Signed)
LMOVM for pt to return call for Dr. Jonni Sanger recommendations. Also pt is to keep 10/9 and have a 6 wk  F/u.  [place] for [facility] to Discuss results / PCP recommendations / Schedule patient.

## 2018-11-04 NOTE — Telephone Encounter (Signed)
Pt aware of results, wants to schedule f/u when she comes 10/9.

## 2018-11-04 NOTE — Telephone Encounter (Signed)
Pt called to return call. Please advise.  

## 2018-11-08 ENCOUNTER — Other Ambulatory Visit: Payer: Self-pay | Admitting: *Deleted

## 2018-11-08 DIAGNOSIS — G43909 Migraine, unspecified, not intractable, without status migrainosus: Secondary | ICD-10-CM

## 2018-11-08 MED ORDER — RIZATRIPTAN BENZOATE 10 MG PO TABS: ORAL_TABLET | ORAL | 6 refills | Status: DC

## 2018-11-10 ENCOUNTER — Other Ambulatory Visit: Payer: Self-pay | Admitting: Family Medicine

## 2018-11-12 ENCOUNTER — Ambulatory Visit: Payer: 59 | Admitting: Family Medicine

## 2018-12-08 ENCOUNTER — Other Ambulatory Visit: Payer: Self-pay | Admitting: *Deleted

## 2018-12-08 MED ORDER — VALACYCLOVIR HCL 1 G PO TABS: ORAL_TABLET | ORAL | 0 refills | Status: DC

## 2019-01-06 ENCOUNTER — Ambulatory Visit (INDEPENDENT_AMBULATORY_CARE_PROVIDER_SITE_OTHER): Payer: 59 | Admitting: Family Medicine

## 2019-01-06 ENCOUNTER — Encounter: Payer: Self-pay | Admitting: Family Medicine

## 2019-01-06 DIAGNOSIS — R05 Cough: Secondary | ICD-10-CM

## 2019-01-06 DIAGNOSIS — J989 Respiratory disorder, unspecified: Secondary | ICD-10-CM

## 2019-01-06 DIAGNOSIS — B9689 Other specified bacterial agents as the cause of diseases classified elsewhere: Secondary | ICD-10-CM

## 2019-01-06 DIAGNOSIS — R058 Other specified cough: Secondary | ICD-10-CM

## 2019-01-06 DIAGNOSIS — J208 Acute bronchitis due to other specified organisms: Secondary | ICD-10-CM | POA: Diagnosis not present

## 2019-01-06 DIAGNOSIS — E039 Hypothyroidism, unspecified: Secondary | ICD-10-CM | POA: Diagnosis not present

## 2019-01-06 DIAGNOSIS — R0989 Other specified symptoms and signs involving the circulatory and respiratory systems: Secondary | ICD-10-CM | POA: Diagnosis not present

## 2019-01-06 MED ORDER — ALBUTEROL SULFATE HFA 108 (90 BASE) MCG/ACT IN AERS: 2.0000 | INHALATION_SPRAY | RESPIRATORY_TRACT | 0 refills | Status: DC | PRN

## 2019-01-06 MED ORDER — BENZONATATE 200 MG PO CAPS: 200.0000 mg | ORAL_CAPSULE | Freq: Two times a day (BID) | ORAL | 0 refills | Status: DC | PRN

## 2019-01-06 MED ORDER — AZITHROMYCIN 250 MG PO TABS: ORAL_TABLET | ORAL | 0 refills | Status: DC

## 2019-01-06 MED ORDER — PREDNISONE 20 MG PO TABS: ORAL_TABLET | ORAL | 0 refills | Status: DC

## 2019-01-06 NOTE — Progress Notes (Signed)
Virtual Visit via Video Note  Subjective  CC:  Chief Complaint  Patient presents with  . Cough  . Sinusitis     I connected with Claudia Luna on 01/06/19 at 11:00 AM EST by a video enabled telemedicine application and verified that I am speaking with the correct person using two identifiers. Location patient: Home Location provider: [facility] [address] Persons participating in the virtual visit: Hema Herring Joseph Luna, Willow Oraamille L Krishana Lutze, MD Joseph ArtAmber Agner, CMA  I discussed the limitations of evaluation and management by telemedicine and the availability of in person appointments. The patient expressed understanding and agreed to proceed. HPI: Claudia Luna is a 63 y.o. female who was contacted today to address the problems listed above in the chief complaint.  63 yo with h/o allergies restarted on meds in September who did not return for f/u c/o worsening and progressive sxs. Has h/o reactive airways and cough syndrome prior eval by Dr. Sherene SiresWert but hasn't seen him in years. C/o sinus pressure, thick productive cough with wheeze w/o fever: she is taking multiple otc medications including dayquil, sudafed, nyquil and benadryl all together. She no longer has an inhaler and is wheezing. She denies sob, fever, loss of taste or smell, covid exposures or chest pain or gi sxs.   Hypothyroidism: overdue for recheck after decreasing dose  Overdue for cpe.   Assessment  1. Acute bacterial bronchitis   2. Reactive airway disease that is not asthma   3. Upper airway cough syndrome   4. Hypothyroidism (acquired)      Plan   cough:  Treat for bronchitis with reactive airway: zpak, pred, tessalon and education on appropriate use of OTc meds. rec f/u with pulm since has had progressive cough sxs x months. Refilled albuterol. Recheck here in 1-2 weeks if not improving. covid testing if develops covid sxs/fever/loss of taste or smell etc  rec f/u appt for  thyroid and cpe.  I discussed the assessment and treatment plan with the patient. The patient was provided an opportunity to ask questions and all were answered. The patient agreed with the plan and demonstrated an understanding of the instructions.   The patient was advised to call back or seek an in-person evaluation if the symptoms worsen or if the condition fails to improve as anticipated. Follow up: 2-4 weeks  Visit date not found  Meds ordered this encounter  Medications  . predniSONE (DELTASONE) 20 MG tablet    Sig: Take 3 tabs daily for 5 days    Dispense:  15 tablet    Refill:  0  . azithromycin (ZITHROMAX) 250 MG tablet    Sig: Take 2 tabs today, then 1 tab daily for 4 days    Dispense:  1 each    Refill:  0  . benzonatate (TESSALON) 200 MG capsule    Sig: Take 1 capsule (200 mg total) by mouth 2 (two) times daily as needed for cough.    Dispense:  20 capsule    Refill:  0  . albuterol (VENTOLIN HFA) 108 (90 Base) MCG/ACT inhaler    Sig: Inhale 2 puffs into the lungs every 4 (four) hours as needed for wheezing or shortness of breath.    Dispense:  18 g    Refill:  0      I reviewed the patients updated PMH, FH, and SocHx.    Patient Active Problem List   Diagnosis Date Noted  . Chronic  narcotic dependence ([place]) 02/17/2017    Priority: High  . Family history of breast cancer in first degree relative 02/17/2017    Priority: High  . Fibromyalgia, primary 12/24/2016    Priority: High  . GAD (generalized anxiety disorder) 12/24/2016    Priority: High  . Chronic pain syndrome 12/24/2016    Priority: High  . Mixed hyperlipidemia 01/19/2007    Priority: High  . Hypothyroidism (acquired) 09/01/2006    Priority: High  . OA (osteoarthritis) of knee 07/24/2017    Priority: Medium  . History of herniated intervertebral disc 12/24/2016    Priority: Medium  . Migraine headache without aura 12/06/2015    Priority: Medium  . 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    Priority:  Medium  . Essential tremor 02/17/2017    Priority: Low  . Vertigo 12/24/2016    Priority: Low  . 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 Upper airway cough syndrome 04/13/2012    Priority: Low  . Reactive airway disease that is not asthma 12/29/2007    Priority: Low   Current Meds  Medication Sig  . Armodafinil (NUVIGIL) 50 MG tablet Take 50 mg by mouth See admin instructions. Take 50 mg in the morning and may take a second 50 mg dose as needed for alertness  . cetirizine (ZYRTEC) 10 MG tablet Take 10 mg by mouth daily as needed for allergies.  Marland Kitchen gabapentin (NEURONTIN) 300 MG capsule Take 1 capsule (300 mg total) by mouth 3 (three) times daily.  Marland Kitchen levothyroxine (SYNTHROID) 88 MCG tablet Take 1 tablet (88 mcg total) by mouth daily before breakfast.  . meclizine (ANTIVERT) 25 MG tablet Take 1 tablet (25 mg total) by mouth 3 (three) times daily as needed for dizziness.  . methocarbamol (ROBAXIN) 500 MG tablet Take 500 mg by mouth at bedtime as needed for muscle spasms.  Marland Kitchen omeprazole (PRILOSEC) 20 MG capsule Take 1 capsule (20 mg total) by mouth daily.  . ondansetron (ZOFRAN) 4 MG tablet Take 1 tablet (4 mg total) by mouth every 8 (eight) hours as needed for nausea or vomiting.  Marland Kitchen PARoxetine (PAXIL) 20 MG tablet Take 1 tablet (20 mg total) by mouth at bedtime.  Marland Kitchen PRESCRIPTION MEDICATION Take 1 capsule by mouth daily. T3 Liothyronine 20 mcg  . progesterone (PROMETRIUM) 200 MG capsule Take 200 mg by mouth daily.  . rizatriptan (MAXALT) 10 MG tablet TAKE 1 TABLET BY MOUTH DAILY AS NEEDED FOR HEADACHE  . rosuvastatin (CRESTOR) 10 MG tablet Take 1 tablet (10 mg total) by mouth at bedtime.  . tapentadol (NUCYNTA) 50 MG tablet Take 50 mg by mouth every 4 (four) hours as needed.  . valACYclovir (VALTREX) 1000 MG tablet Take 2 tabs at onset of cold sore and repeat in 12 hours  . Vitamin D, Ergocalciferol, (DRISDOL) 1.25 MG (50000 UT) CAPS capsule TAKE 1 CAPSULE BY MOUTH 2 TIMES A WEEK     Allergies: Patient is allergic to codeine; pregabalin; and penicillins. Family History: Patient family history includes Breast cancer in her maternal aunt, maternal grandmother, and mother; Clotting disorder in her mother; Coronary artery disease in her father; GER disease in her sister; High Cholesterol in her sister; Hyperlipidemia in her father; Hypertension in her father and sister; Leukemia in her maternal aunt; Peripheral vascular disease in her father; Stroke in her father and mother. Social History:  Patient  reports that she has never smoked. She has never used smokeless tobacco. She reports current alcohol use. She reports that  she does not use drugs.  Review of Systems: Constitutional: Negative for fever malaise or anorexia Cardiovascular: negative for chest pain Respiratory: negative for SOB or +cough Gastrointestinal: negative for abdominal pain  OBJECTIVE Vitals: There were no vitals taken for this visit. General: no acute distress , A&Ox3 Coughing, congested. No respiratory distress  Willow Ora, MD

## 2019-01-07 ENCOUNTER — Encounter: Payer: Self-pay | Admitting: Family Medicine

## 2019-01-07 ENCOUNTER — Telehealth: Payer: Self-pay

## 2019-01-07 NOTE — Telephone Encounter (Signed)
Copied from [place] [id number]. Topic: General - Other >> Jan 06, 2019 11:33 AM Rayann Heman wrote: Reason for CRM: pt called and stated that she had an appointment today and was told by provider to schedule a physical and follow up in two weeks. Unable to schedule. Please advise >> Jan 06, 2019 12:19 PM Horton Chin M wrote: Can pt be scheduled in a 20 minute spot? Or where would be the best place to work pt in? Please advise

## 2019-01-07 NOTE — Telephone Encounter (Signed)
Can you please schedule patient for follow up in office visit for 2-4 wks with Dr. Jonni Sanger for her breathing and then schedule cpe for her first available appt.  Thank you!

## 2019-01-07 NOTE — Telephone Encounter (Signed)
SEE NOTE

## 2019-01-17 ENCOUNTER — Encounter: Payer: Self-pay | Admitting: Gastroenterology

## 2019-01-18 ENCOUNTER — Other Ambulatory Visit: Payer: Self-pay | Admitting: Family Medicine

## 2019-01-21 ENCOUNTER — Institutional Professional Consult (permissible substitution): Payer: Managed Care, Other (non HMO) | Admitting: Internal Medicine

## 2019-01-24 ENCOUNTER — Ambulatory Visit (INDEPENDENT_AMBULATORY_CARE_PROVIDER_SITE_OTHER): Payer: Managed Care, Other (non HMO) | Admitting: Family Medicine

## 2019-01-24 ENCOUNTER — Ambulatory Visit (INDEPENDENT_AMBULATORY_CARE_PROVIDER_SITE_OTHER): Payer: Managed Care, Other (non HMO)

## 2019-01-24 ENCOUNTER — Other Ambulatory Visit: Payer: Self-pay

## 2019-01-24 ENCOUNTER — Encounter: Payer: Self-pay | Admitting: Family Medicine

## 2019-01-24 VITALS — BP 120/78 | HR 115 | Temp 98.1°F | Ht 61.0 in | Wt 161.4 lb

## 2019-01-24 DIAGNOSIS — R000 Tachycardia, unspecified: Secondary | ICD-10-CM

## 2019-01-24 DIAGNOSIS — J989 Respiratory disorder, unspecified: Secondary | ICD-10-CM

## 2019-01-24 DIAGNOSIS — J329 Chronic sinusitis, unspecified: Secondary | ICD-10-CM

## 2019-01-24 DIAGNOSIS — R0989 Other specified symptoms and signs involving the circulatory and respiratory systems: Secondary | ICD-10-CM

## 2019-01-24 DIAGNOSIS — R05 Cough: Secondary | ICD-10-CM

## 2019-01-24 DIAGNOSIS — E039 Hypothyroidism, unspecified: Secondary | ICD-10-CM

## 2019-01-24 DIAGNOSIS — R251 Tremor, unspecified: Secondary | ICD-10-CM | POA: Diagnosis not present

## 2019-01-24 DIAGNOSIS — R058 Other specified cough: Secondary | ICD-10-CM

## 2019-01-24 MED ORDER — BENZONATATE 200 MG PO CAPS: 200.0000 mg | ORAL_CAPSULE | Freq: Two times a day (BID) | ORAL | 0 refills | Status: DC | PRN

## 2019-01-24 MED ORDER — SULFAMETHOXAZOLE-TRIMETHOPRIM 800-160 MG PO TABS: 1.0000 | ORAL_TABLET | Freq: Two times a day (BID) | ORAL | 0 refills | Status: DC

## 2019-01-24 MED ORDER — BUDESONIDE-FORMOTEROL FUMARATE 80-4.5 MCG/ACT IN AERO: 2.0000 | INHALATION_SPRAY | Freq: Two times a day (BID) | RESPIRATORY_TRACT | 5 refills | Status: DC

## 2019-01-24 MED ORDER — DOXYCYCLINE HYCLATE 100 MG PO TABS: 100.0000 mg | ORAL_TABLET | Freq: Two times a day (BID) | ORAL | 0 refills | Status: DC

## 2019-01-24 MED ORDER — ALBUTEROL SULFATE HFA 108 (90 BASE) MCG/ACT IN AERS: 2.0000 | INHALATION_SPRAY | Freq: Four times a day (QID) | RESPIRATORY_TRACT | 2 refills | Status: DC | PRN

## 2019-01-24 MED ORDER — MONTELUKAST SODIUM 10 MG PO TABS: 10.0000 mg | ORAL_TABLET | Freq: Every day | ORAL | 3 refills | Status: DC

## 2019-01-24 NOTE — Patient Instructions (Addendum)
Please return for your annual complete physical; please come fasting. Please schedule this today on your way out. Please reschedule your appointment with Dr. Melvyn Novas.   I will release your lab results to you on your MyChart account with further instructions. Please reply with any questions.   Today I have refilled your albuterol and tessalon perles. I'm also adding back singulair and starting a second inhaler called symbicort to take twice a day. I am hoping this will make it so you can use your albuterol inhaler less. You are not wheezing now so hold off on the albuterol. I believe you are using it too much and it is giving you side effects. As well we will treat for a possible persistent sinus infection with doxycycline x 7 days.   If you have any questions or concerns, please don't hesitate to send me a message via MyChart or call the office at [phone]. Thank you for visiting with [place] today! It's our pleasure caring for you.

## 2019-01-24 NOTE — Progress Notes (Signed)
Subjective  CC:  Chief Complaint  Patient presents with  . Bronchitis    cough not improved  . Sinusitis    head and sinus pressure    HPI: Claudia Luna is a 63 y.o. female who presents to the office today to address the problems listed above in the chief complaint.  F/u from earlier this month: treated for sinusitis/bronchitis with zpak and pred. Says felt great while on pred. Since off, cough has resurfaced and sinus pain with pressure is back. She has been using her albuterol inhaler multiple times per day due to "sob". Reviewed old records: cough syndrome managed by pulm- last 2016. Had appt on the 18th but had to cancel. Seeing specialist in [place] for her "nutrition infusion". Denies fevers, malaise, myalgias, loss of taste or smell or exposures to covid 19. Had been on singulair but isn't now. Taking PPI for GERD related cough. Feels tessalon perles help. No dx of asthma or copd documented.    Assessment  1. Upper airway cough syndrome   2. Reactive airway disease that is not asthma   3. Hypothyroidism (acquired)   4. Chronic sinusitis, unspecified location   5. Tachycardia   6. Tremor      Plan   Cough syndrome:  rec f/u with pulm. Check cxr today. Lungs are clear. Back off of albuterol use. Add symbicort for now. Add back singulair. Continue zyrtec, tessalon perles  Possible persistent sinus infection: will retreat with septra.   Recheck thyroid function; reports she is on the lower dose of thyroid now. Tachycardic and tremulous: could be from albuterol as well.   Follow up: Return for complete physical.  Visit date not found  Orders Placed This Encounter  Procedures  . CXR  . TSH  . CMP  . ESR   Meds ordered this encounter  Medications  . montelukast (SINGULAIR) 10 MG tablet    Sig: Take 1 tablet (10 mg total) by mouth at bedtime.    Dispense:  30 tablet    Refill:  3  . benzonatate (TESSALON) 200 MG capsule    Sig: Take 1 capsule (200 mg total)  by mouth 2 (two) times daily as needed for cough.    Dispense:  20 capsule    Refill:  0  . DISCONTD: doxycycline (VIBRA-TABS) 100 MG tablet    Sig: Take 1 tablet (100 mg total) by mouth 2 (two) times daily for 7 days.    Dispense:  14 tablet    Refill:  0  . budesonide-formoterol (SYMBICORT) 80-4.5 MCG/ACT inhaler    Sig: Inhale 2 puffs into the lungs 2 (two) times daily.    Dispense:  1 Inhaler    Refill:  5  . albuterol (VENTOLIN HFA) 108 (90 Base) MCG/ACT inhaler    Sig: Inhale 2 puffs into the lungs every 6 (six) hours as needed for wheezing or shortness of breath.    Dispense:  18 g    Refill:  2  . sulfamethoxazole-trimethoprim (BACTRIM DS) 800-160 MG tablet    Sig: Take 1 tablet by mouth 2 (two) times daily.    Dispense:  14 tablet    Refill:  0      I reviewed the patients updated PMH, FH, and SocHx.    Patient Active Problem List   Diagnosis Date Noted  . Chronic narcotic dependence ([place]) 02/17/2017    Priority: High  . Family history of breast cancer in first degree relative 02/17/2017    Priority:  High  . Fibromyalgia, primary 12/24/2016    Priority: High  . GAD (generalized anxiety disorder) 12/24/2016    Priority: High  . Chronic pain syndrome 12/24/2016    Priority: High  . Mixed hyperlipidemia 01/19/2007    Priority: High  . Hypothyroidism (acquired) 09/01/2006    Priority: High  . OA (osteoarthritis) of knee 07/24/2017    Priority: Medium  . History of herniated intervertebral disc 12/24/2016    Priority: Medium  . Migraine headache without aura 12/06/2015    Priority: Medium  . Upper airway cough syndrome 04/13/2012    Priority: Medium  . Reactive airway disease that is not asthma 12/29/2007    Priority: Medium  . 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    Priority: Medium  . Essential tremor 02/17/2017    Priority: Low  . Vertigo 12/24/2016    Priority: Low  . 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 History of total knee replacement, left  03/08/2018   Current Meds  Medication Sig  . albuterol (VENTOLIN HFA) 108 (90 Base) MCG/ACT inhaler Inhale 2 puffs into the lungs every 6 (six) hours as needed for wheezing or shortness of breath.  . benzonatate (TESSALON) 200 MG capsule Take 1 capsule (200 mg total) by mouth 2 (two) times daily as needed for cough.  . cetirizine (ZYRTEC) 10 MG tablet Take 10 mg by mouth daily as needed for allergies.  Marland Kitchen gabapentin (NEURONTIN) 300 MG capsule Take 1 capsule (300 mg total) by mouth 3 (three) times daily.  Marland Kitchen levothyroxine (SYNTHROID) 88 MCG tablet TAKE 1 TABLET (88 MCG TOTAL) BY MOUTH DAILY BEFORE BREAKFAST.  Marland Kitchen meclizine (ANTIVERT) 25 MG tablet Take 1 tablet (25 mg total) by mouth 3 (three) times daily as needed for dizziness.  . methocarbamol (ROBAXIN) 500 MG tablet Take 500 mg by mouth at bedtime as needed for muscle spasms.  Marland Kitchen omeprazole (PRILOSEC) 20 MG capsule Take 1 capsule (20 mg total) by mouth daily.  . ondansetron (ZOFRAN) 4 MG tablet Take 1 tablet (4 mg total) by mouth every 8 (eight) hours as needed for nausea or vomiting.  Marland Kitchen PARoxetine (PAXIL) 20 MG tablet Take 1 tablet (20 mg total) by mouth at bedtime.  Marland Kitchen PRESCRIPTION MEDICATION Take 1 capsule by mouth daily. T3 Liothyronine 20 mcg  . progesterone (PROMETRIUM) 200 MG capsule Take 200 mg by mouth daily.  . rizatriptan (MAXALT) 10 MG tablet TAKE 1 TABLET BY MOUTH DAILY AS NEEDED FOR HEADACHE  . rosuvastatin (CRESTOR) 10 MG tablet Take 1 tablet (10 mg total) by mouth at bedtime.  . tapentadol (NUCYNTA) 50 MG tablet Take 50 mg by mouth every 4 (four) hours as needed.  . valACYclovir (VALTREX) 1000 MG tablet Take 2 tabs at onset of cold sore and repeat in 12 hours  . Vitamin D, Ergocalciferol, (DRISDOL) 1.25 MG (50000 UT) CAPS capsule TAKE 1 CAPSULE BY MOUTH 2 TIMES A WEEK  . [DISCONTINUED] albuterol (VENTOLIN HFA) 108 (90 Base) MCG/ACT inhaler Inhale 2 puffs into the lungs every 4 (four) hours as needed for wheezing or shortness of  breath.  . [DISCONTINUED] benzonatate (TESSALON) 200 MG capsule Take 1 capsule (200 mg total) by mouth 2 (two) times daily as needed for cough.    Allergies: Patient is allergic to codeine; pregabalin; and penicillins. Family History: Patient family history includes Breast cancer in her maternal aunt, maternal grandmother, and mother; Clotting disorder in her mother; Coronary artery disease in her father; GER disease in her sister; High Cholesterol  in her sister; Hyperlipidemia in her father; Hypertension in her father and sister; Leukemia in her maternal aunt; Peripheral vascular disease in her father; Stroke in her father and mother. Social History:  Patient  reports that she has never smoked. She has never used smokeless tobacco. She reports current alcohol use. She reports that she does not use drugs.  Review of Systems: Constitutional: Negative for fever malaise or anorexia Cardiovascular: negative for chest pain Respiratory: negative for SOB or persistent cough Gastrointestinal: negative for abdominal pain  Objective  Vitals: BP 120/78 (BP Location: Right Arm, Patient Position: Sitting, Cuff Size: Normal)   Pulse (!) 115   Temp 98.1 F (36.7 C) (Temporal)   Ht 5' 1" (1.549 m)   Wt 161 lb 6.4 oz (73.2 kg)   SpO2 95%   BMI 30.50 kg/m  General: coughing but no respiratory distress , A&Ox3 HEENT: [place], conjunctiva normal, + sinus ttp bilaterally, no lymphadenopathy, neck is supple Cardiovascular:  tachy Respiratory:  Good breath sounds bilaterally, CTAB with normal respiratory effort, no wheezing or rales Skin:  Warm, no rashes Neuro: + tremor     Commons side effects, risks, benefits, and alternatives for medications and treatment plan prescribed today were discussed, and the patient expressed understanding of the given instructions. Patient is instructed to call or message via MyChart if he/she has any questions or concerns regarding our treatment plan. No barriers to  understanding were identified. We discussed Red Flag symptoms and signs in detail. Patient expressed understanding regarding what to do in case of urgent or emergency type symptoms.   Medication list was reconciled, printed and provided to the patient in AVS. Patient instructions and summary information was reviewed with the patient as documented in the AVS. This note was prepared with assistance of Dragon voice recognition software. Occasional wrong-word or sound-a-like substitutions may have occurred due to the inherent limitations of voice recognition software  This visit occurred during the SARS-CoV-2 public health emergency.  Safety protocols were in place, including screening questions prior to the visit, additional usage of staff PPE, and extensive cleaning of exam room while observing appropriate contact time as indicated for disinfecting solutions.

## 2019-01-25 LAB — COMPREHENSIVE METABOLIC PANEL
ALT: 22 U/L (ref 0–35)
AST: 21 U/L (ref 0–37)
Albumin: 4.5 g/dL (ref 3.5–5.2)
Alkaline Phosphatase: 71 U/L (ref 39–117)
BUN: 9 mg/dL (ref 6–23)
CO2: 22 mEq/L (ref 19–32)
Calcium: 9.9 mg/dL (ref 8.4–10.5)
Chloride: 108 mEq/L (ref 96–112)
Creatinine, Ser: 0.8 mg/dL (ref 0.40–1.20)
GFR: 72.68 mL/min (ref >60.00)
Glucose, Bld: 135 mg/dL — ABNORMAL HIGH (ref 70–99)
Potassium: 3.8 mEq/L (ref 3.5–5.1)
Sodium: 140 mEq/L (ref 135–145)
Total Bilirubin: 0.3 mg/dL (ref 0.2–1.2)
Total Protein: 7.2 g/dL (ref 6.0–8.3)

## 2019-01-25 LAB — SEDIMENTATION RATE: Sed Rate: 29 mm/hr (ref 0–30)

## 2019-01-25 LAB — TSH: TSH: 0.03 u[IU]/mL — ABNORMAL LOW (ref 0.35–4.50)

## 2019-01-26 ENCOUNTER — Other Ambulatory Visit: Payer: Self-pay

## 2019-01-26 DIAGNOSIS — E039 Hypothyroidism, unspecified: Secondary | ICD-10-CM

## 2019-02-02 ENCOUNTER — Other Ambulatory Visit: Payer: Self-pay | Admitting: Family Medicine

## 2019-02-02 ENCOUNTER — Encounter: Payer: Self-pay | Admitting: Family Medicine

## 2019-02-08 ENCOUNTER — Other Ambulatory Visit: Payer: Managed Care, Other (non HMO)

## 2019-02-10 ENCOUNTER — Other Ambulatory Visit: Payer: Self-pay

## 2019-02-10 ENCOUNTER — Ambulatory Visit: Payer: Managed Care, Other (non HMO) | Admitting: Internal Medicine

## 2019-02-10 ENCOUNTER — Encounter: Payer: Self-pay | Admitting: Internal Medicine

## 2019-02-10 DIAGNOSIS — R05 Cough: Secondary | ICD-10-CM

## 2019-02-10 DIAGNOSIS — R0989 Other specified symptoms and signs involving the circulatory and respiratory systems: Secondary | ICD-10-CM | POA: Diagnosis not present

## 2019-02-10 DIAGNOSIS — J989 Respiratory disorder, unspecified: Secondary | ICD-10-CM

## 2019-02-10 DIAGNOSIS — R058 Other specified cough: Secondary | ICD-10-CM

## 2019-02-10 MED ORDER — BENZONATATE 200 MG PO CAPS: 200.0000 mg | ORAL_CAPSULE | Freq: Three times a day (TID) | ORAL | 2 refills | Status: DC | PRN

## 2019-02-10 MED ORDER — PREDNISONE 10 MG PO TABS: ORAL_TABLET | ORAL | 0 refills | Status: DC

## 2019-02-10 NOTE — Progress Notes (Signed)
Subjective:     Patient ID: Claudia Luna, female   DOB: Jun 06, 1955   MRN: [id number]   Brief patient profile:  30 yowf never smoker perfectly healthy except for  dx of fibromyalgia in 1991 referred by Claudia Luna to pulmonary clinic 12/02/12 for refractory cough since 10/2011 with methacholine challenge 01/06/13 causing cough but only drop in fef 2575   History of Present Illness  12/02/2012 1st  Pulmonary office visit/ Claudia Luna cc acute onset September 2013  facial pain, sore throat assoc with cough yellow mucus sorta like a head cold that persisted and some better with abx and cough turned more dry but persisted ever since. Only sob when coughing. No cough to vomit. Cough to point where sore in upper abd during cough and also midline upper chest Cough is daily wakes her up and decreases some p asleep.  rec  First take delsym two tsp every 12 hours and supplement if needed with oxy ir 5 mg  up to 2 every 4 hours to suppress the urge to cough at all or even clear your throat.  .   Pantoprazole (protonix) 40 mg   Take 30-60 min before first meal of the day and Pepcid 20 mg one bedtime until return to office    GERD  Diet   Prednisone 10 mg take  4 each am x 2 days,   2 each am x 2 days,  1 each am x 2 days and stop     12/17/2012 f/u ov/Claudia Luna re: cough x one year Chief Complaint  Patient presents with  . Acute Visit    Pt states that cough had improved while on prednisone, but has worsened since she stopped med 2 days ago.  Cough is non prod, but she feels congested in her chest.     sensation of globus, "not able to cough the fur ball up"  >>Neurontin Three times a day , cough control regimen w/ oxycodone GERD rx     12/28/12 Follow up  Returns for follow up and Med calendar .  reports cough is improved since last ov, congestion is resolved.  pred taper was extended on 11/20. Cough is better while she is using oxycodone. We discussed cough suppression regimen . Also discussed short  term use of narcotic for cough control and dangers of narcotic dependence. She verbalizes awareness.  Discussed GERD/AR /cough prevention regiemn.  Cough is mainly dry. No discolored mucus, fever, chest pain, orthopnea or edema.  rec Follow med calendar closely and bring to each visit.  We are setting you up for a Methacholine Challenge Test > coughed but only dropped in fef 25-75 ? Better p albuterol  Work on cough control regimen    01/11/2013 f/u ov/Claudia Luna re: chronic dry cough x one year Chief Complaint  Patient presents with  . 2 week follow up    Cough has improved since last OV.  still needs oxyir  at least twice daily in am w/in 30 min of rising and the 3 pm, ? Better p saba but not clear  rec Be sure you are taking the pepcid 20 mg in the evening  Increase gabapentin to 300 mg three times daily Tessilon 200 mg four times daily as per calendar Reduce vivelle Dot to one half twice  weekly Add singulair 10 mg one daily     02/10/2019  Re -Consult /Claudia Luna  Cough x 2013  On 300 mg bid gabapentin  Chief Complaint  Patient presents with  .  Pulmonary Consult    Referred by Claudia. Mardelle Luna for eval of cough since Sept 2020.   Dyspnea: steps only, some bike riding ok  Cough: after stirs in am starts coughing but does not wake her up / worse since sept 2020 / minimal mucoid production Sleeping: 2 pillows flat bed  SABA use: twice daily seems to help despite symbicort 80 2bid  02: none     No obvious other patterns in  day to day or daytime variability or assoc excess/ purulent sputum or mucus plugs or hemoptysis or cp or chest tightness, subjective wheeze or overt sinus or hb symptoms.   Sleeping  without nocturnal  or early am exacerbation  of respiratory  c/o's or need for noct saba. Also denies any obvious fluctuation of symptoms with weather or environmental changes or other aggravating or alleviating factors except as outlined above   No unusual exposure hx or h/o childhood pna/ asthma  or knowledge of premature birth.  Current Allergies, Complete Past Medical History, Past Surgical History, Family History, and Social History were reviewed in [employer] record.  ROS  The following are not active complaints unless bolded Hoarseness, sore throat, dysphagia, dental problems, itching, sneezing,  nasal congestion or discharge of excess mucus or purulent secretions, ear ache,   fever, chills, sweats, unintended wt loss or wt gain, classically pleuritic or exertional cp,  orthopnea pnd or arm/hand swelling  or leg swelling, presyncope, palpitations, abdominal pain, anorexia, nausea, vomiting, diarrhea  or change in bowel habits or change in bladder habits, change in stools or change in urine, dysuria, hematuria,  rash, arthralgias, visual complaints, headache, numbness, weakness or ataxia or problems with walking or coordination,  change in mood or  memory.        Current Meds  Medication Sig  . albuterol (VENTOLIN HFA) 108 (90 Base) MCG/ACT inhaler Inhale 2 puffs into the lungs every 6 (six) hours as needed for wheezing or shortness of breath.  . Armodafinil (NUVIGIL) 50 MG tablet Take 50 mg by mouth See admin instructions. Take 50 mg in the morning and may take a second 50 mg dose as needed for alertness  . Ascorbic Acid (VITAMIN C) 1000 MG tablet Take 1,000 mg by mouth 2 (two) times daily.  . benzonatate (TESSALON) 200 MG capsule TAKE 1 CAPSULE BY MOUTH TWICE A DAY AS NEEDED FOR COUGH  . BIOTIN PO Take 1 Can by mouth daily.  . budesonide-formoterol (SYMBICORT) 80-4.5 MCG/ACT inhaler Inhale 2 puffs into the lungs 2 (two) times daily.  . cetirizine (ZYRTEC) 10 MG tablet Take 10 mg by mouth daily as needed for allergies.  . Cyanocobalamin (B-12 PO) Take 1 tablet by mouth daily.  . diphenhydrAMINE (BENADRYL ALLERGY) 25 MG tablet Take 25 mg by mouth every 6 (six) hours as needed.  . docusate sodium (COLACE) 100 MG capsule Take 100 mg by mouth 2 (two) times daily.   Marland Kitchen gabapentin (NEURONTIN) 300 MG capsule Take 1 capsule (300 mg total) by mouth 3 (three) times daily.  Marland Kitchen levothyroxine (SYNTHROID) 88 MCG tablet TAKE 1 TABLET (88 MCG TOTAL) BY MOUTH DAILY BEFORE BREAKFAST.  Marland Kitchen meclizine (ANTIVERT) 25 MG tablet Take 1 tablet (25 mg total) by mouth 3 (three) times daily as needed for dizziness.  . methocarbamol (ROBAXIN) 500 MG tablet Take 500 mg by mouth at bedtime as needed for muscle spasms.  . montelukast (SINGULAIR) 10 MG tablet Take 1 tablet (10 mg total) by mouth at bedtime.  Marland Kitchen omeprazole (  PRILOSEC) 20 MG capsule Take 1 capsule (20 mg total) by mouth daily.  . ondansetron (ZOFRAN) 4 MG tablet Take 1 tablet (4 mg total) by mouth every 8 (eight) hours as needed for nausea or vomiting.  Marland Kitchen PARoxetine (PAXIL) 20 MG tablet Take 1 tablet (20 mg total) by mouth at bedtime.  Marland Kitchen PRESCRIPTION MEDICATION Take 1 capsule by mouth daily. T3 Liothyronine 20 mcg  . progesterone (PROMETRIUM) 200 MG capsule Take 200 mg by mouth daily.  . rizatriptan (MAXALT) 10 MG tablet TAKE 1 TABLET BY MOUTH DAILY AS NEEDED FOR HEADACHE  . rosuvastatin (CRESTOR) 10 MG tablet Take 1 tablet (10 mg total) by mouth at bedtime.  . sulfamethoxazole-trimethoprim (BACTRIM DS) 800-160 MG tablet Take 1 tablet by mouth 2 (two) times daily.  . tapentadol (NUCYNTA) 50 MG tablet Take 50 mg by mouth every 4 (four) hours as needed.  Marland Kitchen UNABLE TO FIND Med Name: L Glutaliothone twice daily  . UNABLE TO FIND Med Name: immunostim twice daily  . UNABLE TO FIND Med Name:adrenal reapair twice daily  . UNABLE TO FIND Med Name: "T3" daily  . UNABLE TO FIND Med Name: juice plus  . UNABLE TO FIND Med Name: Ultrabiotic 100 mg daily  . valACYclovir (VALTREX) 1000 MG tablet Take 2 tabs at onset of cold sore and repeat in 12 hours  . Vitamin D, Ergocalciferol, (DRISDOL) 1.25 MG (50000 UT) CAPS capsule TAKE 1 CAPSULE BY MOUTH 2 TIMES A WEEK                   Objective:   Physical Exam   02/10/2019         162    01/11/13 144 lb (65.318 kg)  12/28/12 140 lb 3.2 oz (63.594 kg)  12/23/12 140 lb (63.504 kg)     amb wf with shaky voice/ hoarseness    Vital signs reviewed - Note on arrival 02 sats  97% on RA     HEENT : pt wearing mask not removed for exam due to covid -19 concerns.    NECK :  without JVD/Nodes/TM/ nl carotid upstrokes bilaterally   LUNGS: no acc muscle use,  Nl contour chest which is clear to A and P bilaterally with cough early  on insp p maneuvers   CV:  RRR  no s3 or murmur or increase in P2, and no edema   ABD:  soft and nontender with nl inspiratory excursion in the supine position. No bruits or organomegaly appreciated, bowel sounds nl  MS:  Nl gait/ ext warm without deformities, calf tenderness, cyanosis or clubbing No obvious joint restrictions   SKIN: warm and dry without lesions    NEURO:  alert, approp, nl sensorium with  no motor or cerebellar deficits apparent.        I personally reviewed images and agree with radiology impression as follows:  CXR:   01/24/2019 No radiographic evidence of acute cardiopulmonary disease.    Assessment:

## 2019-02-10 NOTE — Patient Instructions (Addendum)
Ask your fibromyalgia doctor if you can come off the progesterone or at least use a lower dose   Stop benadryl (diphenydramine) and if still sleepy try off the zyrtec too  Prednisone 10 mg take  4 each am x 2 days,   2 each am x 2 days,  1 each am x 2 days and stop   Prilosec 20 mg Take 30- 60 min before your first and last meals of the day   Tessilon 200 mg every 6 hours as needed  GERD (REFLUX)  is an extremely common cause of respiratory symptoms just like yours , many times with no obvious heartburn at all.    It can be treated with medication, but also with lifestyle changes including elevation of the head of your bed (ideally with 6 -8inch blocks under the headboard of your bed),  Smoking cessation, avoidance of late meals, excessive alcohol, and avoid fatty foods, chocolate, peppermint, colas, red wine, and acidic juices such as orange juice.  NO MINT OR MENTHOL PRODUCTS SO NO COUGH DROPS  USE SUGARLESS CANDY INSTEAD (Jolley ranchers or Stover's or Life Savers) or even ice chips will also do - the key is to swallow to prevent all throat clearing. NO OIL BASED VITAMINS - use powdered substitutes.  Avoid fish oil when coughing.  Gabapentin 300 mg bfast, supper, bedtime for a couple of weeks then up to 4 x daily    Only use your albuterol as a rescue medication to be used if you can't catch your breath by resting or doing a relaxed purse lip breathing pattern.  - The less you use it, the better it will work when you need it. - Ok to use up to 2 puffs  every 4 hours if you must but call for immediate appointment if use goes up over your usual need - Don't leave home without it !!  (think of it like the spare tire for your car)   See Tammy NP w/in 4 weeks with all your medications, even over the counter meds, separated in two separate bags, the ones you take no matter what vs the ones you stop once you feel better and take only as needed when you feel you need them.   Tammy  will generate  for you a new user friendly medication calendar that will put [place] all on the same page re: your medication use.     Without this process, it simply isn't possible to assure that we are providing  your outpatient care  with  the attention to detail we feel you deserve.   If we cannot assure that you're getting that kind of care,  then we cannot manage your problem effectively from this clinic.  Once you have seen Tammy and we are sure that we're all on the same page with your medication use she will arrange follow up with me.

## 2019-02-11 ENCOUNTER — Encounter: Payer: Self-pay | Admitting: Internal Medicine

## 2019-02-11 NOTE — Assessment & Plan Note (Signed)
See methacholine 01/06/13 > caused cough and drop in fef25-75 > trial of singulair rec 01/11/13  -02/10/2019  After extensive coaching inhaler device,  effectiveness =    75% > continue symb 80 2bid but reduce saba to prn    Also added 6 days of Prednisone in case of component of Th-2 driven upper or lower airways inflammation (if cough responds short term only to relapse befor return while will on rx for uacs that would point to allergic rhinitis/ asthma or eos bronchitis)

## 2019-02-11 NOTE — Assessment & Plan Note (Addendum)
Onset 10/2011 - Sinus CT 06/04/12 Minimal maxillary mucoperiosteal thickening. -methacholine challenge 01/06/13 causing cough but only drop in fef 2575  - neurontin rx 12/18/2012 > increased to 300 tid 01/11/13 plus added singulair - med calendar 12/28/12 , 02/18/13   Never completed resolved now worse since Sept 2020.  Of the three most common causes of  Sub-acute / recurrent or chronic cough, only one (GERD, which she is more prone to as she ages, gains wt, takes progestone or clears her throat)  can actually contribute to/ trigger  the other two (asthma and post nasal drip syndrome)  and perpetuate the cylce of cough.  While not intuitively obvious, many patients with chronic low grade reflux do not cough until there is a primary insult that disturbs the protective epithelial barrier and exposes sensitive nerve endings.   This is typically viral but can due to PNDS and  either may apply here.  >>>    The point is that once this occurs, it is difficult to eliminate the cycle  using anything but a maximally effective acid suppression regimen at least in the short run, accompanied by an appropriate diet to address non acid GERD and also added 6 days of Prednisone in case of component of Th-2 driven upper or lower airways inflammation (if cough responds short term only to relapse befor return while will on rx for uacs that would point to allergic rhinitis/ asthma or eos bronchitis)   Next step is return with all meds in hand using a trust but verify approach to confirm accurate Medication  Reconciliation The principal here is that until we are certain that the  patients are doing what we've asked, it makes no sense to ask them to do more.    To keep things simple, I have asked the patient to first separate medicines that are perceived as maintenance, that is to be taken daily "no matter what", from those medicines that are taken on only on an as-needed basis and I have given the patient examples of both,  and then return to see our NP to generate a  detailed  medication calendar which should be followed until the next physician sees the patient and updates it.      Total time devoted to counseling  > 50 % of initial 60 min office visit:  reviewed case with pt/ performed device teaching  using a teach back technique which also  extended face to face time for this visit (see above) discussion of options/alternatives/ personally creating written customized instructions  in presence of pt  then going over those specific  Instructions directly with the pt including how to use all of the meds but in particular covering each new medication in detail and the difference between the maintenance= "automatic" meds and the prns using an action plan format for the latter (If this problem/symptom => do that organization reading Left to right).  Please see AVS from this visit for a full list of these instructions which I personally wrote for this pt and  are unique to this visit.

## 2019-02-23 ENCOUNTER — Telehealth: Payer: Self-pay | Admitting: Internal Medicine

## 2019-02-23 NOTE — Telephone Encounter (Signed)
Dr. Wynona Neat,  This has been forwarded to you for review.

## 2019-02-24 ENCOUNTER — Encounter: Payer: Self-pay | Admitting: Adult Health

## 2019-02-24 ENCOUNTER — Ambulatory Visit (INDEPENDENT_AMBULATORY_CARE_PROVIDER_SITE_OTHER): Payer: Managed Care, Other (non HMO) | Admitting: Adult Health

## 2019-02-24 ENCOUNTER — Other Ambulatory Visit: Payer: Self-pay

## 2019-02-24 ENCOUNTER — Telehealth: Payer: Self-pay | Admitting: General Surgery

## 2019-02-24 DIAGNOSIS — R05 Cough: Secondary | ICD-10-CM

## 2019-02-24 DIAGNOSIS — R059 Cough, unspecified: Secondary | ICD-10-CM

## 2019-02-24 NOTE — Telephone Encounter (Signed)
Symptom monitoring is appropriate  If fevers/ chills then an antibiotic may be considered.  Follow up with Dr Sherene Sires as scheduled

## 2019-02-24 NOTE — Patient Instructions (Addendum)
Mucinex Twice daily  As needed  Cough /congestion  May start Doxycycline as directed.  Delsym 2 tsp Twice daily  As needed  Cough  Saline nasal rinses As needed   Tessalon Three times a day  As needed  Cough  Voice rest , sips of water to avoid throat clearing and coughing .  COVID 19 test.  Follow up in 2 weeks and As needed   Please contact office for sooner follow up if symptoms do not improve or worsen or seek emergency care

## 2019-02-24 NOTE — Telephone Encounter (Signed)
lmtcb for pt.  

## 2019-02-24 NOTE — Progress Notes (Signed)
Virtual Visit via Telephone Note  I connected with Claudia Luna on 02/24/19 at  4:30 PM EST by telephone and verified that I am speaking with the correct person using two identifiers.  Location: Patient: Home  Provider: Office    I discussed the limitations, risks, security and privacy concerns of performing an evaluation and management service by telephone and the availability of in person appointments. I also discussed with the patient that there may be a patient responsible charge related to this service. The patient expressed understanding and agreed to proceed.   History of Present Illness: 64 yo female never smoker followed for recurrent cough.  Today's televisit is for an acute office visit . Seen on 02/10/19 with flare of cough for 4 months. Was given Prednisone taper and cough suppression regimen . Was doing better with cough but got choked on apple juice .complains of sinus congestion and pressure on left maxillary sinus. Mild upper teeth pain . Had low grade fever few days ago. Denies loss of taste or smell. No known sick contacts .  Has course of Doxycycline course at home she has not started.    Observations/Objective: Speaks in full sentences , no apparent distress.   Assessment and Plan: Upper airway cough syndrome  -cough control regimen  -if not improving , consider CT sinus .   Acute sinusitis  -check covid 19 test  -complete doxycycline course   Plan  Patient Instructions  Mucinex Twice daily  As needed  Cough /congestion  May start Doxycycline as directed.  Delsym 2 tsp Twice daily  As needed  Cough  Saline nasal rinses As needed   Tessalon Three times a day  As needed  Cough  Voice rest , sips of water to avoid throat clearing and coughing .  COVID 19 test.  Follow up in 2 weeks and As needed   Please contact office for sooner follow up if symptoms do not improve or worsen or seek emergency care        Follow Up Instructions: Follow up in 2  weeks and As needed   Please contact office for sooner follow up if symptoms do not improve or worsen or seek emergency care     I discussed the assessment and treatment plan with the patient. The patient was provided an opportunity to ask questions and all were answered. The patient agreed with the plan and demonstrated an understanding of the instructions.   The patient was advised to call back or seek an in-person evaluation if the symptoms worsen or if the condition fails to improve as anticipated.  I provided 22  minutes of non-face-to-face time during this encounter.   Rubye Oaks, NP

## 2019-02-24 NOTE — Telephone Encounter (Signed)
Called the patient after her televisit with Rubye Oaks, NP and gave her the contact information needed for covid testing. Advised the patient the order was already placed. Patient voiced understanding. Nothing further needed at this time.

## 2019-02-24 NOTE — Telephone Encounter (Signed)
Message sent to App of the day, Rubye Oaks, NP  I called the patient back to advise of Dr. Trena Platt recommendation, the patient stated that she aspirated a large amount of apple cider on 02/13/19. She was not able to catch her breath and thought she would have to be rushed to the hospital.  Patient feels the progress she made with the cough was completely reversed after the aspiration.  She has been using her inhaler, tessalon, gabapentin 300 mg without any success. Continues to have left sided sinus pain that does not resolve even when she uses maxalt for her headaches, which will not go away since 02/13/19 either. When she blows her nose the congestion is clear. She has coughed hard enough that she busted blood vessels in her eye.  The patient was added as a televisit because of how she sounded during the call. She was able to get out maybe 10 - 20 words in between coughs.

## 2019-02-24 NOTE — Telephone Encounter (Signed)
Pt is returning call CB# [phone]

## 2019-02-25 ENCOUNTER — Encounter: Payer: Managed Care, Other (non HMO) | Admitting: Gastroenterology

## 2019-03-11 ENCOUNTER — Encounter: Payer: Self-pay | Admitting: Adult Health

## 2019-03-11 ENCOUNTER — Ambulatory Visit: Payer: Managed Care, Other (non HMO) | Admitting: Adult Health

## 2019-03-11 ENCOUNTER — Other Ambulatory Visit: Payer: Self-pay

## 2019-03-11 VITALS — BP 122/68 | HR 99 | Temp 97.3°F | Ht 61.0 in | Wt 165.0 lb

## 2019-03-11 DIAGNOSIS — J310 Chronic rhinitis: Secondary | ICD-10-CM | POA: Diagnosis not present

## 2019-03-11 DIAGNOSIS — R05 Cough: Secondary | ICD-10-CM | POA: Diagnosis not present

## 2019-03-11 DIAGNOSIS — R0989 Other specified symptoms and signs involving the circulatory and respiratory systems: Secondary | ICD-10-CM | POA: Diagnosis not present

## 2019-03-11 DIAGNOSIS — R1310 Dysphagia, unspecified: Secondary | ICD-10-CM | POA: Diagnosis not present

## 2019-03-11 DIAGNOSIS — R058 Other specified cough: Secondary | ICD-10-CM

## 2019-03-11 MED ORDER — OMEPRAZOLE 20 MG PO CPDR: 20.0000 mg | DELAYED_RELEASE_CAPSULE | Freq: Every day | ORAL | 3 refills | Status: DC

## 2019-03-11 NOTE — Assessment & Plan Note (Signed)
Chronic cough-continue on aggressive regimen on trigger prevention cough control Referral to ENT for chronic rhinitis  Referral to GI for dysphagia/choking  Patient's medications were reviewed today and patient education was given. Computerized medication calendar was adjusted/completed   Plan  Patient Instructions  Refer to ENT .  Refer to GI for dysphagia and choking . Mucinex Twice daily  As needed  Cough /congestion  Change omeprazole 20 mg before meal Begin Pepcid 20 mg at bedtime Continue on Delsym 2 tsp Twice daily  As needed  Cough  Saline nasal rinses and saline nasal gel As needed   Tessalon Three times a day  As needed  Cough  Voice rest , sips of water to avoid throat clearing and coughing .  Follow medication calendar closely and bring to each visit .  Follow up with Dr. Sherene Sires  In 4-6 weeks and As needed   Please contact office for sooner follow up if symptoms do not improve or worsen or seek emergency care

## 2019-03-11 NOTE — Assessment & Plan Note (Signed)
Referral to ENT for ongoing chronic sinus issues

## 2019-03-11 NOTE — Assessment & Plan Note (Signed)
Frequent episodes of dysphagia and choking.  Refer to GI

## 2019-03-11 NOTE — Progress Notes (Signed)
@Patient  ID: , female    DOB: [birth year], 64 y.o.   MRN: [id number]  Chief Complaint  Patient presents with   Follow-up    Cough    Referring provider: [id number], MD  HPI: 64 year old female never smoker followed for chronic cough-cough is been for refractory since 2013 Medical history significant for fibromyalgia, chronic pain   TEST/EVENTS :  Sinus CT 06/04/12 Minimal maxillary mucoperiosteal thickening. -methacholine challenge 01/06/13 causing cough but only drop in fef 2575  - neurontin rx 12/18/2012 > increased to 300 tid 01/11/13 plus added singulair - med calendar 12/28/12 , 02/18/13 , 03/11/19   03/11/2019 Follow up : Cough  Patient returns for a 1 month follow-up and medication review.  Last visit patient had reestablish for ongoing cough.  She was given a prednisone taper.  Recommend cough suppression regimen with Tessalon and Delsym.  Treated for GERD with Prilosec. Patient says since last visit she is feeling better.  Her sinus congestion is improved and feels her breathing is some better.  She has had decreased albuterol use.  She says her cough really has not changed she has a constant dry cough.  Patient says she continues to have nasal stuffiness and feels that her nose is always stuffed up and is hard to breathe through.  She does have chronic postnasal drainage.  She says she would like to be referred to ENT to have this evaluated.  Patient also says she has ongoing intermittent dysphagia and gets strangled very easily and is easy to choke. She feels that Symbicort has really helped her breathing.  Patient has brought all of her medications in today for review.  Patient is on multiple medications along with significant use of vitamins and supplements.  Patient education was given.  A medication count was completed.   Allergies  Allergen Reactions   Codeine Nausea And Vomiting   Pregabalin Other (See Comments)    Edema, weight gain    Penicillins Rash    DID THE REACTION INVOLVE: Swelling of the face/tongue/throat, SOB, or low BP? No Sudden or severe rash/hives, skin peeling, or the inside of the mouth or nose? Unknown Did it require medical treatment? Unknown When did it last happen?childhood allergy  If all above answers are NO, may proceed with cephalosporin use.      Immunization History  Administered Date(s) Administered   DTaP 11/05/2008   Hep A / Hep B 03/31/2010, 09/23/2010, 10/21/2010   Influenza Whole 02/02/2004, 12/02/2006, 12/28/2007   Influenza,inj,Quad PF,6+ Mos 12/02/2012, 01/31/2015, 11/08/2015, 12/24/2016, 01/11/2018, 10/29/2018   Influenza,inj,quad, With Preservative 02/03/2017   Influenza-Unspecified 11/30/2013   Pneumococcal Polysaccharide-23 12/28/2007   Zoster 10/27/2011    Past Medical History:  Diagnosis Date   Anxiety    Chronic kidney disease    kidney stones, history of   Depression    Essential tremor 02/17/2017   Family history of breast cancer in first degree relative 02/17/2017   Mother, maternal aunt and grandmother   Fibromyalgia    Fibromyalgia, primary 12/24/2016   Dxd in 1992; with chronic fatigue.    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 Uses zofran and meclizine as needed    Tobacco History: Social History   Tobacco Use  Smoking Status Never Smoker  Smokeless Tobacco Never Used   Counseling given: Not Answered   Outpatient  Medications Prior to Visit  Medication Sig Dispense Refill   albuterol (VENTOLIN HFA) 108 (90 Base) MCG/ACT inhaler Inhale 2 puffs into the lungs every 6 (six) hours as needed for wheezing or shortness of breath. 18 g 2   Ascorbic Acid (VITAMIN C WITH ROSE HIPS) 1000 MG tablet Take 1,000 mg by mouth 2 (two) times daily.     Ascorbic Acid (VITAMIN C) 1000 MG tablet Take 1,000 mg by mouth 2 (two) times daily.     benzonatate (TESSALON) 200 MG capsule Take 1 capsule (200 mg total) by mouth 3 (three) times daily as needed for cough. 45 capsule 2   BIOTIN PO Take 1 Can by mouth daily.     budesonide-formoterol (SYMBICORT) 80-4.5 MCG/ACT inhaler Inhale 2 puffs into the lungs 2 (two) times daily. 1 Inhaler 5   co-enzyme Q-10 30 MG capsule Take 100 mg by mouth daily. 3 tablets for dose of 300 mg     Coenzyme Q10 (CO Q 10 PO) Take 1 capsule by mouth daily.     Cyanocobalamin (B-12 PO) Take 1 tablet by mouth daily.     docusate sodium (COLACE) 100 MG capsule Take 100 mg by mouth 2 (two) times daily.     gabapentin (NEURONTIN) 300 MG capsule Take 1 capsule (300 mg total) by mouth 3 (three) times daily. (Patient taking differently: Take 300 mg by mouth 2 (two) times daily. ) 84 capsule 0   levothyroxine (SYNTHROID) 88 MCG tablet TAKE 1 TABLET (88 MCG TOTAL) BY MOUTH DAILY BEFORE BREAKFAST. 30 tablet 2   meclizine (ANTIVERT) 25 MG tablet Take 1 tablet (25 mg total) by mouth 3 (three) times daily as needed for dizziness. 30 tablet 2   montelukast (SINGULAIR) 10 MG tablet Take 1 tablet (10 mg total) by mouth at bedtime. 30 tablet 3   Multiple Minerals (CHELATED MULTIPLE MINERAL PO) Take 1 tablet by mouth daily.     Multiple Minerals (CHELATED MULTIPLE MINERAL PO) Take 2 tablets by mouth.     ondansetron (ZOFRAN) 4 MG tablet Take 1 tablet (4 mg total) by mouth every 8 (eight) hours as needed for nausea or vomiting. 20 tablet 0   PARoxetine (PAXIL) 20 MG tablet Take 1 tablet (20 mg total) by  mouth at bedtime. 90 tablet 3   Pregnenolone 50 MG TABS Take 100 mg by mouth.     Pregnenolone Micronized (PREGNENOLONE PO) Take 1 tablet by mouth daily.     PRESCRIPTION MEDICATION Take 1 capsule by mouth daily. T3 Liothyronine 20 mcg     progesterone (PROMETRIUM) 200 MG capsule Take 200 mg by mouth daily.     rizatriptan (MAXALT) 10 MG tablet TAKE 1 TABLET BY MOUTH DAILY AS NEEDED FOR HEADACHE 10 tablet 6   rosuvastatin (CRESTOR) 10 MG tablet Take 1 tablet (10 mg total) by mouth at bedtime. 90 tablet 3   tapentadol (NUCYNTA) 50 MG tablet Take 50 mg by mouth every 4 (four) hours as needed.     UNABLE TO FIND Med Name: L  Glutaliothone twice daily     UNABLE TO FIND Med Name: immunostim twice daily     UNABLE TO FIND Med Name:adrenal reapair twice daily     UNABLE TO FIND Med Name: "T3" daily     UNABLE TO FIND Med Name: juice plus     UNABLE TO FIND Med Name: Ultrabiotic 100 mg daily     UNABLE TO FIND Med Name: reacted magnesium 4 x daily     UNABLE TO FIND Med Name: big hair daily     UNABLE TO FIND Med Name: Fibrinex daily     valACYclovir (VALTREX) 1000 MG tablet Take 2 tabs at onset of cold sore and repeat in 12 hours 84 tablet 0   Vitamin D, Ergocalciferol, (DRISDOL) 1.25 MG (50000 UT) CAPS capsule TAKE 1 CAPSULE BY MOUTH 2 TIMES A WEEK 8 capsule 0   Armodafinil (NUVIGIL) 50 MG tablet Take 50 mg by mouth See admin instructions. Take 50 mg in the morning and may take a second 50 mg dose as needed for alertness     benzonatate (TESSALON) 200 MG capsule TAKE 1 CAPSULE BY MOUTH TWICE A DAY AS NEEDED FOR COUGH 20 capsule 0   cetirizine (ZYRTEC) 10 MG tablet Take 10 mg by mouth daily as needed for allergies.     diphenhydrAMINE (BENADRYL ALLERGY) 25 MG tablet Take 25 mg by mouth every 6 (six) hours as needed.     methocarbamol (ROBAXIN) 500 MG tablet Take 500 mg by mouth at bedtime as needed for muscle spasms.     omeprazole (PRILOSEC) 20 MG capsule Take 1 capsule (20  mg total) by mouth daily. (Patient taking differently: Take 20 mg by mouth 2 (two) times daily before a meal. ) 90 capsule 3   predniSONE (DELTASONE) 10 MG tablet Take  4 each am x 2 days,   2 each am x 2 days,  1 each am x 2 days and stop 14 tablet 0   sulfamethoxazole-trimethoprim (BACTRIM DS) 800-160 MG tablet Take 1 tablet by mouth 2 (two) times daily. 14 tablet 0   No facility-administered medications prior to visit.     Review of Systems:   Constitutional:   No  weight loss, night sweats,  Fevers, chills, + fatigue, or  lassitude.  HEENT:   No headaches,  Difficulty swallowing,  Tooth/dental problems, or  Sore throat,                No sneezing, itching, ear ache,  +nasal congestion, post nasal drip,   CV:  No chest pain,  Orthopnea, PND, swelling in lower extremities, anasarca, dizziness, palpitations, syncope.   GI  No heartburn, indigestion, abdominal pain, nausea, vomiting, diarrhea, change in bowel habits, loss of appetite, bloody stools.   Resp:   No chest wall deformity  Skin: no rash or lesions.  GU: no dysuria, change in color of urine, no urgency or frequency.  No flank pain, no hematuria   MS:  No joint pain or swelling.  No decreased range of motion.  No back pain.    Physical Exam  BP 122/68 (BP Location: Left Arm, Cuff Size: Normal)    Pulse 99    Temp (!) 97.3 F (36.3 C) (Temporal)    Ht 5\' 1"  (1.549 m)    Wt 165 lb (74.8 kg)    SpO2 98% Comment: RA   BMI 31.18 kg/m   GEN: A/Ox3; pleasant , NAD, BMI 31   HEENT:  [place]/AT,   NOSE-clear,  THROAT-clear, no lesions, no postnasal drip or exudate noted.   NECK:  Supple w/ fair ROM; no JVD; normal carotid impulses w/o bruits; no thyromegaly or nodules palpated; no lymphadenopathy.    RESP  Clear  P & A; w/o, wheezes/ rales/ or rhonchi. no accessory muscle use, no dullness to percussion  CARD:  RRR, no m/r/g, no peripheral edema, pulses intact, no cyanosis or clubbing.  GI:   Soft & nt; nml bowel sounds; no  organomegaly or masses detected.   Musco: Warm bil, no deformities or joint swelling noted.   Neuro: alert, no focal deficits noted.    Skin: Warm, no lesions or rashes    Lab Results:  CBC   BNP No results found for: BNP  ProBNP   Imaging: No results found.    PFT Results Latest Ref Rng & Units 01/06/2013  FVC-Pre L 2.85  FVC-Predicted Pre % 93  FVC-Post L 2.79  FVC-Predicted Post % 91  Pre FEV1/FVC % % 80  Post FEV1/FCV % % 82  FEV1-Pre L 2.27  FEV1-Predicted Pre % 95  FEV1-Post L 2.28    No results found for: NITRICOXIDE      Assessment & Plan:   Upper airway cough syndrome Chronic cough-continue on aggressive regimen on trigger prevention cough control Referral to ENT for chronic rhinitis  Referral to GI for dysphagia/choking  Patient's medications were reviewed today and patient education was given. Computerized medication calendar was adjusted/completed   Plan  Patient Instructions  Refer to ENT .  Refer to GI for dysphagia and choking . Mucinex Twice daily  As needed  Cough /congestion  Change omeprazole 20 mg before meal Begin Pepcid 20 mg at bedtime Continue on Delsym 2 tsp Twice daily  As needed  Cough  Saline nasal rinses and saline nasal gel As needed   Tessalon Three times a day  As needed  Cough  Voice rest , sips of water to avoid throat clearing and coughing .  Follow medication calendar closely and bring to each visit .  Follow up with Dr. Sherene Sires  In 4-6 weeks and As needed   Please contact office for sooner follow up if symptoms do not improve or worsen or seek emergency care         Chronic rhinitis Referral to ENT for ongoing chronic sinus issues  Dysphagia Frequent episodes of dysphagia and choking.  Refer to GI     Rubye Oaks, NP 03/11/2019

## 2019-03-11 NOTE — Patient Instructions (Addendum)
Refer to ENT .  Refer to GI for dysphagia and choking . Mucinex Twice daily  As needed  Cough /congestion  Change omeprazole 20 mg before meal Begin Pepcid 20 mg at bedtime Continue on Delsym 2 tsp Twice daily  As needed  Cough  Saline nasal rinses and saline nasal gel As needed   Tessalon Three times a day  As needed  Cough  Voice rest , sips of water to avoid throat clearing and coughing .  Follow medication calendar closely and bring to each visit .  Follow up with Dr. Sherene Sires  In 4-6 weeks and As needed   Please contact office for sooner follow up if symptoms do not improve or worsen or seek emergency care

## 2019-03-13 ENCOUNTER — Encounter: Payer: Self-pay | Admitting: Family Medicine

## 2019-03-25 ENCOUNTER — Other Ambulatory Visit: Payer: Self-pay | Admitting: Family Medicine

## 2019-03-29 ENCOUNTER — Other Ambulatory Visit: Payer: Self-pay | Admitting: Internal Medicine

## 2019-04-19 ENCOUNTER — Ambulatory Visit: Payer: Managed Care, Other (non HMO) | Admitting: Internal Medicine

## 2019-04-20 ENCOUNTER — Telehealth: Payer: 59 | Admitting: Nurse Practitioner

## 2019-04-20 DIAGNOSIS — H5789 Other specified disorders of eye and adnexa: Secondary | ICD-10-CM

## 2019-04-20 MED ORDER — POLYMYXIN B-TRIMETHOPRIM 10000-0.1 UNIT/ML-% OP SOLN: 2.0000 [drp] | OPHTHALMIC | 0 refills | Status: DC

## 2019-04-20 NOTE — Progress Notes (Signed)
E-Visit for Pink Eye   We are sorry that you are not feeling well.  Here is how we plan to help!  Based on what you have shared with me it looks like you have conjunctivitis.  Conjunctivitis is a common inflammatory or infectious condition of the eye that is often referred to as "pink eye".  In most cases it is contagious (viral or bacterial). However, not all conjunctivitis requires antibiotics (ex. Allergic).  We have made appropriate suggestions for you based upon your presentation.  I have prescribed Polytrim Ophthalmic drops 1-2 drops 4 times a day times 5 days  Pink eye can be highly contagious.  It is typically spread through direct contact with secretions, or contaminated objects or surfaces that one may have touched.  Strict handwashing is suggested with soap and water is urged.  If not available, use alcohol based had sanitizer.  Avoid unnecessary touching of the eye.  If you wear contact lenses, you will need to refrain from wearing them until you see no white discharge from the eye for at least 24 hours after being on medication.  You should see symptom improvement in 1-2 days after starting the medication regimen.  Call us if symptoms are not improved in 1-2 days.  Home Care:  Wash your hands often!  Do not wear your contacts until you complete your treatment plan.  Avoid sharing towels, bed linen, personal items with a person who has pink eye.  See attention for anyone in your home with similar symptoms.  Get Help Right Away If:  Your symptoms do not improve.  You develop blurred or loss of vision.  Your symptoms worsen (increased discharge, pain or redness)  Your e-visit answers were reviewed by a board certified advanced clinical practitioner to complete your personal care plan.  Depending on the condition, your plan could have included both over the counter or prescription medications.  If there is a problem please reply  once you have received a response from your  provider.  Your safety is important to us.  If you have drug allergies check your prescription carefully.    You can use MyChart to ask questions about today's visit, request a non-urgent call back, or ask for a work or school excuse for 24 hours related to this e-Visit. If it has been greater than 24 hours you will need to follow up with your provider, or enter a new e-Visit to address those concerns.   You will get an e-mail in the next two days asking about your experience.  I hope that your e-visit has been valuable and will speed your recovery. Thank you for using e-visits.   5-10 minutes spent reviewing and documenting in chart.    

## 2019-04-25 ENCOUNTER — Encounter: Payer: Self-pay | Admitting: Adult Health

## 2019-04-28 ENCOUNTER — Ambulatory Visit: Payer: 59 | Attending: Internal Medicine

## 2019-04-28 DIAGNOSIS — Z23 Encounter for immunization: Secondary | ICD-10-CM

## 2019-04-28 NOTE — Progress Notes (Signed)
   Cov[facility] Vaccination [facility]  Name:  Claudia Luna    MRN: [id number] DOB: 1955-06-25  04/28/2019  Ms. Claudia Luna was observed post Covid-19 immunization for 15 minutes without incident. She was provided with Vaccine Information Sheet and instruction to access the V-Safe system.   Ms. Claudia Luna was instructed to call 911 with any severe reactions post vaccine: Marland Kitchen Difficulty breathing  . Swelling of face and throat  . A fast heartbeat  . A bad rash all over body  . Dizziness and weakness   Immunizations Administered    Name Date Dose VIS Date Route   Pfizer COVID-19 Vaccine 04/28/2019 12:46 PM 0.3 mL 01/14/2019 Intramuscular   Manufacturer: [employer], [employer]   Lot: [id number]   NDC: [address]-4

## 2019-05-16 ENCOUNTER — Other Ambulatory Visit: Payer: Self-pay | Admitting: Internal Medicine

## 2019-05-18 ENCOUNTER — Other Ambulatory Visit: Payer: Self-pay | Admitting: Family Medicine

## 2019-05-20 ENCOUNTER — Other Ambulatory Visit: Payer: Self-pay

## 2019-05-20 ENCOUNTER — Encounter: Payer: Self-pay | Admitting: Internal Medicine

## 2019-05-20 ENCOUNTER — Ambulatory Visit: Payer: 59 | Admitting: Internal Medicine

## 2019-05-20 DIAGNOSIS — R0989 Other specified symptoms and signs involving the circulatory and respiratory systems: Secondary | ICD-10-CM

## 2019-05-20 DIAGNOSIS — J989 Respiratory disorder, unspecified: Secondary | ICD-10-CM

## 2019-05-20 DIAGNOSIS — R058 Other specified cough: Secondary | ICD-10-CM

## 2019-05-20 DIAGNOSIS — R05 Cough: Secondary | ICD-10-CM

## 2019-05-20 NOTE — Patient Instructions (Addendum)
Please remember to go to the lab department   for your tests - we will call you with the results when they are available.      Try stopping the symbicort to see what the effect is - you should know within a week - if need albuterol need to be taking the symbicort    Ok to use the delsym and tessalon if needed but goal is to minimize them   Please schedule a follow up office visit in 6 weeks, call sooner if needed .

## 2019-05-20 NOTE — Progress Notes (Signed)
Subjective:     Patient ID: Claudia Luna, female   DOB: Mar 09, 1955   MRN: [id number]   Brief patient profile:  63 yowf never smoker perfectly healthy except for  dx of fibromyalgia in 1991 referred by Dr Artist Pais to pulmonary clinic 12/02/12 for refractory cough since 10/2011 with methacholine challenge 01/06/13 causing cough but only drop in fef 2575   History of Present Illness  12/02/2012 1st [facility] office visit/ [name] cc acute onset September 2013  facial pain, sore throat assoc with cough yellow mucus sorta like a head cold that persisted and some better with abx and cough turned more dry but persisted ever since. Only sob when coughing. No cough to vomit. Cough to point where sore in upper abd during cough and also midline upper chest Cough is daily wakes her up and decreases some p asleep.  rec  First take delsym two tsp every 12 hours and supplement if needed with oxy ir 5 mg  up to 2 every 4 hours to suppress the urge to cough at all or even clear your throat.  .   Pantoprazole (protonix) 40 mg   Take 30-60 min before first meal of the day and Pepcid 20 mg one bedtime until return to office    GERD  Diet   Prednisone 10 mg take  4 each am x 2 days,   2 each am x 2 days,  1 each am x 2 days and stop     12/17/2012 f/u ov/[name] re: cough x one year Chief Complaint  Patient presents with  . Acute Visit    Pt states that cough had improved while on prednisone, but has worsened since she stopped med 2 days ago.  Cough is non prod, but she feels congested in her chest.     sensation of globus, "not able to cough the fur ball up"  >>Neurontin Three times a day , cough control regimen w/ oxycodone GERD rx     12/28/12 Follow up  Returns for follow up and Med calendar .  reports cough is improved since last ov, congestion is resolved.  pred taper was extended on 11/20. Cough is better while she is using oxycodone. We discussed cough suppression regimen . Also discussed short  term use of narcotic for cough control and dangers of narcotic dependence. She verbalizes awareness.  Discussed GERD/AR /cough prevention regiemn.  Cough is mainly dry. No discolored mucus, fever, chest pain, orthopnea or edema.  rec Follow med calendar closely and bring to each visit.  We are setting you up for a Methacholine Challenge Test > coughed but only dropped in fef 25-75 ? Better p albuterol  Work on cough control regimen    01/11/2013 f/u ov/[name] re: chronic dry cough x one year Chief Complaint  Patient presents with  . 2 week follow up    Cough has improved since last OV.  still needs oxyir  at least twice daily in am w/in 30 min of rising and the 3 pm, ? Better p saba but not clear  rec Be sure you are taking the pepcid 20 mg in the evening  Increase gabapentin to 300 mg three times daily Tessilon 200 mg four times daily as per calendar Reduce vivelle Dot to one half twice  weekly Add singulair 10 mg one daily     02/10/2019  Re -Consult /[name]  Cough x 2013  On 300 mg bid gabapentin  Chief Complaint  Patient presents with  .  Pulmonary Consult    Referred by Dr. Mardelle Matte for eval of cough since Sept 2020.   Dyspnea: steps only, some bike riding ok  Cough: after stirs in am starts coughing but does not wake her up / worse since sept 2020 / minimal mucoid production Sleeping: 2 pillows flat bed  SABA use: twice daily seems to help despite symbicort 80 2bid  02: none  Ask your fibromyalgia doctor if you can come off the progesterone or at least use a lower dose  Stop benadryl (diphenydramine) and if still sleepy try off the zyrtec too Prednisone 10 mg take  4 each am x 2 days,   2 each am x 2 days,  1 each am x 2 days and stop  Prilosec 20 mg Take 30- 60 min before your first and last meals of the day  Tessilon 200 mg every 6 hours as needed GERD (REFLUX)  is an extremely common cause of respiratory symptoms just like yours , many times with no obvious heartburn at all.   Gabapentin 300 mg bfast, supper, bedtime for a couple of weeks then up to 4 x daily  Only use your albuterol as a rescue medication   05/20/2019  f/u ov/[name] re: cough x 2013  Chief Complaint  Patient presents with  . Follow-up    Pt states cough is "better". Pt states using symbicort once a day and tessalon pearls every 8 hours. SOB worse in Feb./March but is better now. Loose cough at times   Dyspnea:  Better  Cough: worse 8 h p tessalon and with ex  Sleeping: 2 pillows flat bed  SABA use: no albuterl 02: none   No obvious day to day or daytime variability or assoc excess/ purulent sputum or mucus plugs or hemoptysis or cp or chest tightness, subjective wheeze or overt sinus or hb symptoms.   Sleeping without nocturnal  or early am exacerbation  of respiratory  c/o's or need for noct saba. Also denies any obvious fluctuation of symptoms with weather or environmental changes or other aggravating or alleviating factors except as outlined above   No unusual exposure hx or h/o childhood pna/ asthma or knowledge of premature birth.  Current Allergies, Complete Past Medical History, Past Surgical History, Family History, and Social History were reviewed in [employer] record.  ROS  The following are not active complaints unless bolded Hoarseness, sore throat, dysphagia, dental problems, itching, sneezing,  nasal congestion or discharge of excess mucus or purulent secretions, ear ache,   fever, chills, sweats, unintended wt loss or wt gain, classically pleuritic or exertional cp,  orthopnea pnd or arm/hand swelling  or leg swelling, presyncope, palpitations, abdominal pain, anorexia, nausea, vomiting, diarrhea  or change in bowel habits or change in bladder habits, change in stools or change in urine, dysuria, hematuria,  rash, arthralgias, visual complaints, headache, numbness, weakness or ataxia or problems with walking or coordination,  change in mood or  memory.         Current Meds  Medication Sig  . albuterol (VENTOLIN HFA) 108 (90 Base) MCG/ACT inhaler Inhale 2 puffs into the lungs every 6 (six) hours as needed for wheezing or shortness of breath.  . Ascorbic Acid (VITAMIN C WITH ROSE HIPS) 1000 MG tablet Take 1,000 mg by mouth 2 (two) times daily.  . Ascorbic Acid (VITAMIN C) 1000 MG tablet Take 1,000 mg by mouth 2 (two) times daily.  . benzonatate (TESSALON) 200 MG capsule TAKE 1 CAPSULE BY  MOUTH 3 TIMES A DAY AS NEEDED FOR COUGH  . BIOTIN PO Take 1 Can by mouth daily.  . budesonide-formoterol (SYMBICORT) 80-4.5 MCG/ACT inhaler Inhale 2 puffs into the lungs 2 (two) times daily.  Marland Kitchen co-enzyme Q-10 30 MG capsule Take 100 mg by mouth daily. 3 tablets for dose of 300 mg  . Coenzyme Q10 (CO Q 10 PO) Take 1 capsule by mouth daily.  . Cyanocobalamin (B-12 PO) Take 1 tablet by mouth daily.  Marland Kitchen docusate sodium (COLACE) 100 MG capsule Take 100 mg by mouth 2 (two) times daily.  Marland Kitchen gabapentin (NEURONTIN) 300 MG capsule Take 1 capsule (300 mg total) by mouth 3 (three) times daily. (Patient taking differently: Take 300 mg by mouth 2 (two) times daily. )  . levothyroxine (SYNTHROID) 88 MCG tablet TAKE 1 TABLET (88 MCG TOTAL) BY MOUTH DAILY BEFORE BREAKFAST.  Marland Kitchen meclizine (ANTIVERT) 25 MG tablet Take 1 tablet (25 mg total) by mouth 3 (three) times daily as needed for dizziness.  . methocarbamol (ROBAXIN) 500 MG tablet Take 500 mg by mouth at bedtime as needed.  . montelukast (SINGULAIR) 10 MG tablet TAKE 1 TABLET BY MOUTH EVERYDAY AT BEDTIME  . Multiple Minerals (CHELATED MULTIPLE MINERAL PO) Take 1 tablet by mouth daily.  . Multiple Minerals (CHELATED MULTIPLE MINERAL PO) Take 2 tablets by mouth.  Marland Kitchen omeprazole (PRILOSEC) 20 MG capsule Take 1 capsule (20 mg total) by mouth daily.  . ondansetron (ZOFRAN) 4 MG tablet Take 1 tablet (4 mg total) by mouth every 8 (eight) hours as needed for nausea or vomiting.  Marland Kitchen PARoxetine (PAXIL) 20 MG tablet Take 1 tablet (20 mg total) by  mouth at bedtime.  . Pregnenolone 50 MG TABS Take 100 mg by mouth.  . Pregnenolone Micronized (PREGNENOLONE PO) Take 1 tablet by mouth daily.  Marland Kitchen PRESCRIPTION MEDICATION Take 1 capsule by mouth daily. T3 Liothyronine 20 mcg  . progesterone (PROMETRIUM) 200 MG capsule Take 200 mg by mouth daily.  . rizatriptan (MAXALT) 10 MG tablet TAKE 1 TABLET BY MOUTH DAILY AS NEEDED FOR HEADACHE  . rosuvastatin (CRESTOR) 10 MG tablet Take 1 tablet (10 mg total) by mouth at bedtime.  . tapentadol (NUCYNTA) 50 MG tablet Take 50 mg by mouth every 4 (four) hours as needed.  . trimethoprim-polymyxin b (POLYTRIM) ophthalmic solution Place 2 drops into both eyes every 4 (four) hours.  Marland Kitchen UNABLE TO FIND Med Name: L Glutaliothone twice daily  . UNABLE TO FIND Med Name: immunostim twice daily  . UNABLE TO FIND Med Name:adrenal reapair twice daily  . UNABLE TO FIND Med Name: "T3" daily  . UNABLE TO FIND Med Name: juice plus  . UNABLE TO FIND Med Name: Ultrabiotic 100 mg daily  . UNABLE TO FIND Med Name: reacted magnesium 4 x daily  . UNABLE TO FIND Med Name: big hair daily  . UNABLE TO FIND Med Name: Fibrinex daily  . valACYclovir (VALTREX) 1000 MG tablet Take 2 tabs at onset of cold sore and repeat in 12 hours  . Vitamin D, Ergocalciferol, (DRISDOL) 1.25 MG (50000 UT) CAPS capsule TAKE 1 CAPSULE BY MOUTH 2 TIMES A WEEK                  Objective:   Physical Exam  05/20/2019        170   02/10/2019         162   01/11/13 144 lb (65.318 kg)  12/28/12 140 lb 3.2 oz (63.594 kg)  12/23/12  140 lb (63.504 kg)     amb wf better voice texture   Vital signs reviewed  05/20/2019  - Note at rest 02 sats  96% on RA     HEENT : pt wearing mask not removed for exam due to covid -19 concerns.    NECK :  without JVD/Nodes/TM/ nl carotid upstrokes bilaterally   LUNGS: no acc muscle use,  Nl contour chest which is clear to A and P bilaterally without cough on insp or exp maneuvers   CV:  RRR  no s3 or murmur or  increase in P2, and no edema   ABD:  soft and nontender with nl inspiratory excursion in the supine position. No bruits or organomegaly appreciated, bowel sounds nl  MS:  Nl gait/ ext warm without deformities, calf tenderness, cyanosis or clubbing No obvious joint restrictions   SKIN: warm and dry without lesions    NEURO:  alert, approp, nl sensorium with  no motor or cerebellar deficits apparent.        Labs ordered 05/20/2019  :  allergy profile                 Assessment:

## 2019-05-21 ENCOUNTER — Encounter: Payer: Self-pay | Admitting: Internal Medicine

## 2019-05-21 NOTE — Assessment & Plan Note (Addendum)
Onset 10/2011 - Sinus CT 06/04/12 Minimal maxillary mucoperiosteal thickening. -methacholine challenge 01/06/13 causing cough but only drop in fef 2575  - neurontin rx 12/18/2012 > increased to 300 tid 01/11/13 plus added singulair - Allergy profile 05/20/2019 >  Eos 184 /  IgE  - trial off symbicort 05/20/2019 > resume if cough flares on singulair     Improved but still has "tickle" which is just as likely being caused by ICS so reasonable to try off symbicort/ continue singulair  as long as not breaking the rule of two's/ advised          Each maintenance medication was reviewed in detail including emphasizing most importantly the difference between maintenance and prns and under what circumstances the prns are to be triggered using an action plan format where appropriate.  Total time for H and P, chart review, counseling, review hfa device and generating customized AVS unique to this office visit / charting = 20 min

## 2019-05-23 LAB — CBC WITH DIFFERENTIAL/PLATELET
Absolute Monocytes: 515 cells/uL (ref 200–950)
Basophils Absolute: 92 cells/uL (ref 0–200)
Basophils Relative: 1 %
Eosinophils Absolute: 184 cells/uL (ref 15–500)
Eosinophils Relative: 2 %
HCT: 47.7 % — ABNORMAL HIGH (ref 35.0–45.0)
Hemoglobin: 15.9 g/dL — ABNORMAL HIGH (ref 11.7–15.5)
Lymphs Abs: 2677 cells/uL (ref 850–3900)
MCH: 29.8 pg (ref 27.0–33.0)
MCHC: 33.3 g/dL (ref 32.0–36.0)
MCV: 89.5 fL (ref 80.0–100.0)
MPV: 12.3 fL (ref 7.5–12.5)
Monocytes Relative: 5.6 %
Neutro Abs: 5732 cells/uL (ref 1500–7800)
Neutrophils Relative %: 62.3 %
Platelets: 371 10*3/uL (ref 140–400)
RBC: 5.33 10*6/uL — ABNORMAL HIGH (ref 3.80–5.10)
RDW: 12.9 % (ref 11.0–15.0)
Total Lymphocyte: 29.1 %
WBC: 9.2 10*3/uL (ref 3.8–10.8)

## 2019-05-23 LAB — IGE: IgE (Immunoglobulin E), Serum: <2 kU/L (ref <=114)

## 2019-05-23 NOTE — Progress Notes (Signed)
Patient identification verified. Results of recent CBC reviewed. Per Dr. Sherene Sires, studies are unremarkable, no change in recommendations. Patient verbalized understanding of results.

## 2019-05-25 ENCOUNTER — Ambulatory Visit: Payer: 59 | Attending: Internal Medicine

## 2019-05-25 DIAGNOSIS — Z23 Encounter for immunization: Secondary | ICD-10-CM

## 2019-05-25 NOTE — Progress Notes (Signed)
   Covid-19 Vaccination Clinic  Name:  Claudia Luna    MRN: [id number] DOB: 1955/07/22  05/25/2019  [name] was observed post Covid-19 immunization for 15 minutes without incident. She was provided with Vaccine Information Sheet and instruction to access the V-Safe system.   [name] was instructed to call 911 with any severe reactions post vaccine: Marland Kitchen Difficulty breathing  . Swelling of face and throat  . A fast heartbeat  . A bad rash all over body  . Dizziness and weakness   Immunizations Administered    Name Date Dose VIS Date Route   Pfizer COVID-19 Vaccine 05/25/2019 12:52 PM 0.3 mL 03/30/2018 Intramuscular   Manufacturer: [employer], [employer]   Lot: [id number]   NDC: [address]-1

## 2019-06-09 ENCOUNTER — Other Ambulatory Visit: Payer: Self-pay | Admitting: Family Medicine

## 2019-06-09 DIAGNOSIS — G43909 Migraine, unspecified, not intractable, without status migrainosus: Secondary | ICD-10-CM

## 2019-06-19 ENCOUNTER — Other Ambulatory Visit: Payer: Self-pay | Admitting: Family Medicine

## 2019-07-08 ENCOUNTER — Ambulatory Visit: Payer: 59 | Admitting: Internal Medicine

## 2019-07-08 ENCOUNTER — Other Ambulatory Visit: Payer: Self-pay | Admitting: Internal Medicine

## 2019-08-06 ENCOUNTER — Other Ambulatory Visit: Payer: Self-pay | Admitting: Family Medicine

## 2019-10-07 ENCOUNTER — Other Ambulatory Visit: Payer: Self-pay | Admitting: Family Medicine

## 2019-10-16 ENCOUNTER — Other Ambulatory Visit: Payer: Self-pay | Admitting: Family Medicine

## 2019-11-12 ENCOUNTER — Other Ambulatory Visit: Payer: Self-pay | Admitting: Family Medicine

## 2019-11-13 ENCOUNTER — Other Ambulatory Visit: Payer: Self-pay | Admitting: Family Medicine

## 2019-11-15 ENCOUNTER — Encounter: Payer: Managed Care, Other (non HMO) | Admitting: Family Medicine

## 2019-12-14 ENCOUNTER — Other Ambulatory Visit: Payer: Self-pay | Admitting: Family Medicine

## 2020-02-08 ENCOUNTER — Other Ambulatory Visit: Payer: Self-pay | Admitting: Family Medicine

## 2020-02-10 ENCOUNTER — Other Ambulatory Visit: Payer: Self-pay

## 2020-02-10 ENCOUNTER — Emergency Department (HOSPITAL_COMMUNITY)
Admission: EM | Admit: 2020-02-10 | Discharge: 2020-02-11 | Disposition: A | Discharge status: Home / Self Care | Payer: 59 | Attending: Emergency Medicine | Admitting: Emergency Medicine

## 2020-02-10 DIAGNOSIS — R070 Pain in throat: Secondary | ICD-10-CM | POA: Diagnosis not present

## 2020-02-10 DIAGNOSIS — Z5321 Procedure and treatment not carried out due to patient leaving prior to being seen by health care provider: Secondary | ICD-10-CM | POA: Diagnosis not present

## 2020-02-10 DIAGNOSIS — R0602 Shortness of breath: Secondary | ICD-10-CM | POA: Insufficient documentation

## 2020-02-10 LAB — CBC
HCT: 40.1 % (ref 36.0–46.0)
Hemoglobin: 13.9 g/dL (ref 12.0–15.0)
MCH: 29.7 pg (ref 26.0–34.0)
MCHC: 34.7 g/dL (ref 30.0–36.0)
MCV: 85.7 fL (ref 80.0–100.0)
Platelets: 329 10*3/uL (ref 150–400)
RBC: 4.68 MIL/uL (ref 3.87–5.11)
RDW: 14.1 % (ref 11.5–15.5)
WBC: 7 10*3/uL (ref 4.0–10.5)
nRBC: 0 % (ref 0.0–0.2)

## 2020-02-10 MED ORDER — ALBUTEROL SULFATE HFA 108 (90 BASE) MCG/ACT IN AERS: 2.0000 | INHALATION_SPRAY | RESPIRATORY_TRACT | Status: DC | PRN

## 2020-02-10 NOTE — ED Notes (Signed)
Pt left ED, she stated that she did not want to keep waiting and wanted to be in her own ed. Pt armband has been cut off.

## 2020-02-10 NOTE — ED Triage Notes (Signed)
Pt states she tested positive for covid Monday, was told by the RN that called with her results tonight to come in for eval of SHOB. O2 sats 100%, NAD noted in triage, pt hoarse. Pt states OTC medications have helped symptoms. Inquiring about MAB infusion Pt is vaccinated & boosted

## 2020-02-11 LAB — BASIC METABOLIC PANEL
Anion gap: 11 (ref 5–15)
BUN: 7 mg/dL — ABNORMAL LOW (ref 8–23)
CO2: 25 mmol/L (ref 22–32)
Calcium: 8.8 mg/dL — ABNORMAL LOW (ref 8.9–10.3)
Chloride: 105 mmol/L (ref 98–111)
Creatinine, Ser: 1 mg/dL (ref 0.44–1.00)
GFR, Estimated: >60 mL/min (ref >60)
Glucose, Bld: 130 mg/dL — ABNORMAL HIGH (ref 70–99)
Potassium: 2.9 mmol/L — ABNORMAL LOW (ref 3.5–5.1)
Sodium: 141 mmol/L (ref 135–145)

## 2020-02-12 ENCOUNTER — Encounter: Payer: Self-pay | Admitting: Family Medicine

## 2020-02-13 ENCOUNTER — Telehealth: Payer: Self-pay

## 2020-02-13 MED ORDER — AZITHROMYCIN 250 MG PO TABS: ORAL_TABLET | ORAL | 0 refills | Status: DC

## 2020-02-13 NOTE — Telephone Encounter (Signed)
Nurse Assessment Nurse: Vear Clock, RN, Elease Hashimoto Date/Time Lamount Cohen Time): 02/10/2020 8:52:22 PM Confirm and document reason for call. If symptomatic, describe symptoms. ---Caller states his wife tested positive for covid. She is coughing so much she cannot get any rest. She had a fever which has now subsided. She is beat down, blowing thick green mucus ,brownish red.,SOB and hoarse. Her s&s are much worse. She would like a referral for antibody tx. Hx of chronic fatigue, fibromyalgia. Does the patient have any new or worsening symptoms? ---Yes Will a triage be completed? ---Yes Related visit to physician within the last 2 weeks? ---No Does the PT have any chronic conditions? (i.e. diabetes, asthma, this includes High risk factors for pregnancy, etc.) ---Yes List chronic conditions. ---chronic fatigue, fibromyalgia. Is this a behavioral health or substance abuse call? ---No Guidelines Guideline Title Affirmed Question Affirmed Notes Nurse Date/Time (Eastern Time) COVID-19 - Diagnosed or Suspected SEVERE difficulty breathing (e.g., struggling Emiliano Dyer 02/10/2020 8:55:07 PM PLEASE NOTE: All timestamps contained within this report are represented as [place] Standard Time. CONFIDENTIALTY NOTICE: This fax transmission is intended only for the addressee. It contains information that is legally privileged, confidential or otherwise protected from use or disclosure. If you are not the intended recipient, you are strictly prohibited from reviewing, disclosing, copying using or disseminating any of this information or taking any action in reliance on or regarding this information. If you have received this fax in error, please notify us immediately by telephone so that we can arrange for its return to [place]. Phone: [phone], Toll-Free: [phone], Fax: [phone] Page: 2 of 2 Call Id: [id number] Guidelines Guideline Title Affirmed Question Affirmed Notes Nurse Date/Time  Lamount Cohen Time) for each breath, speaks in single words) Disp. Time Lamount Cohen Time) Disposition Final User 02/10/2020 8:51:10 PM Send to Urgent Bubba Camp, Rudi 02/10/2020 8:57:24 PM 911 Outcome Documentation Vear Clock RN, Elease Hashimoto Reason: refused 02/10/2020 8:56:51 PM Call EMS 911 Now Yes Vear Clock, RN, Ancil Boozer Disagree/Comply Disagree Caller Understands Yes PreDisposition Call Doctor Care Advice Given Per Guideline CALL EMS 911 NOW: TELL AMBULANCE MEDICS ABOUT YOUR COVID-19 DIAGNOSIS: * Tell the paramedic right away that you probably have COVID-19. * The paramedics should call ahead to the emergency department to let them know. CARE ADVICE given per COVID-19 - DIAGNOSED OR SUSPECTED (Adult) guideline. Referrals GO TO FACILITY REFUSE

## 2020-02-13 NOTE — Telephone Encounter (Signed)
Can refer for treatment via epic

## 2020-02-13 NOTE — Telephone Encounter (Signed)
FYI

## 2020-02-15 NOTE — Telephone Encounter (Signed)
Mychart message sent to patient by PCP.

## 2020-02-17 ENCOUNTER — Telehealth: Payer: 59 | Admitting: Family

## 2020-02-17 DIAGNOSIS — U071 COVID-19: Secondary | ICD-10-CM

## 2020-02-17 MED ORDER — PROMETHAZINE-DM 6.25-15 MG/5ML PO SYRP: 5.0000 mL | ORAL_SOLUTION | Freq: Four times a day (QID) | ORAL | 0 refills | Status: DC | PRN

## 2020-02-17 MED ORDER — PREDNISONE 20 MG PO TABS: 40.0000 mg | ORAL_TABLET | Freq: Every day | ORAL | 0 refills | Status: DC

## 2020-02-17 NOTE — Progress Notes (Signed)
E-Visit for Corona Virus Screening  We are sorry you are not feeling well. We are here to help!  You have tested positive for COVID-19, meaning that you were infected with the novel coronavirus and could give the virus to others.  It is vitally important that you stay home so you do not spread it to others.      Please continue isolation at home, for at least 10 days since the start of your symptoms and until you have had 24 hours with no fever (without taking a fever reducer) and with improving of symptoms.  If you have no symptoms but tested positive (or all symptoms resolve after 5 days and you have no fever) you can leave your house but continue to wear a mask around others for an additional 5 days. If you have a fever,continue to stay home until you have had 24 hours of no fever. Most cases improve 5-10 days from onset but we have seen a small number of patients who have gotten worse after the 10 days.  Please be sure to watch for worsening symptoms and remain taking the proper precautions.   Go to the nearest hospital ED for assessment if fever/cough/breathlessness are severe or illness seems like a threat to life.    The following symptoms may appear 2-14 days after exposure: . Fever . Cough . Shortness of breath or difficulty breathing . Chills . Repeated shaking with chills . Muscle pain . Headache . Sore throat . New loss of taste or smell . Fatigue . Congestion or runny nose . Nausea or vomiting . Diarrhea  You have been enrolled in [facility] Monitoring for COVID-19. Daily you will receive a questionnaire within the MyChart website. Our COVID-19 response team will be monitoring your responses daily.  You can use medication such as A prescription cough medication called Phenergan DM 6.25 mg/15 mg. You make take one teaspoon / 5 ml every 4-6 hours as needed for cough  Prednisone was also sent to the pharmacy for you.    You may also take acetaminophen (Tylenol) as needed  for fever.  HOME CARE: . Only take medications as instructed by your medical team. . Drink plenty of fluids and get plenty of rest. . A steam or ultrasonic humidifier can help if you have congestion.   GET HELP RIGHT AWAY IF YOU HAVE EMERGENCY WARNING SIGNS.  Call 911 or proceed to your closest emergency facility if: . You develop worsening high fever. . Trouble breathing . Bluish lips or face . Persistent pain or pressure in the chest . New confusion . Inability to wake or stay awake . You cough up blood. . Your symptoms become more severe . Inability to hold down food or fluids  This list is not all possible symptoms. Contact your medical provider for any symptoms that are severe or concerning to you.    Your e-visit answers were reviewed by a board certified advanced clinical practitioner to complete your personal care plan.  Depending on the condition, your plan could have included both over the counter or prescription medications.  If there is a problem please reply once you have received a response from your provider.  Your safety is important to [place].  If you have drug allergies check your prescription carefully.    You can use MyChart to ask questions about today's visit, request a non-urgent call back, or ask for a work or school excuse for 24 hours related to this e-Visit. If it  has been greater than 24 hours you will need to follow up with your provider, or enter a new e-Visit to address those concerns. You will get an e-mail in the next two days asking about your experience.  I hope that your e-visit has been valuable and will speed your recovery. Thank you for using e-visits.     Greater than 5 minutes, yet less than 10 minutes of time have been spent researching, coordinating, and implementing care for this patient today.  Thank you for the details you included in the comment boxes. Those details are very helpful in determining the best course of treatment for you and  help [place] to provide the best care.

## 2020-02-23 ENCOUNTER — Other Ambulatory Visit: Payer: Self-pay | Admitting: *Deleted

## 2020-02-23 NOTE — Telephone Encounter (Signed)
Ok for one more refill but ov before this runs out to regroup and have her bring all active meds including otcs to office

## 2020-02-23 NOTE — Telephone Encounter (Signed)
Attempted to call pt to further discuss the refill request and to get a f/u appt scheduled but unable to reach. Left message for her to return call so we can get f/u appt scheduled.

## 2020-02-23 NOTE — Telephone Encounter (Signed)
Patient requesting refill Tessalon perles Last ov 05/20/19 Patient was suppose to schedule f/u in 6 wks. Left patient a message to call back and schedule appt to be re-evaluated.

## 2020-02-24 ENCOUNTER — Other Ambulatory Visit: Payer: Self-pay | Admitting: Family Medicine

## 2020-02-24 NOTE — Telephone Encounter (Signed)
Please call patient. She is overdue for an appointment: her last complete physical was in 2019. Her last visit for cough with me was in 01/2019.  I have refilled her Paxil for 90 days. I cannot refill any other medications or refills without an office visit.  Please schedule for OV for f/u medical problems and may need separate appt for CPE. Thanks.

## 2020-02-24 NOTE — Telephone Encounter (Signed)
Left a voicemail asking patient to callback.

## 2020-02-27 MED ORDER — BENZONATATE 200 MG PO CAPS: ORAL_CAPSULE | ORAL | 0 refills | Status: DC

## 2020-02-27 NOTE — Telephone Encounter (Signed)
LMTCB and closing per protocol  I will go ahead and send the refill with note to pharmacy that she will be given no more without appt

## 2020-03-17 ENCOUNTER — Other Ambulatory Visit: Payer: Self-pay | Admitting: Family Medicine

## 2020-03-18 ENCOUNTER — Other Ambulatory Visit: Payer: Self-pay

## 2020-03-18 ENCOUNTER — Encounter: Payer: Self-pay | Admitting: Family Medicine

## 2020-03-18 ENCOUNTER — Other Ambulatory Visit: Payer: Self-pay | Admitting: Family Medicine

## 2020-03-19 ENCOUNTER — Other Ambulatory Visit: Payer: Self-pay | Admitting: Family Medicine

## 2020-03-21 ENCOUNTER — Other Ambulatory Visit: Payer: Self-pay

## 2020-03-21 MED ORDER — BUDESONIDE-FORMOTEROL FUMARATE 80-4.5 MCG/ACT IN AERO: 2.0000 | INHALATION_SPRAY | Freq: Two times a day (BID) | RESPIRATORY_TRACT | 5 refills | Status: DC

## 2020-03-21 NOTE — Telephone Encounter (Signed)
Patient has not been in the office to see me in over a year.  Can refill tessalon perles.  I will not refill any chronic medications for her.  Thanks.

## 2020-03-25 ENCOUNTER — Other Ambulatory Visit: Payer: Self-pay | Admitting: Adult Health

## 2020-03-28 ENCOUNTER — Other Ambulatory Visit: Payer: Self-pay | Admitting: Family Medicine

## 2020-05-20 ENCOUNTER — Other Ambulatory Visit: Payer: Self-pay | Admitting: Family Medicine

## 2020-05-23 ENCOUNTER — Other Ambulatory Visit: Payer: Self-pay | Admitting: Family Medicine

## 2020-05-23 DIAGNOSIS — G43909 Migraine, unspecified, not intractable, without status migrainosus: Secondary | ICD-10-CM

## 2020-05-29 ENCOUNTER — Other Ambulatory Visit: Payer: Self-pay

## 2020-05-29 ENCOUNTER — Ambulatory Visit: Payer: 59 | Admitting: Family Medicine

## 2020-05-29 ENCOUNTER — Encounter: Payer: Self-pay | Admitting: Family Medicine

## 2020-05-29 VITALS — BP 110/70 | HR 103 | Temp 97.8°F | Resp 16 | Ht 61.0 in | Wt 137.4 lb

## 2020-05-29 DIAGNOSIS — E782 Mixed hyperlipidemia: Secondary | ICD-10-CM

## 2020-05-29 DIAGNOSIS — E039 Hypothyroidism, unspecified: Secondary | ICD-10-CM

## 2020-05-29 DIAGNOSIS — Z79899 Other long term (current) drug therapy: Secondary | ICD-10-CM | POA: Diagnosis not present

## 2020-05-29 DIAGNOSIS — Z1231 Encounter for screening mammogram for malignant neoplasm of breast: Secondary | ICD-10-CM

## 2020-05-29 DIAGNOSIS — F411 Generalized anxiety disorder: Secondary | ICD-10-CM

## 2020-05-29 DIAGNOSIS — Z23 Encounter for immunization: Secondary | ICD-10-CM | POA: Diagnosis not present

## 2020-05-29 DIAGNOSIS — G43009 Migraine without aura, not intractable, without status migrainosus: Secondary | ICD-10-CM

## 2020-05-29 DIAGNOSIS — M797 Fibromyalgia: Secondary | ICD-10-CM

## 2020-05-29 DIAGNOSIS — G43909 Migraine, unspecified, not intractable, without status migrainosus: Secondary | ICD-10-CM

## 2020-05-29 DIAGNOSIS — Z1211 Encounter for screening for malignant neoplasm of colon: Secondary | ICD-10-CM

## 2020-05-29 DIAGNOSIS — G250 Essential tremor: Secondary | ICD-10-CM

## 2020-05-29 DIAGNOSIS — Z1212 Encounter for screening for malignant neoplasm of rectum: Secondary | ICD-10-CM

## 2020-05-29 DIAGNOSIS — R058 Other specified cough: Secondary | ICD-10-CM

## 2020-05-29 LAB — COMPREHENSIVE METABOLIC PANEL
ALT: 14 U/L (ref 0–35)
AST: 20 U/L (ref 0–37)
Albumin: 4.6 g/dL (ref 3.5–5.2)
Alkaline Phosphatase: 80 U/L (ref 39–117)
BUN: 17 mg/dL (ref 6–23)
CO2: 31 mEq/L (ref 19–32)
Calcium: 10.3 mg/dL (ref 8.4–10.5)
Chloride: 99 mEq/L (ref 96–112)
Creatinine, Ser: 1.26 mg/dL — ABNORMAL HIGH (ref 0.40–1.20)
GFR: 45.49 mL/min — ABNORMAL LOW (ref >60.00)
Glucose, Bld: 88 mg/dL (ref 70–99)
Potassium: 4.1 mEq/L (ref 3.5–5.1)
Sodium: 139 mEq/L (ref 135–145)
Total Bilirubin: 0.9 mg/dL (ref 0.2–1.2)
Total Protein: 7.7 g/dL (ref 6.0–8.3)

## 2020-05-29 LAB — CBC WITH DIFFERENTIAL/PLATELET
Basophils Absolute: 0.1 10*3/uL (ref 0.0–0.1)
Basophils Relative: 0.8 % (ref 0.0–3.0)
Eosinophils Absolute: 0.2 10*3/uL (ref 0.0–0.7)
Eosinophils Relative: 2.7 % (ref 0.0–5.0)
HCT: 47.3 % — ABNORMAL HIGH (ref 36.0–46.0)
Hemoglobin: 16 g/dL — ABNORMAL HIGH (ref 12.0–15.0)
Lymphocytes Relative: 32.4 % (ref 12.0–46.0)
Lymphs Abs: 2.4 10*3/uL (ref 0.7–4.0)
MCHC: 33.8 g/dL (ref 30.0–36.0)
MCV: 88.5 fl (ref 78.0–100.0)
Monocytes Absolute: 0.7 10*3/uL (ref 0.1–1.0)
Monocytes Relative: 9.4 % (ref 3.0–12.0)
Neutro Abs: 4.1 10*3/uL (ref 1.4–7.7)
Neutrophils Relative %: 54.7 % (ref 43.0–77.0)
Platelets: 296 10*3/uL (ref 150.0–400.0)
RBC: 5.34 Mil/uL — ABNORMAL HIGH (ref 3.87–5.11)
RDW: 14 % (ref 11.5–15.5)
WBC: 7.5 10*3/uL (ref 4.0–10.5)

## 2020-05-29 LAB — LIPID PANEL
Cholesterol: 153 mg/dL (ref 0–200)
HDL: 68.8 mg/dL (ref >39.00)
LDL Cholesterol: 66 mg/dL (ref 0–99)
NonHDL: 84.35
Total CHOL/HDL Ratio: 2
Triglycerides: 92 mg/dL (ref 0.0–149.0)
VLDL: 18.4 mg/dL (ref 0.0–40.0)

## 2020-05-29 LAB — TSH: TSH: 7.09 u[IU]/mL — ABNORMAL HIGH (ref 0.35–4.50)

## 2020-05-29 LAB — B12 AND FOLATE PANEL
Folate: 13 ng/mL (ref >5.9)
Vitamin B-12: 337 pg/mL (ref 211–911)

## 2020-05-29 LAB — VITAMIN D 25 HYDROXY (VIT D DEFICIENCY, FRACTURES): VITD: 39 ng/mL (ref 30.00–100.00)

## 2020-05-29 MED ORDER — PAROXETINE HCL 20 MG PO TABS: 20.0000 mg | ORAL_TABLET | Freq: Every day | ORAL | 3 refills | Status: DC

## 2020-05-29 MED ORDER — RIZATRIPTAN BENZOATE 10 MG PO TABS: 10.0000 mg | ORAL_TABLET | Freq: Once | ORAL | 2 refills | Status: DC | PRN

## 2020-05-29 MED ORDER — MONTELUKAST SODIUM 10 MG PO TABS: 10.0000 mg | ORAL_TABLET | Freq: Every day | ORAL | 3 refills | Status: DC

## 2020-05-29 MED ORDER — OMEPRAZOLE 20 MG PO CPDR: 20.0000 mg | DELAYED_RELEASE_CAPSULE | Freq: Every day | ORAL | 3 refills | Status: DC

## 2020-05-29 NOTE — Patient Instructions (Signed)
Please return in 3 months for your annual complete physical.  I will release your lab results to you on your MyChart account with further instructions. Please reply with any questions.   I will refill your levothyroxine and crestor after your lab results return.   I have referred you for mammogram and to [facility] for mammogram and colonoscopy respectively.   Today you were given your #1 Shingrix vaccination. We will give you your second dose at your next visit in 3 months.   Biotin can interfere with TSH (thyroid testing); we may need you to stop it and recheck your levels prior to changing your dose.   If you have any questions or concerns, please don't hesitate to send me a message via MyChart or call the office at [phone]. Thank you for visiting with [place] today! It's our pleasure caring for you.  I'd like to see you for an annual complete physical and then a 6 month chronic medical office visit, so at minimum, twice a year. We will start with your physical in 3 months.   Glad you have mostly recovered from your covid infection.

## 2020-05-30 NOTE — Progress Notes (Signed)
Subjective  CC:  Chief Complaint  Patient presents with  . Medication Refill    Montelukast, Omeprazole, Levothyroxine, Crestor, Paxil, Vit D3, Maxalt, Zofran, Valtrex  . Thyroid Blood Work    Fasting    HPI: Claudia Luna is a 65 y.o. female who presents to the office today to address the problems listed above in the chief complaint.  65 year old with multiple medical problems, last in the office in December 2020.  Here for chronic problem follow-up.  Overdue for health maintenance, complete physicals.  Counseling was given today to encourage appropriate follow-up.  Fibromyalgia: Patient reports this is very active.  She has had since adolescence.  Currently very limiting.  No longer seeing an integrative specialist [place].  No longer seeing a rheumatologist.  Has failed most conservative management strategies and most medications.  Currently takes gabapentin 300 mg twice daily.  Quality life is negatively impacted, stays home most of the day.  Does not take pain medicines.  She reports this is her normal.  Anxiety disorder: She reports her mood is fairly well controlled in spite of chronic pain.  Takes Paxil 20 mg daily.  She does need refill.  Denies depressive symptoms.  Hypothyroidism: Had been treated by integrative specialist in [place].  Last checked over a year ago.  They had been keeping her TSH below normal.  She denies symptoms of hyperthyroidism.  She is currently taking 88 mcg daily.  Hyperlipidemia: Overdue for recheck.  She takes Crestor nightly.  She does not feel this impacts her pain.  Due for recheck.  She is fasting.  Chronic recurrent migraines: Has been seeing headache specialist in the past.  Treated with abortive medication.  Needs refills of Maxalt.  She says she uses this weekly.  She is not sure why she is not on preventatives.  Upper airway cough syndrome without diagnosis of asthma treated with allergy medicines and inhalers.  This is managed by  pulmonology, Dr. Elesa Massed.  GERD: Reports good control but uses omeprazole 20 mg daily.  Needs refill.  Health maintenance: Overdue for breast cancer screening, colon cancer screening, eligible for shingles vaccination, COVID booster and Tdap.  Overdue for complete physical   Assessment  1. Fibromyalgia, primary   2. GAD (generalized anxiety disorder)   3. Hypothyroidism (acquired)   4. Mixed hyperlipidemia   5. Migraine without aura and without status migrainosus, not intractable   6. Upper airway cough syndrome   7. Essential tremor   8. Long-term current use of proton pump inhibitor therapy   9. Encounter for screening mammogram for breast cancer   10. Screening for colorectal cancer   11. Migraine without status migrainosus, not intractable, unspecified migraine type   12. Need for shingles vaccine      Plan   Chronic medical problems as listed above: Discussed each in detail.  Recheck thyroid and cholesterol on and liver panel.  Refill medications for anxiety disorder, migraines and GERD.  Rule out vitamin deficiencies.  Check renal electrolytes.  We will adjust thyroid medicines if indicated.  We will adjust statin dose if indicated.  She will need refills for both of those.  Ordered screening tests that are appropriate.  Updated shingles vaccination first dose today.  Recommend COVID booster.  Long discussion on behavioral management strategies for mood and chronic pain.  Recommend regular visits to manage her chronic medical problems.  Follow up: 3 months for complete physical.   Orders Placed This Encounter  Procedures  . MM DIGITAL  SCREENING BILATERAL  . Varicella-zoster vaccine IM (Shingrix)  . CBC with Differential/Platelet  . Comprehensive metabolic panel  . B12 and Folate Panel  . Lipid panel  . TSH  . VITAMIN D 25 Hydroxy (Vit-D Deficiency, Fractures)  . Ambulatory referral to Gastroenterology   Meds ordered this encounter  Medications  . montelukast (SINGULAIR)  10 MG tablet    Sig: Take 1 tablet (10 mg total) by mouth at bedtime.    Dispense:  90 tablet    Refill:  3  . DISCONTD: PARoxetine (PAXIL) 20 MG tablet    Sig: Take 1 tablet (20 mg total) by mouth daily.    Dispense:  90 tablet    Refill:  3  . rizatriptan (MAXALT) 10 MG tablet    Sig: Take 1 tablet (10 mg total) by mouth once as needed. May repeat in 2 hours if needed    Dispense:  20 tablet    Refill:  2  . DISCONTD: omeprazole (PRILOSEC) 20 MG capsule    Sig: Take 1 capsule (20 mg total) by mouth daily.    Dispense:  90 capsule    Refill:  3      I reviewed the patients updated PMH, FH, and SocHx.    Patient Active Problem List   Diagnosis Date Noted  . Chronic narcotic dependence (HCC) 02/17/2017    Priority: High  . Family history of breast cancer in first degree relative 02/17/2017    Priority: High  . Fibromyalgia, primary 12/24/2016    Priority: High  . GAD (generalized anxiety disorder) 12/24/2016    Priority: High  . Chronic pain syndrome 12/24/2016    Priority: High  . Mixed hyperlipidemia 01/19/2007    Priority: High  . Hypothyroidism (acquired) 09/01/2006    Priority: High  . OA (osteoarthritis) of knee 07/24/2017    Priority: Medium  . History of herniated intervertebral disc 12/24/2016    Priority: Medium  . Migraine headache without aura 12/06/2015    Priority: Medium  . Upper airway cough syndrome 04/13/2012    Priority: Medium  . Reactive airway disease that is not asthma 12/29/2007    Priority: Medium  . 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    Priority: Medium  . Essential tremor 02/17/2017    Priority: Low  . Vertigo 12/24/2016    Priority: Low  . 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 Chronic rhinitis 03/11/2019  . Dysphagia 03/11/2019  . History of total knee replacement, left 03/08/2018   Current Meds  Medication Sig  . albuterol (VENTOLIN HFA) 108 (90 Base) MCG/ACT inhaler Inhale 2 puffs into the lungs every 6 (six) hours as  needed for wheezing or shortness of breath.  . Ascorbic Acid (VITAMIN C WITH ROSE HIPS) 1000 MG tablet Take 1,000 mg by mouth 2 (two) times daily.  . Ascorbic Acid (VITAMIN C) 1000 MG tablet Take 1,000 mg by mouth 2 (two) times daily.  Marland Kitchen BIOTIN PO Take 1 Can by mouth daily.  . budesonide-formoterol (SYMBICORT) 80-4.5 MCG/ACT inhaler Inhale 2 puffs into the lungs 2 (two) times daily.  Marland Kitchen co-enzyme Q-10 30 MG capsule Take 100 mg by mouth daily. 3 tablets for dose of 300 mg  . Cyanocobalamin (B-12 PO) Take 1 tablet by mouth daily.  Marland Kitchen gabapentin (NEURONTIN) 300 MG capsule Take 1 capsule (300 mg total) by mouth 3 (three) times daily. (Patient taking differently: Take 300 mg by mouth 2 (two) times daily.)  . levothyroxine (SYNTHROID) 88 MCG tablet  TAKE 1 TABLET (88 MCG TOTAL) BY MOUTH DAILY BEFORE BREAKFAST.  . Multiple Minerals (CHELATED MULTIPLE MINERAL PO) Take 2 tablets by mouth.  . ondansetron (ZOFRAN) 4 MG tablet TAKE 1 TABLET BY MOUTH EVERY 8 HOURS AS NEEDED FOR NAUSEA AND VOMITING  . Pregnenolone 50 MG TABS Take 100 mg by mouth.  . Pregnenolone Micronized (PREGNENOLONE PO) Take 1 tablet by mouth daily.  . rosuvastatin (CRESTOR) 10 MG tablet TAKE 1 TABLET BY MOUTH EVERYDAY AT BEDTIME  . tapentadol (NUCYNTA) 50 MG tablet Take 50 mg by mouth every 4 (four) hours as needed.  Marland Kitchen UNABLE TO FIND Med Name: L Glutaliothone twice daily  . UNABLE TO FIND Med Name: immunostim twice daily  . UNABLE TO FIND Med Name:adrenal reapair twice daily  . UNABLE TO FIND Med Name: juice plus  . UNABLE TO FIND Med Name: Ultrabiotic 100 mg daily  . UNABLE TO FIND Med Name: reacted magnesium 4 x daily  . UNABLE TO FIND Med Name: big hair daily  . UNABLE TO FIND Med Name: Fibrinex daily  . valACYclovir (VALTREX) 1000 MG tablet TAKE 2 TABS AT ONSET OF COLD SORE AND REPEAT IN 12 HOURS  . Vitamin D, Ergocalciferol, (DRISDOL) 1.25 MG (50000 UT) CAPS capsule TAKE 1 CAPSULE BY MOUTH 2 TIMES A WEEK  . [DISCONTINUED] Coenzyme  Q10 (CO Q 10 PO) Take 1 capsule by mouth daily.  . [DISCONTINUED] montelukast (SINGULAIR) 10 MG tablet TAKE 1 TABLET BY MOUTH EVERYDAY AT BEDTIME  . [DISCONTINUED] Multiple Minerals (CHELATED MULTIPLE MINERAL PO) Take 1 tablet by mouth daily.  . [DISCONTINUED] omeprazole (PRILOSEC) 20 MG capsule TAKE 1 CAPSULE BY MOUTH EVERY DAY  . [DISCONTINUED] PARoxetine (PAXIL) 20 MG tablet TAKE 1 TABLET (20 MG TOTAL) BY MOUTH DAILY. NO FURTHER REFILLS WITHOUT OFFICE VISIT WITH DR. Mardelle Matte  . [DISCONTINUED] rizatriptan (MAXALT) 10 MG tablet TAKE 1 TABLET BY MOUTH DAILY AS NEEDED FOR HEADACHE  . [DISCONTINUED] UNABLE TO FIND Med Name: "T3" daily    Allergies: Patient is allergic to codeine, pregabalin, and penicillins. Family History: Patient family history includes Breast cancer in her maternal aunt, maternal grandmother, and mother; Clotting disorder in her mother; Coronary artery disease in her father; GER disease in her sister; High Cholesterol in her sister; Hyperlipidemia in her father; Hypertension in her father and sister; Leukemia in her maternal aunt; Peripheral vascular disease in her father; Stroke in her father and mother. Social History:  Patient  reports that she has never smoked. She has never used smokeless tobacco. She reports current alcohol use. She reports that she does not use drugs.  Review of Systems: Constitutional: Negative for fever malaise or anorexia Cardiovascular: negative for chest pain Respiratory: negative for SOB or persistent cough Gastrointestinal: negative for abdominal pain  Objective  Vitals: BP 110/70   Pulse (!) 103   Temp 97.8 F (36.6 C) (Temporal)   Resp 16   Ht 5\' 1"  (1.549 m)   Wt 137 lb 6.4 oz (62.3 kg)   SpO2 97%   BMI 25.96 kg/m  General: no acute distress , A&Ox3 HEENT: PEERL, conjunctiva normal, neck is supple Cardiovascular:  RRR without murmur or gallop.  Respiratory:  Good breath sounds bilaterally, CTAB with normal respiratory effort Skin:   Warm, no rashes     Commons side effects, risks, benefits, and alternatives for medications and treatment plan prescribed today were discussed, and the patient expressed understanding of the given instructions. Patient is instructed to call or message via MyChart if  he/she has any questions or concerns regarding our treatment plan. No barriers to understanding were identified. We discussed Red Flag symptoms and signs in detail. Patient expressed understanding regarding what to do in case of urgent or emergency type symptoms.   Medication list was reconciled, printed and provided to the patient in AVS. Patient instructions and summary information was reviewed with the patient as documented in the AVS. This note was prepared with assistance of Dragon voice recognition software. Occasional wrong-word or sound-a-like substitutions may have occurred due to the inherent limitations of voice recognition software  This visit occurred during the SARS-CoV-2 public health emergency.  Safety protocols were in place, including screening questions prior to the visit, additional usage of staff PPE, and extensive cleaning of exam room while observing appropriate contact time as indicated for disinfecting solutions.

## 2020-05-31 ENCOUNTER — Encounter: Payer: Self-pay | Admitting: Family Medicine

## 2020-05-31 ENCOUNTER — Other Ambulatory Visit: Payer: Self-pay | Admitting: Family Medicine

## 2020-06-01 ENCOUNTER — Other Ambulatory Visit: Payer: Self-pay

## 2020-06-01 MED ORDER — LEVOTHYROXINE SODIUM 100 MCG PO TABS: 100.0000 ug | ORAL_TABLET | Freq: Every day | ORAL | 0 refills | Status: DC

## 2020-07-07 ENCOUNTER — Other Ambulatory Visit: Payer: Self-pay | Admitting: Family Medicine

## 2020-07-07 DIAGNOSIS — G43909 Migraine, unspecified, not intractable, without status migrainosus: Secondary | ICD-10-CM

## 2020-07-10 ENCOUNTER — Telehealth: Payer: 59 | Admitting: Physician Assistant

## 2020-07-10 DIAGNOSIS — J069 Acute upper respiratory infection, unspecified: Secondary | ICD-10-CM

## 2020-07-10 DIAGNOSIS — B9689 Other specified bacterial agents as the cause of diseases classified elsewhere: Secondary | ICD-10-CM | POA: Diagnosis not present

## 2020-07-11 MED ORDER — BENZONATATE 100 MG PO CAPS: 100.0000 mg | ORAL_CAPSULE | Freq: Three times a day (TID) | ORAL | 0 refills | Status: DC | PRN

## 2020-07-11 MED ORDER — AZITHROMYCIN 250 MG PO TABS: ORAL_TABLET | ORAL | 0 refills | Status: AC

## 2020-07-11 NOTE — Progress Notes (Signed)
I have spent 5 minutes in review of e-visit questionnaire, review and updating patient chart, medical decision making and response to patient.   Ciarra Braddy Cody Yarielis Funaro, PA-C    

## 2020-07-11 NOTE — Progress Notes (Signed)

## 2020-09-04 ENCOUNTER — Other Ambulatory Visit: Payer: Self-pay

## 2020-09-04 ENCOUNTER — Ambulatory Visit (INDEPENDENT_AMBULATORY_CARE_PROVIDER_SITE_OTHER): Payer: 59 | Admitting: Family Medicine

## 2020-09-04 VITALS — BP 138/80 | HR 141 | Temp 97.9°F | Resp 20 | Ht 61.0 in | Wt 148.0 lb

## 2020-09-04 DIAGNOSIS — E039 Hypothyroidism, unspecified: Secondary | ICD-10-CM

## 2020-09-04 DIAGNOSIS — Z0000 Encounter for general adult medical examination without abnormal findings: Secondary | ICD-10-CM | POA: Diagnosis not present

## 2020-09-04 DIAGNOSIS — Z23 Encounter for immunization: Secondary | ICD-10-CM | POA: Diagnosis not present

## 2020-09-04 DIAGNOSIS — E782 Mixed hyperlipidemia: Secondary | ICD-10-CM

## 2020-09-04 DIAGNOSIS — M797 Fibromyalgia: Secondary | ICD-10-CM | POA: Diagnosis not present

## 2020-09-04 DIAGNOSIS — F411 Generalized anxiety disorder: Secondary | ICD-10-CM

## 2020-09-04 DIAGNOSIS — Z1212 Encounter for screening for malignant neoplasm of rectum: Secondary | ICD-10-CM | POA: Diagnosis not present

## 2020-09-04 DIAGNOSIS — Z1211 Encounter for screening for malignant neoplasm of colon: Secondary | ICD-10-CM

## 2020-09-04 DIAGNOSIS — Z803 Family history of malignant neoplasm of breast: Secondary | ICD-10-CM

## 2020-09-04 DIAGNOSIS — G894 Chronic pain syndrome: Secondary | ICD-10-CM

## 2020-09-04 DIAGNOSIS — F1120 Opioid dependence, uncomplicated: Secondary | ICD-10-CM

## 2020-09-04 DIAGNOSIS — R058 Other specified cough: Secondary | ICD-10-CM

## 2020-09-04 LAB — COMPREHENSIVE METABOLIC PANEL
ALT: 15 U/L (ref 0–35)
AST: 17 U/L (ref 0–37)
Albumin: 4.8 g/dL (ref 3.5–5.2)
Alkaline Phosphatase: 80 U/L (ref 39–117)
BUN: 15 mg/dL (ref 6–23)
CO2: 22 mEq/L (ref 19–32)
Calcium: 10.2 mg/dL (ref 8.4–10.5)
Chloride: 105 mEq/L (ref 96–112)
Creatinine, Ser: 1.01 mg/dL (ref 0.40–1.20)
GFR: 59.21 mL/min — ABNORMAL LOW (ref >60.00)
Glucose, Bld: 114 mg/dL — ABNORMAL HIGH (ref 70–99)
Potassium: 3.8 mEq/L (ref 3.5–5.1)
Sodium: 141 mEq/L (ref 135–145)
Total Bilirubin: 0.7 mg/dL (ref 0.2–1.2)
Total Protein: 8 g/dL (ref 6.0–8.3)

## 2020-09-04 LAB — VITAMIN B12: Vitamin B-12: 251 pg/mL (ref 211–911)

## 2020-09-04 LAB — TSH: TSH: 0.24 u[IU]/mL — ABNORMAL LOW (ref 0.35–5.50)

## 2020-09-04 LAB — PHOSPHORUS: Phosphorus: 3.2 mg/dL (ref 2.3–4.6)

## 2020-09-04 LAB — MAGNESIUM: Magnesium: 2 mg/dL (ref 1.5–2.5)

## 2020-09-04 MED ORDER — MONTELUKAST SODIUM 10 MG PO TABS: 10.0000 mg | ORAL_TABLET | Freq: Every day | ORAL | 3 refills | Status: DC

## 2020-09-04 MED ORDER — BENZONATATE 100 MG PO CAPS: 100.0000 mg | ORAL_CAPSULE | Freq: Three times a day (TID) | ORAL | 0 refills | Status: DC | PRN

## 2020-09-04 MED ORDER — PREDNISONE 10 MG PO TABS: ORAL_TABLET | ORAL | 0 refills | Status: DC

## 2020-09-04 NOTE — Patient Instructions (Addendum)
Please return in 3 months for recheck thyroid and heart rate. Decrease caffeine use.   I will release your lab results to you on your MyChart account with further instructions. Please reply with any questions.    Please schedule appointment with Dr. Melvyn Novas and Dr. Toy Care.  I recommend the [facility] test for your colon cancer screening that is due. I have ordered this test for you. The [facility] will soon contact you to verify your insurance, address etc. They will then send you the kit; follow the instructions in the kit and return the kit to [facility]. They will run the test and send the results to me. I will then give you the results. If this test is negative, we recommend repeating a colon cancer screening test in 3 years. If it is positive, I will refer you to a Gastroenterologist so you can get set up for the recommended colonoscopy.  Thank you!   Please schedule your mammogram!  If you have any questions or concerns, please don't hesitate to send me a message via MyChart or call the office at [phone]. Thank you for visiting with [place] today! It's our pleasure caring for you.

## 2020-09-04 NOTE — Progress Notes (Signed)
Subjective  Chief Complaint  Patient presents with   Annual Exam    HPI: Claudia Luna is a 65 y.o. female who presents to [facility] [place] today for a Female Wellness Visit. She also has the concerns and/or needs as listed above in the chief complaint. These will be addressed in addition to the Health Maintenance Visit.   Wellness Visit: annual visit with health maintenance review and exam without Pap  Health maintenance: Patient overdue for colorectal cancer screening.  Last colonoscopy was 2010.  Reports normal.  No family history of colon cancer.  No personal history of polyps.  Also overdue for mammogram.  She has canceled appointments.  Has felt too tired because of fibromyalgia.  She has a strong family history of breast cancer. She is due for her second Shingrix dose today.  Other immunizations are up-to-date. Lifestyle: Unfortunately she is very inactive due to her chronic fatigue, fibromyalgia pain.  She inquires about integrative health specialist. Chronic disease f/u and/or acute problem visit: (deemed necessary to be done in addition to the wellness visit): Hypothyroidism: We increased her dose of thyroid replacement back in April.  She is now due for a recheck.  She denies symptoms of hyperthyroidism.  She reports her compliance is good. Upper airway cough syndrome: Has had several visits for upper respiratory tract infection/bronchitis.  She carries a chronic history of upper airway cough syndrome managed by pulmonology.  She has not seen Dr. Sherene Sires in a year or 2.  However she reports that since COVID, back in December her cough has worsened.  She continues on Symbicort 80/4.5 twice daily.  She uses [employer] as needed and requests refills.  She has no new fevers or chills.  No shortness of breath.  She tends not to use albuterol because is not helpful and makes her shaky.  She reports she denies palpitations, she has chronic tremor and feels more  shaky when she is coughing harshly.  No chest pain.  No pleuritic chest pain.  Hyperlipidemia: Tolerates her statin well.  Labs reviewed from April.  LDL at goal. Chronic pain syndrome per PMR.  On chronic narcotics. She reports history of excessive fatigue treated by Dr. Evelene Croon, psychiatry with Adderall.  She feels this helped significantly.  She carries a diagnosis of chronic anxiety.  She states her anxiety is fairly well controlled with Paxil.  Assessment  1. Annual physical exam   2. Hypothyroidism (acquired)   3. Mixed hyperlipidemia   4. Fibromyalgia, primary   5. Chronic pain syndrome   6. GAD (generalized anxiety disorder)   7. Upper airway cough syndrome   8. Chronic narcotic dependence (HCC)   9. Family history of breast cancer in first degree relative   10. Screening for colorectal cancer   11. Need for shingles vaccine      Plan  Female Wellness Visit: Age appropriate Health Maintenance and Prevention measures were discussed with patient. Included topics are cancer screening recommendations, ways to keep healthy (see AVS) including dietary and exercise recommendations, regular eye and dental care, use of seat belts, and avoidance of moderate alcohol use and tobacco use.  Educated importance of mammogram.  Patient to schedule.  She elects Cologuard for colorectal cancer screening.  Ordered. BMI: discussed patient's BMI and encouraged positive lifestyle modifications to help get to or maintain a target BMI. HM needs and immunizations were addressed and ordered. See below for orders. See HM and immunization section for updates.  Second  Shingrix dose given today. Routine labs and screening tests ordered including cmp, cbc and lipids where appropriate. Discussed recommendations regarding Vit D and calcium supplementation (see AVS)  Chronic disease management visit and/or acute problem visit: Hypothyroidism: Recheck today.  Adjust dose as needed.  Patient is tachycardic today but  this did improve with slow gentle breathing.  Was exacerbated with coughing spells.  Rhythm was normal sinus tach Hyperlipidemia is well controlled Anxiety disorder with history of fatigue and fibromyalgia: Very active.  Recommend follow-up with Dr. Evelene Croon Pain management managing chronic pain. Active coughing: Upper airway cough syndrome: Prednisone taper.  Continue Symbicort.  Refill Tessalon Perles.  Recommend follow-up visit with pulmonology.  Patient to schedule. Patient is interested in consulting with [facility] [place].  She was on multiple vitamins in the past from her specialist in [place] which she no longer goes to.  Follow up: 3 months for recheck. Orders Placed This Encounter  Procedures   Varicella-zoster vaccine IM   TSH   Comprehensive metabolic panel   Magnesium   Vitamin B12   Phosphorus   Cologuard   Meds ordered this encounter  Medications   benzonatate (TESSALON) 100 MG capsule    Sig: Take 1 capsule (100 mg total) by mouth 3 (three) times daily as needed for cough.    Dispense:  60 capsule    Refill:  0   montelukast (SINGULAIR) 10 MG tablet    Sig: Take 1 tablet (10 mg total) by mouth at bedtime.    Dispense:  90 tablet    Refill:  3   predniSONE (DELTASONE) 10 MG tablet    Sig: Take 4 tabs qd x 2 days, 3 qd x 2 days, 2 qd x 2d, 1qd x 3 days    Dispense:  21 tablet    Refill:  0      Body mass index is 27.96 kg/m. Wt Readings from Last 3 Encounters:  09/04/20 148 lb (67.1 kg)  05/29/20 137 lb 6.4 oz (62.3 kg)  05/20/19 170 lb (77.1 kg)     Patient Active Problem List   Diagnosis Date Noted   Chronic narcotic dependence (HCC) 02/17/2017    Priority: High   Family history of breast cancer in first degree relative 02/17/2017    Priority: High    Mother, maternal aunt and grandmother     Fibromyalgia, primary 12/24/2016    Priority: High    Dxd in 1992; with chronic fatigue. Paxil for anxiety and fibro; has  used lyrica in the past but stopped due to SE - swelling. Extremely limiting problem for patient.      GAD (generalized anxiety disorder) 12/24/2016    Priority: High    Treated wit Paxil     Chronic pain syndrome 12/24/2016    Priority: High    Mainly due to fibromyalgia. Managed by chronic pain specialist     Mixed hyperlipidemia 01/19/2007    Priority: High   Hypothyroidism (acquired) 09/01/2006    Priority: High   OA (osteoarthritis) of knee 07/24/2017    Priority: Medium   History of herniated intervertebral disc 12/24/2016    Priority: Medium   Migraine headache without aura 12/06/2015    Priority: Medium    Formerly managed by Neurologist. Dr. Catalina Lunger.  Has had botox injections. Uses maxalt/twice per week.     Upper airway cough syndrome 04/13/2012    Priority: Medium    Followed in Pulmonary clinic/ [facility]/ [facility] 10/2011 - Sinus  CT 06/04/12 Minimal maxillary mucoperiosteal thickening. -methacholine challenge 01/06/13 causing cough but only drop in fef 2575  - neurontin rx 12/18/2012 > increased to 300 tid 01/11/13 plus added singulair - Allergy profile 05/20/2019 >  Eos 184 /  IgE less tahn 2  - trial off symbicort 05/20/2019 >  resume if cough flares on singulair        Reactive airway disease that is not asthma 12/29/2007    Priority: Medium    See methacholine 01/06/13 > caused cough and drop in fef25-75 > trial of singulair rec 01/11/13  -02/10/2019  After extensive coaching inhaler device,  effectiveness =    75% > continue symb 80 2bid but reduce saba to prn      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    Priority: Medium    Sleep study - negative for apnea 2018; related to husband's snoring and anxiety.     Essential tremor 02/17/2017    Priority: Low   Vertigo 12/24/2016    Priority: Low    H/o recalcitrant BPPV; now recurs with sinus infections. 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 10/1 IMO update     Health Maintenance  Topic Date Due   Fecal DNA (Cologuard)  Never done   MAMMOGRAM  03/06/2017   INFLUENZA VACCINE  09/03/2020   COVID-19 Vaccine (5 - Booster for Pfizer series) 09/28/2020   Pneumococcal Vaccine 600-64 Years old (3 - PCV) 01/07/2021   TETANUS/TDAP  02/04/2023   Hepatitis C Screening  Completed   HIV Screening  Completed   Zoster Vaccines- Shingrix  Completed   HPV VACCINES  Aged Out   Immunization History  Administered Date(s) Administered   DTaP 11/05/2008   Hep A / Hep B 03/31/2010, 09/23/2010, 10/21/2010   Influenza Whole 02/02/2004, 12/02/2006, 12/28/2007   Influenza,inj,Quad PF,6+ Mos 12/02/2012, 01/31/2015, 11/08/2015, 12/24/2016, 01/11/2018, 10/29/2018, 01/08/2020   Influenza,inj,quad, With Preservative 02/03/2017   Influenza-Unspecified 11/30/2013   PFIZER Comirnaty(Gray Top)Covid-19 Tri-Sucrose Vaccine 05/29/2020   PFIZER(Purple Top)SARS-COV-2 Vaccination 04/28/2019, 05/25/2019, 11/29/2019   Pneumococcal Polysaccharide-23 12/28/2007, 01/08/2020   Tdap 02/03/2013   Zoster Recombinat (Shingrix) 05/29/2020, 09/04/2020   Zoster, Live 10/27/2011   We updated and reviewed the patient's past history in detail and it is documented below. Allergies: Patient is allergic to codeine, pregabalin, and penicillins. Past Medical History Patient  has a past medical history of Anxiety, Chronic kidney disease, Depression, Essential tremor (02/17/2017), Family history of breast cancer in first degree relative (02/17/2017), Fibromyalgia, Fibromyalgia, primary (12/24/2016), GAD (generalized anxiety disorder) (12/24/2016), Gout, Headache(784.0), History of herniated intervertebral disc (12/24/2016), Hyperlipidemia, Osteoarthritis of left knee, PONV (postoperative  nausea and vomiting), Reactive airway disease, Recurrent cold sores (12/24/2016), Thyroid disease, Tinnitus, and Vertigo (12/24/2016). Past Surgical History Patient  has a past surgical history that includes Breast surgery (1993); Knee arthroscopy; Appendectomy; Cholecystectomy; Splenic artery embolization (02/2001); Abdominal hysterectomy; crown replaced ; and Total knee arthroplasty (Left, 02/22/2018). Family History: Patient family history includes Breast cancer in her maternal aunt, maternal grandmother, and mother; Clotting disorder in her mother; Coronary artery disease in her father; GER disease in her sister; High Cholesterol in her sister; Hyperlipidemia in her father; Hypertension in her father and sister; Leukemia in her maternal aunt; Peripheral  vascular disease in her father; Stroke in her father and mother. Social History:  Patient  reports that she has never smoked. She has never used smokeless tobacco. She reports current alcohol use. She reports that she does not use drugs.  Review of Systems: Constitutional: negative for fever or malaise Ophthalmic: negative for photophobia, double vision or loss of vision Cardiovascular: negative for chest pain, dyspnea on exertion, or new LE swelling Respiratory: negative for SOB or persistent cough Gastrointestinal: negative for abdominal pain, change in bowel habits or melena Genitourinary: negative for dysuria or gross hematuria, no abnormal uterine bleeding or disharge Musculoskeletal: negative for new gait disturbance or muscular weakness Integumentary: negative for new or persistent rashes, no breast lumps Neurological: negative for TIA or stroke symptoms Psychiatric: negative for SI or delusions Allergic/Immunologic: negative for hives  Patient Care Team    Relationship Specialty Notifications Start End  Willow Ora, MD PCP - General Family Medicine  12/24/16   Verdon Cummins, MD Referring Physician Physical Medicine and  Rehabilitation  12/24/16   Noland Fordyce, MD Consulting Physician Obstetrics and Gynecology  12/24/16   Etter Sjogren, MD Consulting Physician Plastic Surgery  12/24/16   Ollen Gross, MD Consulting Physician Orthopedic Surgery  12/24/16   Annia Belt, MD Consulting Physician Neurology  12/24/16   Duke Salvia, MD Consulting Physician Cardiology  10/30/18     Objective  Vitals: BP 138/80   Pulse (!) 141   Temp 97.9 F (36.6 C) (Temporal)   Resp 20   Ht 5\' 1"  (1.549 m)   Wt 148 lb (67.1 kg)   BMI 27.96 kg/m  General:  Well developed, well nourished, no acute distress but coughing frequently, sometimes harshly. Psych:  Alert and orientedx3,normal mood and affect HEENT:  Normocephalic, atraumatic, non-icteric sclera,  supple neck without adenopathy, mass or thyromegaly Cardiovascular:  Normal S1, S2, sinus tach without gallop, rub or murmur Respiratory:  Good breath sounds bilaterally, CTAB with normal respiratory effort, increased coughing with deep breathing.  No wheezing or rales or rhonchi Gastrointestinal: normal bowel sounds, soft, non-tender, no noted masses. No HSM MSK: no deformities, contusions. Joints are without erythema or swelling.  Skin:  Warm, no rashes or suspicious lesions noted Neurologic:    Mental status is normal. CN 2-11 are normal. Gross motor and sensory exams are normal. Normal gait. No tremor   Commons side effects, risks, benefits, and alternatives for medications and treatment plan prescribed today were discussed, and the patient expressed understanding of the given instructions. Patient is instructed to call or message via MyChart if he/she has any questions or concerns regarding our treatment plan. No barriers to understanding were identified. We discussed Red Flag symptoms and signs in detail. Patient expressed understanding regarding what to do in case of urgent or emergency type symptoms.  Medication list was reconciled, printed and provided to  the patient in AVS. Patient instructions and summary information was reviewed with the patient as documented in the AVS. This note was prepared with assistance of Dragon voice recognition software. Occasional wrong-word or sound-a-like substitutions may have occurred due to the inherent limitations of voice recognition software  This visit occurred during the SARS-CoV-2 public health emergency.  Safety protocols were in place, including screening questions prior to the visit, additional usage of staff PPE, and extensive cleaning of exam room while observing appropriate contact time as indicated for disinfecting solutions.

## 2020-09-06 ENCOUNTER — Other Ambulatory Visit: Payer: Self-pay | Admitting: Family Medicine

## 2020-09-06 ENCOUNTER — Encounter: Payer: Self-pay | Admitting: Family Medicine

## 2020-09-07 ENCOUNTER — Telehealth: Payer: Self-pay

## 2020-09-07 NOTE — Telephone Encounter (Signed)
Husband called back to check the status of the prescription

## 2020-09-07 NOTE — Telephone Encounter (Signed)
Pt's husband called regarding a prescription from [facility]. He stated that it was approved but there are some issues and a nurse needs to call at [phone]

## 2020-09-10 ENCOUNTER — Other Ambulatory Visit: Payer: Self-pay | Admitting: Family Medicine

## 2020-09-10 NOTE — Telephone Encounter (Signed)
LMOVM that I need to know the medication that this is in reference to.

## 2020-09-14 ENCOUNTER — Other Ambulatory Visit: Payer: Self-pay | Admitting: Family Medicine

## 2020-09-14 ENCOUNTER — Telehealth: Payer: Self-pay | Admitting: *Deleted

## 2020-09-14 ENCOUNTER — Other Ambulatory Visit: Payer: Self-pay | Admitting: *Deleted

## 2020-09-14 MED ORDER — LEVOTHYROXINE SODIUM 88 MCG PO TABS: ORAL_TABLET | ORAL | 0 refills | Status: DC

## 2020-09-14 MED ORDER — AZITHROMYCIN 250 MG PO TABS: ORAL_TABLET | ORAL | 0 refills | Status: AC

## 2020-09-14 NOTE — Telephone Encounter (Signed)
See result notes. 

## 2020-09-14 NOTE — Telephone Encounter (Signed)
Dr. Mardelle Matte, spoke to pt and she said she is not feeling any better. Is on the last day of her prednisone that you prescribed. Pt is running low grade fever 98.9 to 99.0 and has non-productive cough and slight SOB. Pt said she discussed at her last visit with you. Pt would like and antibiotic prescribed. Please advise.

## 2020-09-14 NOTE — Telephone Encounter (Signed)
Spoke to pt told her sending in Zpak to the pharmacy and Dr. Mardelle Matte wants you to schedule an appt with Dr. Sherene Sires for your cough. Pt verbalized understanding. Rx sent.

## 2020-09-14 NOTE — Addendum Note (Signed)
Addended by: Jimmye Norman on: 09/14/2020 10:50 AM   Modules accepted: Orders

## 2020-09-20 ENCOUNTER — Encounter: Payer: Self-pay | Admitting: Family Medicine

## 2020-09-21 ENCOUNTER — Other Ambulatory Visit: Payer: Self-pay

## 2020-09-21 MED ORDER — LEVOTHYROXINE SODIUM 100 MCG PO TABS: 100.0000 ug | ORAL_TABLET | Freq: Every day | ORAL | 0 refills | Status: DC

## 2020-10-05 ENCOUNTER — Telehealth: Payer: Self-pay | Admitting: Internal Medicine

## 2020-10-05 NOTE — Telephone Encounter (Signed)
Called pt again, still no answer- LMTCB 

## 2020-10-05 NOTE — Telephone Encounter (Signed)
ATC, left VM. 

## 2020-10-05 NOTE — Telephone Encounter (Signed)
Pt sent a message via MyChart stating;  "Upper respiratory infection continues after completion of antibiotic regiment"  Pt was last seen by Dr. Sherene Sires in 05/2019 and is overdue for a follow up appointment with him.  Pls regard; [phone]

## 2020-10-05 NOTE — Telephone Encounter (Signed)
ATC x3, no answer, LVM to return call on Tuesday of next week as the phones are now off and we will be closed until Tuesday.  I was going to attempt another #, however, she only has one number listed, [phone].

## 2020-10-09 ENCOUNTER — Encounter: Payer: Self-pay | Admitting: *Deleted

## 2020-10-09 NOTE — Telephone Encounter (Signed)
LMTCB again and mailed letter Closing per protocol

## 2020-10-14 ENCOUNTER — Other Ambulatory Visit: Payer: Self-pay | Admitting: Family Medicine

## 2020-11-06 ENCOUNTER — Other Ambulatory Visit: Payer: Self-pay | Admitting: Family Medicine

## 2020-11-29 ENCOUNTER — Other Ambulatory Visit: Payer: Self-pay | Admitting: Family Medicine

## 2020-12-06 ENCOUNTER — Encounter: Payer: Self-pay | Admitting: Family Medicine

## 2020-12-06 ENCOUNTER — Ambulatory Visit: Payer: 59 | Admitting: Family Medicine

## 2021-01-08 ENCOUNTER — Ambulatory Visit: Payer: 59 | Admitting: Family Medicine

## 2021-01-08 ENCOUNTER — Other Ambulatory Visit: Payer: Self-pay

## 2021-01-08 ENCOUNTER — Encounter: Payer: Self-pay | Admitting: Family Medicine

## 2021-01-08 VITALS — BP 122/90 | HR 84 | Temp 97.5°F | Ht 61.0 in | Wt 161.6 lb

## 2021-01-08 DIAGNOSIS — E039 Hypothyroidism, unspecified: Secondary | ICD-10-CM | POA: Diagnosis not present

## 2021-01-08 DIAGNOSIS — Z23 Encounter for immunization: Secondary | ICD-10-CM | POA: Diagnosis not present

## 2021-01-08 DIAGNOSIS — M797 Fibromyalgia: Secondary | ICD-10-CM | POA: Diagnosis not present

## 2021-01-08 DIAGNOSIS — Z1211 Encounter for screening for malignant neoplasm of colon: Secondary | ICD-10-CM

## 2021-01-08 DIAGNOSIS — G894 Chronic pain syndrome: Secondary | ICD-10-CM | POA: Diagnosis not present

## 2021-01-08 DIAGNOSIS — F5101 Primary insomnia: Secondary | ICD-10-CM

## 2021-01-08 DIAGNOSIS — Z1212 Encounter for screening for malignant neoplasm of rectum: Secondary | ICD-10-CM

## 2021-01-08 DIAGNOSIS — Z1231 Encounter for screening mammogram for malignant neoplasm of breast: Secondary | ICD-10-CM

## 2021-01-08 LAB — TSH: TSH: 1.19 u[IU]/mL (ref 0.35–5.50)

## 2021-01-08 MED ORDER — GABAPENTIN 300 MG PO CAPS: 300.0000 mg | ORAL_CAPSULE | Freq: Two times a day (BID) | ORAL | Status: DC

## 2021-01-08 MED ORDER — LEVOTHYROXINE SODIUM 100 MCG PO TABS: ORAL_TABLET | ORAL | 2 refills | Status: DC

## 2021-01-08 NOTE — Progress Notes (Signed)
Subjective  CC:  Chief Complaint  Patient presents with   Hypothyroidism    HPI: Claudia Luna is a 65 y.o. female who presents to the office today to address the problems listed above in the chief complaint.  Hypothyroidism f/u: Claudia Luna is a 65 y.o. female who presents for follow up of hypothyroidism. Last TSH showed control was unsatisfactory, and thyroid supplement medication was adjusted accordingly.  Current symptoms: none . Patient denies change in energy level, diarrhea, heat / cold intolerance, nervousness, and weight changes. .She has been compliant with the medication. Due for lab recheck.  Lab Results  Component Value Date   TSH 0.24 (L) 09/04/2020   Taking 100 M-f and 88 on sundays Assessment  1. Hypothyroidism (acquired)      Plan  Hypothyroidism:  recheck labs today and adjust meds accordingly. Pt is clinically stable.  Follow up: 6 mo for recheck  Visit date not found  No orders of the defined types were placed in this encounter.  No orders of the defined types were placed in this encounter.     I reviewed the patients updated PMH, FH, and SocHx.    Patient Active Problem List   Diagnosis Date Noted   Chronic narcotic dependence (HCC) 02/17/2017    Priority: High   Family history of breast cancer in first degree relative 02/17/2017    Priority: High   Fibromyalgia, primary 12/24/2016    Priority: High   GAD (generalized anxiety disorder) 12/24/2016    Priority: High   Chronic pain syndrome 12/24/2016    Priority: High   Mixed hyperlipidemia 01/19/2007    Priority: High   Hypothyroidism (acquired) 09/01/2006    Priority: High   OA (osteoarthritis) of knee 07/24/2017    Priority: Medium    History of herniated intervertebral disc 12/24/2016    Priority: Medium    Migraine headache without aura 12/06/2015    Priority: Medium    Upper airway cough syndrome 04/13/2012    Priority: Medium    Reactive airway disease that is not  asthma 12/29/2007    Priority: Medium    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    Priority: Medium    Essential tremor 02/17/2017    Priority: Low   Vertigo 12/24/2016    Priority: Low   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   Cyanocobalamin (B-12 PO) Take 1 tablet by mouth daily.   gabapentin (NEURONTIN) 300 MG capsule Take 1 capsule (300 mg total) by mouth 3 (three) times daily. (Patient taking differently: Take 300 mg by mouth 2 (two) times daily.)   levothyroxine (SYNTHROID) 100 MCG tablet TAKE 1 TABLET BY MOUTH EVERY DAY (Patient taking differently: Monday through saturday)   levothyroxine (SYNTHROID) 88 MCG tablet PLEASE TAKE ONE TABLET EVERY SUNDAY.   montelukast (SINGULAIR) 10 MG tablet Take 1 tablet (10 mg total) by mouth at bedtime.   omeprazole (PRILOSEC) 20 MG capsule Take 1 capsule (20 mg total) by mouth daily.   ondansetron (ZOFRAN) 4 MG tablet TAKE 1 TABLET BY MOUTH  EVERY 8 HOURS AS NEEDED FOR NAUSEA AND VOMITING   PARoxetine (PAXIL) 20 MG tablet TAKE 1 TABLET BY MOUTH EVERY DAY   predniSONE (DELTASONE) 10 MG tablet Take 4 tabs qd x 2 days, 3 qd x 2 days, 2 qd x 2d, 1qd x 3 days   rizatriptan (MAXALT) 10 MG tablet TAKE 1 TABLET BY MOUTH DAILY AS NEEDED FOR HEADACHE.   rosuvastatin (CRESTOR) 10 MG tablet TAKE 1 TABLET BY MOUTH EVERYDAY AT BEDTIME   tapentadol (NUCYNTA) 50 MG tablet Take 50 mg by mouth every 4 (four) hours as needed.   valACYclovir (VALTREX) 1000 MG tablet TAKE 2 TABS AT ONSET OF COLD SORE AND REPEAT IN 12 HOURS     Allergies: Patient is allergic to codeine, pregabalin, and penicillins. Family History: Patient family history includes Breast cancer in her maternal aunt, maternal grandmother, and mother; Clotting disorder in her mother; Coronary artery disease in her father; GER disease in her sister; High Cholesterol in her sister; Hyperlipidemia in her father; Hypertension in her father and sister; Leukemia in her maternal aunt; Peripheral vascular disease in her father; Stroke in her father and mother. Social History:  Patient  reports that she has never smoked. She has never used smokeless tobacco. She reports current alcohol use. She reports that she does not use drugs.  Review of Systems: Constitutional: Negative for fever malaise or anorexia Cardiovascular: negative for chest pain Respiratory: negative for SOB or persistent cough Gastrointestinal: negative for abdominal pain  Objective  Vitals: BP 122/90   Pulse 84   Temp (!) 97.5 F (36.4 C) (Temporal)   Ht 5\' 1"  (1.549 m)   Wt 161 lb 9.6 oz (73.3 kg)   SpO2 99%   BMI 30.53 kg/m  General: no acute distress , A&Ox3 HEENT: PEERL, no proptosis or lid lag, conjunctiva normal, Oropharynx moist,neck is supple without goiter or thyromegaly or thyroid nodules Cardiovascular:  RRR without murmur or gallop. No peripheral edema Respiratory:  Good breath sounds bilaterally, CTAB with normal respiratory effort Skin:  Warm, no rashes Neuro:slight tremor    Commons side effects, risks, benefits, and alternatives for medications and treatment plan prescribed today were discussed, and the patient expressed understanding of the given instructions. Patient is instructed to call or message via MyChart if he/she has any questions or concerns regarding our treatment plan. No barriers to understanding were identified. We discussed Red Flag symptoms and signs in detail. Patient expressed understanding regarding what to do in case of urgent or emergency type  symptoms.  Medication list was reconciled, printed and provided to the patient in AVS. Patient instructions and summary information was reviewed with the patient as documented in the AVS. This note was prepared with assistance of Dragon voice recognition software. Occasional wrong-word or sound-a-like substitutions may have occurred due to the inherent limitations of voice recognition software

## 2021-01-08 NOTE — Patient Instructions (Addendum)
Please return in 3 months for recheck.   I will release your lab results to you on your MyChart account with further instructions. Please reply with any questions.    Today you were given your flu vaccination.   If you have any questions or concerns, please don't hesitate to send me a message via MyChart or call the office at [phone]. Thank you for visiting with [place] today! It's our pleasure caring for you.   I recommend the Cologuard test for your colon cancer screening that is due. I have ordered this test for you. [facility] will soon contact you to verify your insurance, address etc. They will then send you the kit; follow the instructions in the kit and return the kit to [facility]. They will run the test and send the results to me. I will then give you the results. If this test is negative, we recommend repeating a colon cancer screening test in 3 years. If it is positive, I will refer you to a Gastroenterologist so you can get set up for the recommended colonoscopy.  Thank you!   I have ordered a mammogram and/or bone density for you as we discussed today: $RemoveBeforeDE'[x]'[id number]$   Mammogram  $RemoveBe'[]'[id number]$   Bone Density  Please call the office checked below to schedule your appointment:  $RemoveBefor'[x]'[id number]$   [facility]      [address] [place], [place]         '[]'$   [facility]  [address] [place], [place]

## 2021-01-24 ENCOUNTER — Other Ambulatory Visit: Payer: Self-pay

## 2021-01-24 DIAGNOSIS — Z1211 Encounter for screening for malignant neoplasm of colon: Secondary | ICD-10-CM

## 2021-04-08 ENCOUNTER — Other Ambulatory Visit: Payer: Self-pay | Admitting: Family Medicine

## 2021-04-08 DIAGNOSIS — G43909 Migraine, unspecified, not intractable, without status migrainosus: Secondary | ICD-10-CM

## 2021-05-01 ENCOUNTER — Other Ambulatory Visit (HOSPITAL_COMMUNITY): Payer: Self-pay | Admitting: Cardiovascular Disease

## 2021-05-01 ENCOUNTER — Encounter (HOSPITAL_COMMUNITY): Payer: Self-pay

## 2021-05-01 ENCOUNTER — Other Ambulatory Visit (HOSPITAL_COMMUNITY): Payer: Self-pay | Admitting: *Deleted

## 2021-05-01 ENCOUNTER — Other Ambulatory Visit: Payer: Self-pay | Admitting: Cardiovascular Disease

## 2021-05-01 ENCOUNTER — Telehealth (HOSPITAL_COMMUNITY): Payer: Self-pay | Admitting: *Deleted

## 2021-05-01 DIAGNOSIS — R0789 Other chest pain: Secondary | ICD-10-CM

## 2021-05-01 MED ORDER — IVABRADINE HCL 7.5 MG PO TABS
ORAL_TABLET | ORAL | 0 refills | Status: DC
Start: 2021-05-01 — End: 2022-03-24

## 2021-05-01 NOTE — Telephone Encounter (Signed)
Reaching out to patient to offer assistance regarding upcoming cardiac imaging study; pt verbalizes understanding of appt date/time, parking situation and where to check in, pre-test NPO status and medications ordered, and verified current allergies; name and call back number provided for further questions should they arise ? ?Larey Brick RN Navigator Cardiac Imaging ?[facility] ?[phone] office ?[phone] cell ? ?Patient to take 15mg  ivabradine two hours prior to her cardiac CT scan.  She is aware to arrive at 10:30am for her 11am scan. ?

## 2021-05-03 ENCOUNTER — Ambulatory Visit (HOSPITAL_COMMUNITY)
Admission: RE | Admit: 2021-05-03 | Discharge: 2021-05-03 | Disposition: A | Discharge status: Home / Self Care | Payer: 59 | Source: Ambulatory Visit | Attending: Cardiology | Admitting: Cardiology

## 2021-05-03 DIAGNOSIS — R0789 Other chest pain: Secondary | ICD-10-CM | POA: Diagnosis not present

## 2021-05-03 DIAGNOSIS — I2510 Atherosclerotic heart disease of native coronary artery without angina pectoris: Secondary | ICD-10-CM

## 2021-05-03 MED ORDER — DILTIAZEM HCL 25 MG/5ML IV SOLN
10.0000 mg | INTRAVENOUS | Status: DC | PRN
  Administered 2021-05-03: 5 mg via INTRAVENOUS

## 2021-05-03 MED ORDER — NITROGLYCERIN 0.4 MG SL SUBL
0.8000 mg | SUBLINGUAL_TABLET | Freq: Once | SUBLINGUAL | Status: AC
  Administered 2021-05-03: 0.8 mg via SUBLINGUAL

## 2021-05-03 MED ORDER — NITROGLYCERIN 0.4 MG SL SUBL
SUBLINGUAL_TABLET | SUBLINGUAL | Status: AC
  Filled 2021-05-03: qty 2

## 2021-05-03 MED ORDER — IOHEXOL 350 MG/ML SOLN
100.0000 mL | Freq: Once | INTRAVENOUS | Status: AC | PRN
  Administered 2021-05-03: 100 mL via INTRAVENOUS

## 2021-05-03 MED ORDER — DILTIAZEM HCL 25 MG/5ML IV SOLN
INTRAVENOUS | Status: AC
  Filled 2021-05-03: qty 5

## 2021-05-03 MED ORDER — SODIUM CHLORIDE 0.9 % IV BOLUS
500.0000 mL | Freq: Once | INTRAVENOUS | Status: AC
  Administered 2021-05-03: 500 mL via INTRAVENOUS

## 2021-05-11 ENCOUNTER — Other Ambulatory Visit: Payer: Self-pay | Admitting: Family Medicine

## 2021-05-11 DIAGNOSIS — G43909 Migraine, unspecified, not intractable, without status migrainosus: Secondary | ICD-10-CM

## 2021-06-09 ENCOUNTER — Other Ambulatory Visit: Payer: Self-pay | Admitting: Family Medicine

## 2021-06-10 ENCOUNTER — Other Ambulatory Visit: Payer: Self-pay | Admitting: Family Medicine

## 2021-06-10 DIAGNOSIS — G43909 Migraine, unspecified, not intractable, without status migrainosus: Secondary | ICD-10-CM

## 2021-07-06 ENCOUNTER — Other Ambulatory Visit: Payer: Self-pay | Admitting: Family Medicine

## 2021-07-11 ENCOUNTER — Other Ambulatory Visit: Payer: Self-pay | Admitting: Family Medicine

## 2021-07-11 DIAGNOSIS — G43909 Migraine, unspecified, not intractable, without status migrainosus: Secondary | ICD-10-CM

## 2021-08-06 ENCOUNTER — Other Ambulatory Visit: Payer: Self-pay | Admitting: Family Medicine

## 2021-09-08 ENCOUNTER — Other Ambulatory Visit: Payer: Self-pay | Admitting: Family Medicine

## 2021-09-12 ENCOUNTER — Other Ambulatory Visit: Payer: Self-pay | Admitting: Family Medicine

## 2021-09-12 DIAGNOSIS — R058 Other specified cough: Secondary | ICD-10-CM

## 2021-09-12 DIAGNOSIS — Z0000 Encounter for general adult medical examination without abnormal findings: Secondary | ICD-10-CM

## 2021-09-13 ENCOUNTER — Other Ambulatory Visit: Payer: Self-pay | Admitting: Family Medicine

## 2021-09-18 ENCOUNTER — Encounter: Payer: Self-pay | Admitting: Family Medicine

## 2021-09-18 ENCOUNTER — Ambulatory Visit: Payer: 59 | Admitting: Family Medicine

## 2021-09-18 VITALS — BP 108/78 | HR 105 | Temp 98.1°F | Ht 61.0 in | Wt 158.0 lb

## 2021-09-18 DIAGNOSIS — E039 Hypothyroidism, unspecified: Secondary | ICD-10-CM | POA: Diagnosis not present

## 2021-09-18 DIAGNOSIS — K219 Gastro-esophageal reflux disease without esophagitis: Secondary | ICD-10-CM

## 2021-09-18 DIAGNOSIS — R058 Other specified cough: Secondary | ICD-10-CM

## 2021-09-18 DIAGNOSIS — G43009 Migraine without aura, not intractable, without status migrainosus: Secondary | ICD-10-CM

## 2021-09-18 DIAGNOSIS — F411 Generalized anxiety disorder: Secondary | ICD-10-CM

## 2021-09-18 DIAGNOSIS — M797 Fibromyalgia: Secondary | ICD-10-CM

## 2021-09-18 DIAGNOSIS — E782 Mixed hyperlipidemia: Secondary | ICD-10-CM | POA: Diagnosis not present

## 2021-09-18 DIAGNOSIS — G43909 Migraine, unspecified, not intractable, without status migrainosus: Secondary | ICD-10-CM

## 2021-09-18 DIAGNOSIS — R42 Dizziness and giddiness: Secondary | ICD-10-CM

## 2021-09-18 MED ORDER — PAROXETINE HCL 20 MG PO TABS: 20.0000 mg | ORAL_TABLET | Freq: Every day | ORAL | 3 refills | Status: DC

## 2021-09-18 MED ORDER — MONTELUKAST SODIUM 10 MG PO TABS: ORAL_TABLET | ORAL | 3 refills | Status: DC

## 2021-09-18 MED ORDER — RIZATRIPTAN BENZOATE 10 MG PO TABS: ORAL_TABLET | ORAL | 5 refills | Status: DC

## 2021-09-18 MED ORDER — FAMOTIDINE 20 MG PO TABS: 20.0000 mg | ORAL_TABLET | Freq: Two times a day (BID) | ORAL | 3 refills | Status: AC

## 2021-09-18 MED ORDER — LEVOTHYROXINE SODIUM 100 MCG PO TABS: ORAL_TABLET | ORAL | 3 refills | Status: DC

## 2021-09-18 MED ORDER — LEVOTHYROXINE SODIUM 88 MCG PO TABS: ORAL_TABLET | ORAL | 1 refills | Status: DC

## 2021-09-18 MED ORDER — ONDANSETRON HCL 4 MG PO TABS: ORAL_TABLET | ORAL | 2 refills | Status: DC

## 2021-09-18 MED ORDER — BENZONATATE 100 MG PO CAPS: 100.0000 mg | ORAL_CAPSULE | Freq: Three times a day (TID) | ORAL | 5 refills | Status: DC | PRN

## 2021-09-18 NOTE — Progress Notes (Signed)
Subjective  CC:  Chief Complaint  Patient presents with   Anxiety   Depression    Pt here to F/U on Anx/dep for refills    HPI: Claudia Luna is a 66 y.o. female who presents to the office today to address the problems listed above in the chief complaint. 66 year old female with multiple medical problems as listed below here for medication refills.  Last visit was in December 2022 for hypothyroidism recheck.  Labs at that time are stable.  She request refills on her thyroid medications.  She says her energy levels are normal. Her last annual physical was in August 2022.  She will schedule.  She is due for blood work. Upper airway cough syndrome on Tessalon Perles as needed Hyperlipidemia on statin. Intermittent vertigo requesting Zofran and meclizine to be used as needed Migraines: Uses Maxalt intermittently.  Needs refill.  No changes in headaches GERD: Has been on chronic PPIs.  Worried about risk of Alzheimer's.  Would like to change medications.  Tried weaning off but had breakthrough symptoms. Generalized anxiety disorder on Paxil 20.  She did try to wean herself off slowly over about 3 months but felt her anxiety symptoms return.  Request refill.  Had been working well.  Assessment  1. Hypothyroidism (acquired)   2. Upper airway cough syndrome   3. Migraine without status migrainosus, not intractable, unspecified migraine type   4. Mixed hyperlipidemia   5. Fibromyalgia, primary   6. Vertigo   7. Migraine without aura and without status migrainosus, not intractable   8. Gastroesophageal reflux disease, unspecified whether esophagitis present   9. GAD (generalized anxiety disorder)      Plan  Chronic medical problems as above:: Refilled medications as listed on medication list.  See below. Recommend complete physical with blood work.  Patient to schedule  Follow up: Return for complete physical.  01/13/2022  No orders of the defined types were placed in this  encounter.  Meds ordered this encounter  Medications   PARoxetine (PAXIL) 20 MG tablet    Sig: Take 1 tablet (20 mg total) by mouth daily.    Dispense:  90 tablet    Refill:  3   benzonatate (TESSALON) 100 MG capsule    Sig: Take 1 capsule (100 mg total) by mouth 3 (three) times daily as needed for cough.    Dispense:  60 capsule    Refill:  5   levothyroxine (SYNTHROID) 100 MCG tablet    Sig: TAKE 1 TABLET BY MOUTH MONDAY-SATURDAY    Dispense:  90 tablet    Refill:  3   levothyroxine (SYNTHROID) 88 MCG tablet    Sig: PLEASE TAKE ONE TABLET EVERY SUNDAY.    Dispense:  30 tablet    Refill:  1   montelukast (SINGULAIR) 10 MG tablet    Sig: TAKE 1 TABLET BY MOUTH EVERYDAY AT BEDTIME    Dispense:  90 tablet    Refill:  3    No more refills until OV   ondansetron (ZOFRAN) 4 MG tablet    Sig: TAKE 1 TABLET BY MOUTH EVERY 8 HOURS AS NEEDED FOR NAUSEA AND VOMITING    Dispense:  30 tablet    Refill:  2   rizatriptan (MAXALT) 10 MG tablet    Sig: Take 10mg  at onset of migraine, may repeat in 2 hours if needed    Dispense:  10 tablet    Refill:  5   famotidine (PEPCID) 20 MG tablet  Sig: Take 1 tablet (20 mg total) by mouth 2 (two) times daily.    Dispense:  180 tablet    Refill:  3      I reviewed the patients updated PMH, FH, and SocHx.    Patient Active Problem List   Diagnosis Date Noted   Chronic narcotic dependence (HCC) 02/17/2017    Priority: High   Family history of breast cancer in first degree relative 02/17/2017    Priority: High   Fibromyalgia, primary 12/24/2016    Priority: High   GAD (generalized anxiety disorder) 12/24/2016    Priority: High   Chronic pain syndrome 12/24/2016    Priority: High   Mixed hyperlipidemia 01/19/2007    Priority: High   Hypothyroidism (acquired) 09/01/2006    Priority: High   OA (osteoarthritis) of knee 07/24/2017    Priority: Medium    History of herniated intervertebral disc 12/24/2016    Priority: Medium    Migraine  headache without aura 12/06/2015    Priority: Medium    Upper airway cough syndrome 04/13/2012    Priority: Medium    Reactive airway disease that is not asthma 12/29/2007    Priority: Medium    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    Priority: Medium    Essential tremor 02/17/2017    Priority: Low   Vertigo 12/24/2016    Priority: Low   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   famotidine (PEPCID) 20 MG tablet Take 1 tablet (20 mg total) by mouth 2 (two) times daily.   gabapentin (NEURONTIN) 300 MG capsule Take 1 capsule (300 mg total) by mouth 2 (two) times daily.   ivabradine (CORLANOR) 7.5 MG TABS tablet Take 15mg  TWO hours prior to your cardiac CT scan.   rosuvastatin (CRESTOR) 10 MG tablet TAKE 1 TABLET BY MOUTH EVERYDAY AT BEDTIME   SYMBICORT 80-4.5 MCG/ACT inhaler INHALE 2 PUFFS INTO THE LUNGS TWICE A DAY   tapentadol (NUCYNTA) 50 MG tablet Take 50 mg by mouth every 4 (four) hours as needed.   valACYclovir (VALTREX) 1000 MG tablet TAKE 2 TABS AT ONSET OF COLD SORE AND REPEAT IN 12 HOURS   [DISCONTINUED] benzonatate (TESSALON) 100 MG capsule Take 1 capsule (100 mg total) by mouth 3 (three) times daily as needed for cough.   [DISCONTINUED] levothyroxine (SYNTHROID) 100 MCG tablet TAKE 1 TABLET BY MOUTH MONDAY-SATURDAY   [DISCONTINUED] levothyroxine (SYNTHROID) 88 MCG tablet PLEASE TAKE ONE TABLET EVERY SUNDAY.   [DISCONTINUED] montelukast (SINGULAIR) 10 MG tablet TAKE 1 TABLET BY MOUTH EVERYDAY AT BEDTIME   [DISCONTINUED] omeprazole (PRILOSEC) 20 MG capsule TAKE  1 CAPSULE BY MOUTH EVERY DAY   [DISCONTINUED] ondansetron (ZOFRAN) 4 MG tablet TAKE 1 TABLET BY MOUTH EVERY 8 HOURS AS NEEDED FOR NAUSEA AND VOMITING   [DISCONTINUED] PARoxetine (PAXIL) 20 MG tablet TAKE 1 TABLET BY MOUTH EVERY DAY   [DISCONTINUED] rizatriptan (MAXALT) 10 MG tablet TAKE 1 TABLET BY MOUTH DAILY AS NEEDED FOR HEADACHE.    Allergies: Patient is allergic to codeine, pregabalin, and penicillins. Family History: Patient family history  includes Breast cancer in her maternal aunt, maternal grandmother, and mother; Clotting disorder in her mother; Coronary artery disease in her father; GER disease in her sister; High Cholesterol in her sister; Hyperlipidemia in her father; Hypertension in her father and sister; Leukemia in her maternal aunt; Peripheral vascular disease in her father; Stroke in her father and mother. Social History:  Patient  reports that she has never smoked. She has never used smokeless tobacco. She reports current alcohol use. She reports that she does not use drugs.  Review of Systems: Constitutional: Negative for fever malaise or anorexia Cardiovascular: negative for chest pain Respiratory: negative for SOB or persistent cough Gastrointestinal: negative for abdominal pain  Objective  Vitals: BP 108/78   Pulse (!) 105   Temp 98.1 F (36.7 C)   Ht 5\' 1"  (1.549 m)   Wt 158 lb (71.7 kg)   SpO2 96%   BMI 29.85 kg/m  General: no acute distress , A&Ox3  Commons side effects, risks, benefits, and alternatives for medications and treatment plan prescribed today were discussed, and the patient expressed understanding of the given instructions. Patient is instructed to call or message via MyChart if he/she has any questions or concerns regarding our treatment plan. No barriers to understanding were identified. We discussed Red Flag symptoms and signs in detail. Patient expressed understanding regarding what to do in case of urgent or emergency type symptoms.   Medication list was reconciled, printed and provided to the patient in AVS. Patient instructions and summary information was reviewed with the patient as documented in the AVS. This note was prepared with assistance of Dragon voice recognition software. Occasional wrong-word or sound-a-like substitutions may have occurred due to the inherent limitations of voice recognition software  This visit occurred during the SARS-CoV-2 public health emergency.  Safety protocols were in place, including screening questions prior to the visit, additional usage of staff PPE, and extensive cleaning of exam room while observing appropriate contact time as indicated for disinfecting solutions.

## 2021-09-18 NOTE — Patient Instructions (Signed)
Please return for your annual complete physical; please come fasting.   I have refilled all of your medications as requested.   If you have any questions or concerns, please don't hesitate to send me a message via MyChart or call the office at [phone]. Thank you for visiting with [place] today! It's our pleasure caring for you.

## 2021-09-20 ENCOUNTER — Other Ambulatory Visit: Payer: Self-pay | Admitting: Family Medicine

## 2021-09-20 DIAGNOSIS — G43909 Migraine, unspecified, not intractable, without status migrainosus: Secondary | ICD-10-CM

## 2021-09-23 ENCOUNTER — Other Ambulatory Visit: Payer: Self-pay | Admitting: Family Medicine

## 2021-09-23 DIAGNOSIS — G43909 Migraine, unspecified, not intractable, without status migrainosus: Secondary | ICD-10-CM

## 2021-10-28 ENCOUNTER — Encounter: Payer: Self-pay | Admitting: *Deleted

## 2022-01-13 ENCOUNTER — Encounter: Payer: 59 | Admitting: Family Medicine

## 2022-01-21 ENCOUNTER — Ambulatory Visit: Payer: Self-pay | Admitting: Family Medicine

## 2022-03-24 ENCOUNTER — Ambulatory Visit (INDEPENDENT_AMBULATORY_CARE_PROVIDER_SITE_OTHER): Payer: Medicare Other | Admitting: Family Medicine

## 2022-03-24 ENCOUNTER — Encounter: Payer: Self-pay | Admitting: Family Medicine

## 2022-03-24 VITALS — BP 96/60 | HR 109 | Temp 97.8°F | Ht 61.0 in | Wt 168.2 lb

## 2022-03-24 DIAGNOSIS — F1120 Opioid dependence, uncomplicated: Secondary | ICD-10-CM | POA: Diagnosis not present

## 2022-03-24 DIAGNOSIS — H60392 Other infective otitis externa, left ear: Secondary | ICD-10-CM | POA: Diagnosis not present

## 2022-03-24 DIAGNOSIS — M797 Fibromyalgia: Secondary | ICD-10-CM

## 2022-03-24 MED ORDER — NEOMYCIN-POLYMYXIN-HC 3.5-10000-1 OT SOLN: 3.0000 [drp] | Freq: Four times a day (QID) | OTIC | 0 refills | Status: DC

## 2022-03-24 NOTE — Progress Notes (Signed)
Subjective  CC:  Chief Complaint  Patient presents with   Ear Pain    Pt stated that she has been having some bad ear pain since 03/19/2022 and has gotten worse     HPI: Claudia Luna Pati is a 67 y.o. female who presents to the office today to address the problems listed above in the chief complaint. 67 year old with acute onset of left ear pain, moderate to severe at times for the last 4 to 5 days.  Feels swollen inside.  No trauma, Q-tips or recent swimming.  No history of otitis externa.  She is not a diabetic.  No fevers or chills or URI symptoms.  Hearing is muffled Assessment  1. Other infective acute otitis externa of left ear   2. Chronic narcotic dependence ([place])   3. Fibromyalgia, primary      Plan  Acute otitis externa: Treat with Cortisporin otic solution 4 times daily.  She is on chronic pain medicine that is helpful.  Follow-up if not improving.  Follow up: She is due for follow-up and is awaiting her Medicare, she will schedule it as soon as she gets it Visit date not found  No orders of the defined types were placed in this encounter.  Meds ordered this encounter  Medications   neomycin-polymyxin-hydrocortisone (CORTISPORIN) OTIC solution    Sig: Place 3 drops into the left ear 4 (four) times daily.    Dispense:  10 mL    Refill:  0      I reviewed the patients updated PMH, FH, and SocHx.    Patient Active Problem List   Diagnosis Date Noted   Chronic narcotic dependence ([place]) 02/17/2017    Priority: High   Family history of breast cancer in first degree relative 02/17/2017    Priority: High   Fibromyalgia, primary 12/24/2016    Priority: High   GAD (generalized anxiety disorder) 12/24/2016    Priority: High   Chronic pain syndrome 12/24/2016    Priority: High   Mixed hyperlipidemia 01/19/2007    Priority: High   Hypothyroidism (acquired) 09/01/2006    Priority: High   OA (osteoarthritis) of knee 07/24/2017    Priority: Medium    History of  herniated intervertebral disc 12/24/2016    Priority: Medium    Migraine headache without aura 12/06/2015    Priority: Medium    Upper airway cough syndrome 04/13/2012    Priority: Medium    Reactive airway disease that is not asthma 12/29/2007    Priority: Medium    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    Priority: Medium    Essential tremor 02/17/2017    Priority: Low   Vertigo 12/24/2016    Priority: Low   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   Cyanocobalamin (B-12 PO) Take 1 tablet by mouth daily.   famotidine (PEPCID) 20 MG tablet Take 1 tablet (20 mg total) by mouth 2 (two) times daily.   gabapentin (NEURONTIN) 300 MG capsule Take 1  capsule (300 mg total) by mouth 2 (two) times daily.   levothyroxine (SYNTHROID) 100 MCG tablet TAKE 1 TABLET BY MOUTH MONDAY-SATURDAY   levothyroxine (SYNTHROID) 88 MCG tablet PLEASE TAKE ONE TABLET EVERY SUNDAY.   montelukast (SINGULAIR) 10 MG tablet TAKE 1 TABLET BY MOUTH EVERYDAY AT BEDTIME   neomycin-polymyxin-hydrocortisone (CORTISPORIN) OTIC solution Place 3 drops into the left ear 4 (four) times daily.   ondansetron (ZOFRAN) 4 MG tablet TAKE 1 TABLET BY MOUTH EVERY 8 HOURS AS NEEDED FOR NAUSEA AND VOMITING   PARoxetine (PAXIL) 20 MG tablet TAKE 1 TABLET BY MOUTH EVERY DAY   rizatriptan (MAXALT) 10 MG tablet Take 45m at onset of migraine, may repeat in 2 hours if needed   rosuvastatin (CRESTOR) 10 MG tablet TAKE 1 TABLET BY MOUTH EVERYDAY AT BEDTIME    SYMBICORT 80-4.5 MCG/ACT inhaler INHALE 2 PUFFS INTO THE LUNGS TWICE A DAY   tapentadol (NUCYNTA) 50 MG tablet Take 50 mg by mouth every 4 (four) hours as needed.   valACYclovir (VALTREX) 1000 MG tablet TAKE 2 TABS AT ONSET OF COLD SORE AND REPEAT IN 12 HOURS    Allergies: Patient is allergic to codeine, pregabalin, and penicillins. Family History: Patient family history includes Breast cancer in her maternal aunt, maternal grandmother, and mother; Clotting disorder in her mother; Coronary artery disease in her father; GER disease in her sister; High Cholesterol in her sister; Hyperlipidemia in her father; Hypertension in her father and sister; Leukemia in her maternal aunt; Peripheral vascular disease in her father; Stroke in her father and mother. Social History:  Patient  reports that she has never smoked. She has never used smokeless tobacco. She reports current alcohol use. She reports that she does not use drugs.  Review of Systems: Constitutional: Negative for fever malaise or anorexia Cardiovascular: negative for chest pain Respiratory: negative for SOB or persistent cough Gastrointestinal: negative for abdominal pain  Objective  Vitals: BP 96/60   Pulse (!) 109   Temp 97.8 F (36.6 C)   Ht 5' 1"$  (1.549 m)   Wt 168 lb 3.2 oz (76.3 kg)   SpO2 97%   BMI 31.78 kg/m  General: Appears uncomfortable, alert and oriented x 3 HEENT: PEERL, conjunctiva normal, neck is supple, no cervical lymphadenopathy, right TM is normal, left TM is obstructed by edematous erythematous external auditory canal, tender pinna was   Commons side effects, risks, benefits, and alternatives for medications and treatment plan prescribed today were discussed, and the patient expressed understanding of the given instructions. Patient is instructed to call or message via MyChart if he/she has any questions or concerns regarding our treatment plan. No barriers to understanding were identified. We discussed Red  Flag symptoms and signs in detail. Patient expressed understanding regarding what to do in case of urgent or emergency type symptoms.  Medication list was reconciled, printed and provided to the patient in AVS. Patient instructions and summary information was reviewed with the patient as documented in the AVS. This note was prepared with assistance of Dragon voice recognition software. Occasional wrong-word or sound-a-like substitutions may have occurred due to the inherent limitations of voice recognition software

## 2022-03-24 NOTE — Patient Instructions (Signed)
Otitis Externa  Otitis externa is an infection of the outer ear canal. The outer ear canal is the area between the outside of the ear and the eardrum. Otitis externa is sometimes called swimmer's ear. What are the causes? Common causes of this condition include: Swimming in dirty water. Moisture in the ear. An injury to the inside of the ear. An object stuck in the ear. A cut or scrape on the outside of the ear or in the ear canal. What increases the risk? You are more likely to develop this condition if you go swimming often. What are the signs or symptoms? The first symptom of this condition is often itching in the ear. Later symptoms of the condition include: Swelling of the ear. Redness in the ear. Ear pain. The pain may get worse when you pull on your ear. Pus coming from the ear. How is this diagnosed? This condition may be diagnosed by examining the ear and testing fluid from the ear for bacteria and funguses. How is this treated? This condition may be treated with: Antibiotic ear drops. These are often given for 10-14 days. Medicines to reduce itching and swelling. Follow these instructions at home: If you were prescribed antibiotic ear drops, use them as told by your health care provider. Do not stop using the antibiotic even if you start to feel better. Take over-the-counter and prescription medicines only as told by your health care provider. Avoid getting water in your ears as told by your health care provider. This may include avoiding swimming or water sports for a few days. Keep all follow-up visits. This is important. How is this prevented? Keep your ears dry. Use the corner of a towel to dry your ears after you swim or bathe. Avoid scratching or putting things in your ear. Doing these things can damage the ear canal or remove the protective wax that lines it, which makes it easier for bacteria and funguses to grow. Avoid swimming in lakes, polluted water, or swimming  pools that may not have enough chlorine. Contact a health care provider if: You have a fever. Your ear is still red, swollen, painful, or draining pus after 3 days. Your redness, swelling, or pain gets worse. You have a severe headache. Get help right away if: You have redness, swelling, and pain or tenderness in the area behind your ear. Summary Otitis externa is an infection of the outer ear canal. Common causes include swimming in dirty water, moisture in the ear, or a cut or scrape in the ear. Symptoms include pain, redness, and swelling of the ear canal. If you were prescribed antibiotic ear drops, use them as told by your health care provider. Do not stop using the antibiotic even if you start to feel better. This information is not intended to replace advice given to you by your health care provider. Make sure you discuss any questions you have with your health care provider. Document Revised: 04/04/2020 Document Reviewed: 04/04/2020 Elsevier Patient Education  [place].

## 2022-03-26 ENCOUNTER — Telehealth: Payer: Self-pay | Admitting: Nurse Practitioner

## 2022-03-26 DIAGNOSIS — B9789 Other viral agents as the cause of diseases classified elsewhere: Secondary | ICD-10-CM

## 2022-03-26 DIAGNOSIS — J329 Chronic sinusitis, unspecified: Secondary | ICD-10-CM

## 2022-03-26 MED ORDER — FLUTICASONE PROPIONATE 50 MCG/ACT NA SUSP: 2.0000 | Freq: Every day | NASAL | 6 refills | Status: AC

## 2022-03-26 NOTE — Progress Notes (Signed)
E-Visit for Sinus Problems  We are sorry that you are not feeling well.  Here is how we plan to help!  Based on what you have shared with me it looks like you have sinusitis.  Sinusitis is inflammation and infection in the sinus cavities of the head.  Based on your presentation I believe you most likely have Acute Viral Sinusitis.This is an infection most likely caused by a virus. There is not specific treatment for viral sinusitis other than to help you with the symptoms until the infection runs its course.  You may use an oral decongestant such as Mucinex D or if you have glaucoma or high blood pressure use plain Mucinex. Saline nasal spray help and can safely be used as often as needed for congestion, I have prescribed: Fluticasone nasal spray two sprays in each nostril once a day  Providers prescribe antibiotics to treat infections caused by bacteria. Antibiotics are very powerful in treating bacterial infections when they are used properly. To maintain their effectiveness, they should be used only when necessary. Overuse of antibiotics has resulted in the development of superbugs that are resistant to treatment!    After careful review of your answers, I would not recommend an antibiotic for your condition.  Antibiotics are not effective against viruses and therefore should not be used to treat them. Common examples of infections caused by viruses include colds and flu   Some authorities believe that zinc sprays or the use of Echinacea may shorten the course of your symptoms.  Sinus infections are not as easily transmitted as other respiratory infection, however we still recommend that you avoid close contact with loved ones, especially the very young and elderly.  Remember to wash your hands thoroughly throughout the day as this is the number one way to prevent the spread of infection!  Home Care: Only take medications as instructed by your medical team. Do not take these medications with  alcohol. A steam or ultrasonic humidifier can help congestion.  You can place a towel over your head and breathe in the steam from hot water coming from a faucet. Avoid close contacts especially the very young and the elderly. Cover your mouth when you cough or sneeze. Always remember to wash your hands.  Get Help Right Away If: You develop worsening fever or sinus pain. You develop a severe head ache or visual changes. Your symptoms persist after you have completed your treatment plan.  Make sure you Understand these instructions. Will watch your condition. Will get help right away if you are not doing well or get worse.   Thank you for choosing an e-visit.  Your e-visit answers were reviewed by a board certified advanced clinical practitioner to complete your personal care plan. Depending upon the condition, your plan could have included both over the counter or prescription medications.  Please review your pharmacy choice. Make sure the pharmacy is open so you can pick up prescription now. If there is a problem, you may contact your provider through [employer] and have the prescription routed to another pharmacy.  Your safety is important to [place]. If you have drug allergies check your prescription carefully.   For the next 24 hours you can use MyChart to ask questions about today's visit, request a non-urgent call back, or ask for a work or school excuse. You will get an email in the next two days asking about your experience. I hope that your e-visit has been valuable and will speed your recovery.  Meds ordered this encounter  Medications   fluticasone (FLONASE) 50 MCG/ACT nasal spray    Sig: Place 2 sprays into both nostrils daily.    Dispense:  16 g    Refill:  6     I spent approximately 5 minutes reviewing the patient's history, current symptoms and coordinating their care today.

## 2022-04-01 ENCOUNTER — Other Ambulatory Visit: Payer: Self-pay | Admitting: Family Medicine

## 2022-04-01 DIAGNOSIS — R058 Other specified cough: Secondary | ICD-10-CM

## 2022-04-01 DIAGNOSIS — G43909 Migraine, unspecified, not intractable, without status migrainosus: Secondary | ICD-10-CM

## 2022-04-29 ENCOUNTER — Other Ambulatory Visit: Payer: Self-pay | Admitting: Family Medicine

## 2022-05-11 ENCOUNTER — Other Ambulatory Visit: Payer: Self-pay | Admitting: Family Medicine

## 2022-07-16 ENCOUNTER — Telehealth: Payer: Self-pay

## 2022-07-16 NOTE — Telephone Encounter (Signed)
Pharmacy Patient Advocate Encounter  Received notification from [facility] that the request for prior authorization for Benzonatate 100mg  has been denied due to plan exclusion.      Please be advised we currently do not have a Pharmacist to review denials, therefore you will need to process appeals accordingly as needed. Thanks for your support at this time.   You may call [phone] or fax [phone], to appeal. Denial letter attached to charts.

## 2022-08-05 ENCOUNTER — Telehealth: Payer: Self-pay

## 2022-08-05 NOTE — Telephone Encounter (Signed)
Called pt regarding a fax we had received from [facility] requesting refill. Just wanted to confirm with patient she want her medication to though mail order or [facility]. Left detail vm regarding this.

## 2022-09-19 ENCOUNTER — Other Ambulatory Visit: Payer: Self-pay | Admitting: Family Medicine

## 2022-09-28 ENCOUNTER — Other Ambulatory Visit: Payer: Self-pay | Admitting: Family Medicine

## 2022-10-04 ENCOUNTER — Other Ambulatory Visit: Payer: Self-pay | Admitting: Family Medicine

## 2022-10-04 DIAGNOSIS — R058 Other specified cough: Secondary | ICD-10-CM

## 2022-10-19 ENCOUNTER — Other Ambulatory Visit: Payer: Self-pay | Admitting: Family Medicine

## 2022-11-17 ENCOUNTER — Other Ambulatory Visit: Payer: Self-pay | Admitting: Family Medicine

## 2022-12-19 ENCOUNTER — Other Ambulatory Visit: Payer: Self-pay | Admitting: Family Medicine

## 2022-12-19 DIAGNOSIS — G43909 Migraine, unspecified, not intractable, without status migrainosus: Secondary | ICD-10-CM

## 2022-12-28 ENCOUNTER — Other Ambulatory Visit: Payer: Self-pay | Admitting: Family Medicine

## 2022-12-28 DIAGNOSIS — R058 Other specified cough: Secondary | ICD-10-CM

## 2023-03-05 ENCOUNTER — Ambulatory Visit: Payer: Medicare Other | Admitting: Family Medicine

## 2023-03-18 ENCOUNTER — Other Ambulatory Visit: Payer: Self-pay | Admitting: Family Medicine

## 2023-03-25 ENCOUNTER — Ambulatory Visit: Payer: Medicare Other

## 2023-08-14 ENCOUNTER — Other Ambulatory Visit: Payer: Self-pay | Admitting: Family Medicine

## 2023-08-14 DIAGNOSIS — R058 Other specified cough: Secondary | ICD-10-CM

## 2023-08-22 ENCOUNTER — Other Ambulatory Visit: Payer: Self-pay | Admitting: Family Medicine

## 2023-08-22 DIAGNOSIS — G43909 Migraine, unspecified, not intractable, without status migrainosus: Secondary | ICD-10-CM

## 2023-09-16 ENCOUNTER — Encounter: Payer: PRIVATE HEALTH INSURANCE | Admitting: Family Medicine

## 2023-09-22 ENCOUNTER — Encounter: Payer: PRIVATE HEALTH INSURANCE | Admitting: Family Medicine

## 2023-09-26 ENCOUNTER — Other Ambulatory Visit (HOSPITAL_COMMUNITY): Payer: Self-pay

## 2023-10-13 ENCOUNTER — Ambulatory Visit: Payer: PRIVATE HEALTH INSURANCE | Admitting: Internal Medicine

## 2023-10-20 ENCOUNTER — Ambulatory Visit (INDEPENDENT_AMBULATORY_CARE_PROVIDER_SITE_OTHER): Payer: PRIVATE HEALTH INSURANCE | Admitting: Family Medicine

## 2023-10-20 ENCOUNTER — Encounter: Payer: Self-pay | Admitting: Family Medicine

## 2023-10-20 VITALS — BP 137/85 | HR 107 | Temp 97.7°F | Ht 61.0 in | Wt 159.4 lb

## 2023-10-20 DIAGNOSIS — F1120 Opioid dependence, uncomplicated: Secondary | ICD-10-CM

## 2023-10-20 DIAGNOSIS — E039 Hypothyroidism, unspecified: Secondary | ICD-10-CM

## 2023-10-20 DIAGNOSIS — Z23 Encounter for immunization: Secondary | ICD-10-CM

## 2023-10-20 DIAGNOSIS — Z780 Asymptomatic menopausal state: Secondary | ICD-10-CM

## 2023-10-20 DIAGNOSIS — R058 Other specified cough: Secondary | ICD-10-CM

## 2023-10-20 DIAGNOSIS — E782 Mixed hyperlipidemia: Secondary | ICD-10-CM

## 2023-10-20 DIAGNOSIS — K219 Gastro-esophageal reflux disease without esophagitis: Secondary | ICD-10-CM

## 2023-10-20 DIAGNOSIS — Z1211 Encounter for screening for malignant neoplasm of colon: Secondary | ICD-10-CM

## 2023-10-20 DIAGNOSIS — Z0000 Encounter for general adult medical examination without abnormal findings: Secondary | ICD-10-CM | POA: Diagnosis not present

## 2023-10-20 DIAGNOSIS — Z1231 Encounter for screening mammogram for malignant neoplasm of breast: Secondary | ICD-10-CM

## 2023-10-20 DIAGNOSIS — G894 Chronic pain syndrome: Secondary | ICD-10-CM

## 2023-10-20 DIAGNOSIS — F411 Generalized anxiety disorder: Secondary | ICD-10-CM

## 2023-10-20 NOTE — Progress Notes (Signed)
 Subjective:    Claudia Luna is a 68 y.o. female who presents for a Welcome to Medicare exam.          Objective:    Today's Vitals   10/20/23 1016  BP: 137/85  Pulse: (!) 107  Temp: 97.7 F (36.5 C)  SpO2: 93%  Weight: 159 lb 6.4 oz (72.3 kg)  Height: 5' 1 (1.549 m)  Body mass index is 30.12 kg/m.  Medications Outpatient Encounter Medications as of 10/20/2023  Medication Sig   albuterol  (VENTOLIN  HFA) 108 (90 Base) MCG/ACT inhaler INHALE 2 PUFFS BY MOUTH EVERY 6 HOURS AS NEEDED FOR WHEEZE OR SHORTNESS OF BREATH   benzonatate  (TESSALON ) 100 MG capsule TAKE 1 CAPSULE BY MOUTH THREE TIMES A DAY AS NEEDED FOR COUGH   famotidine  (PEPCID ) 20 MG tablet Take 1 tablet (20 mg total) by mouth 2 (two) times daily.   levothyroxine  (SYNTHROID ) 100 MCG tablet TAKE 1 TABLET BY MOUTH MONDAY-SATURDAY   levothyroxine  (SYNTHROID ) 88 MCG tablet PLEASE TAKE ONE TABLET EVERY SUNDAY.   montelukast  (SINGULAIR ) 10 MG tablet TAKE 1 TABLET BY MOUTH EVERYDAY AT BEDTIME   omeprazole  (PRILOSEC) 20 MG capsule TAKE 1 CAPSULE BY MOUTH EVERY DAY   ondansetron  (ZOFRAN ) 4 MG tablet TAKE 1 TABLET BY MOUTH EVERY 8 HOURS AS NEEDED FOR NAUSEA AND VOMITING   PARoxetine  (PAXIL ) 20 MG tablet TAKE 1 TABLET BY MOUTH EVERY DAY   rizatriptan  (MAXALT ) 10 MG tablet TAKE 1 TABLET AT ONSET OF MIGRAINE, MAY REPEAT IN 2 HOURS IF NEEDED   rosuvastatin  (CRESTOR ) 10 MG tablet TAKE 1 TABLET BY MOUTH EVERYDAY AT BEDTIME   SYMBICORT  80-4.5 MCG/ACT inhaler INHALE 2 PUFFS INTO THE LUNGS TWICE A DAY   Cyanocobalamin (B-12 PO) Take 1 tablet by mouth daily. (Patient not taking: Reported on 10/20/2023)   fluticasone  (FLONASE ) 50 MCG/ACT nasal spray Place 2 sprays into both nostrils daily. (Patient not taking: Reported on 10/20/2023)   gabapentin  (NEURONTIN ) 300 MG capsule Take 1 capsule (300 mg total) by mouth 2 (two) times daily. (Patient not taking: Reported on 10/20/2023)   neomycin -polymyxin-hydrocortisone  (CORTISPORIN) OTIC  solution Place 3 drops into the left ear 4 (four) times daily. (Patient not taking: Reported on 10/20/2023)   tapentadol (NUCYNTA) 50 MG tablet Take 50 mg by mouth every 4 (four) hours as needed. (Patient not taking: Reported on 10/20/2023)   valACYclovir  (VALTREX ) 1000 MG tablet TAKE 2 TABS AT ONSET OF COLD SORE AND REPEAT IN 12 HOURS (Patient not taking: Reported on 10/20/2023)   No facility-administered encounter medications on file as of 10/20/2023.     History: Past Medical History:  Diagnosis Date   Anxiety    Chronic kidney disease    kidney stones, history of   Depression    Essential tremor 02/17/2017   Family history of breast cancer in first degree relative 02/17/2017   Mother, maternal aunt and grandmother   Fibromyalgia    Fibromyalgia, primary 12/24/2016   Dxd in 1992; with chronic fatigue.    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 Uses zofran  and meclizine  as needed   Past Surgical History:  Procedure Laterality Date   ABDOMINAL HYSTERECTOMY     complete   APPENDECTOMY     BREAST SURGERY  1993   reduction   CHOLECYSTECTOMY     crown replaced      02/17/2018   KNEE ARTHROSCOPY     total knee. 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   aneurysm repair    TOTAL KNEE ARTHROPLASTY Left 02/22/2018   Procedure: TOTAL KNEE ARTHROPLASTY;  Surgeon: Melodi Lerner, MD;  Location: [facility];  Service: Orthopedics;  Laterality: Left;     Family History   Problem Relation Age of Onset   Breast cancer Mother        breast   Stroke Mother        mini   Clotting disorder Mother        ITP   Coronary artery disease Father        status post CABG   Peripheral vascular disease Father    Hypertension Father    Hyperlipidemia Father    Stroke Father        multiple   Hypertension Sister    High Cholesterol Sister    GER disease Sister    Breast cancer Maternal Aunt    Leukemia Maternal Aunt    Breast cancer Maternal Grandmother    Social History   Occupational History   Occupation: Freight forwarder    Comment: Quit 2014 - due to health issues  Tobacco Use   Smoking status: Never   Smokeless tobacco: Never  Vaping Use   Vaping status: Never Used  Substance and Sexual Activity   Alcohol use: Yes    Alcohol/week: 0.0 standard drinks of alcohol    Comment: 2-4 drinks/year   Drug use: No   Sexual activity: Yes    Birth control/protection: Post-menopausal    Tobacco Counseling Counseling given: Not Answered   Immunizations and Health Maintenance Immunization History  Administered Date(s) Administered   DTaP 11/05/2008   Fluad Quad(high Dose 65+) 01/08/2021   Hep A / Hep B 03/31/2010, 09/23/2010, 10/21/2010   INFLUENZA, HIGH DOSE SEASONAL PF 12/29/2022   Influenza Inj Mdck Quad Pf 11/16/2021   Influenza Whole 02/02/2004, 12/02/2006, 12/28/2007   Influenza,inj,Quad PF,6+ Mos 12/02/2012, 01/31/2015, 11/08/2015, 12/24/2016, 01/11/2018, 10/29/2018, 01/08/2020   Influenza,inj,quad, With Preservative 02/03/2017   Influenza-Unspecified 11/30/2013   MMR 05/05/2023   Moderna Covid-19 Vaccine Bivalent Booster 71yrs & up 11/16/2021   PFIZER Comirnaty(Gray Top)Covid-19 Tri-Sucrose Vaccine 05/29/2020   PFIZER(Purple Top)SARS-COV-2 Vaccination 04/28/2019, 05/25/2019, 11/29/2019   Pfizer Covid-19 Vaccine Bivalent Booster 23yrs & up 12/02/2020   Pneumococcal Polysaccharide-23 12/28/2007, 01/08/2020   Respiratory Syncytial  Virus Vaccine,Recomb Aduvanted(Arexvy) 12/29/2021   Tdap 02/03/2013, 05/05/2023   Zoster Recombinant(Shingrix) 05/29/2020, 09/04/2020   Zoster, Live 10/27/2011   Health Maintenance Due  Topic Date Due   Medicare Annual Wellness (AWV)  Never done   Fecal DNA (Cologuard)  Never done   Mammogram  03/06/2017   Pneumococcal Vaccine: 50+ Years (2 of 2 - PCV) 01/07/2021   DEXA SCAN  Never done    Activities of Daily Living    10/20/2023   10:20 AM  In your present state of health, do you have any difficulty performing the following activities:  Hearing? 0  Vision? 0  Difficulty concentrating or making decisions? 1  Comment concentration  Walking or climbing stairs? 0  Dressing or bathing? 0  Doing errands, shopping? 1  Comment from fibromyalgia    Physical Exam   Physical Exam (optional), or other factors deemed appropriate based on the beneficiary's medical and social history and current clinical standards.   Advanced Directives: education and counseling. Needs forms.    EKG:  unchanged from previous tracings, vR1, unchanged from 2020. Nonspecific changes. Old infarct      Assessment:    This is a routine wellness examination for this patient . Also f/u chronic problems  Encounter Diagnoses  Name Primary?   Welcome to Medicare preventive visit Yes   Mixed hyperlipidemia    Hypothyroidism (acquired)    Chronic narcotic dependence (HCC)    Chronic pain syndrome    GAD (generalized anxiety disorder)    Upper airway cough syndrome    Gastroesophageal reflux disease, unspecified whether esophagitis present      Vision/Hearing screen No results found.   Goals   None     Depression Screen    10/20/2023   10:15 AM 03/24/2022    3:54 PM 09/04/2020   10:00 AM 05/29/2020   11:03 AM  PHQ 2/9 Scores  PHQ - 2 Score 0 0 0 0  PHQ- 9 Score   0      Fall Risk    10/20/2023   10:14 AM  Fall Risk   Falls in the past year? 0  Number falls in past yr: 0  Injury with  Fall? 0  Risk for fall due to : No Fall Risks  Follow up Falls evaluation completed    Cognitive Function:        10/20/2023   10:21 AM  6CIT Screen  What Year? 0 points  What month? 0 points  What time? 0 points  Count back from 20 0 points  Months in reverse 0 points  Repeat phrase 0 points  Total Score 0 points    Patient Care Team: Jodie Lavern CROME, MD as PCP - General (Family Medicine) Cozetta Back, MD as Referring Physician (Physical Medicine and Rehabilitation) Kandyce Sor, MD as Consulting Physician (Obstetrics and Gynecology) Leora Lenis, MD as Consulting Physician (Plastic Surgery) Melodi Lerner, MD as Consulting Physician (Orthopedic Surgery) Domenica Reusing, MD as Consulting Physician (Neurology) Fernande Elspeth BROCKS, MD as Consulting Physician (Cardiology)     Plan:   Routine guidance given.  Discussed hcm: needs: has multiple overdue screens. Educated on importance of annual visits and screens. Needs mammogram and colonoscopy. Prevnar 20 updated today  I have personally reviewed and noted the following in the patient's chart:   Medical and social history Use of alcohol, tobacco or illicit drugs  Current medications and supplements including opioid prescriptions. Patient is currently taking opioid prescriptions. Information provided to patient regarding non-opioid alternatives. Patient advised to discuss non-opioid treatment plan with their provider. Functional ability and status Nutritional status Physical activity Advanced directives List of other physicians Hospitalizations, surgeries, and ER visits in previous 12 months Vitals Screenings to include cognitive, depression, and falls Referrals and appointments  In addition, I have reviewed and discussed with patient certain preventive protocols, quality metrics, and best practice recommendations. A written personalized care plan for preventive services as well as general preventive health  recommendations were provided to patient.     Lavern CROME Jodie, MD 10/20/2023    Subjective  CC:  Chief Complaint  Patient presents with  Annual Exam    Pt her for Annual Exam and is not fasting. Mammo, DEXA, and colonoscopy has not been done     HPI: Claudia Luna is a 68 y.o. female who presents to the office today to address the problems listed above in the chief complaint. Discussed the use of AI scribe software for clinical note transcription with the patient, who gave verbal consent to proceed.  History of Present Illness Hypothyroidism due for recheck. Says she feels well.   Chronic pain/fibro and narcotics: per specialsits  GERD on meds. Prilosec; LPR on tessalon   Prozac for mood. Reports stable   Needs refills on meds.    Assessment  1. Welcome to Medicare preventive visit   2. Mixed hyperlipidemia   3. Hypothyroidism (acquired)   4. Chronic narcotic dependence (HCC)   5. Chronic pain syndrome   6. GAD (generalized anxiety disorder)   7. Upper airway cough syndrome   8. Gastroesophageal reflux disease, unspecified whether esophagitis present   9. Screening mammogram for breast cancer   10. Screening for colorectal cancer   11. Asymptomatic menopausal state   12. Need for influenza vaccination   13. Need for pneumococcal 20-valent conjugate vaccination      Plan  Assessment and Plan Assessment & Plan  Chronic problems reviewed. Refilled meds. Check labs.  Needs fu q 6 mo at minimum.   Follow up: 6 mo Orders Placed This Encounter  Procedures   DG Bone Density   MM DIGITAL SCREENING BILATERAL   Flu vaccine HIGH DOSE PF(Fluzone Trivalent)   Pneumococcal conjugate vaccine 20-valent (Prevnar 20)   CBC with Differential/Platelet   Comprehensive metabolic panel with GFR   Lipid panel   TSH   Ambulatory referral to Gastroenterology   EKG 12-Lead   No orders of the defined types were placed in this encounter.    I reviewed the patients  updated PMH, FH, and SocHx.  Patient Active Problem List   Diagnosis Date Noted   Chronic narcotic dependence (HCC) 02/17/2017    Priority: High   Family history of breast cancer in first degree relative 02/17/2017    Priority: High   Fibromyalgia, primary 12/24/2016    Priority: High   GAD (generalized anxiety disorder) 12/24/2016    Priority: High   Chronic pain syndrome 12/24/2016    Priority: High   Mixed hyperlipidemia 01/19/2007    Priority: High   Hypothyroidism (acquired) 09/01/2006    Priority: High   Gastroesophageal reflux disease 09/18/2021    Priority: Medium    OA (osteoarthritis) of knee 07/24/2017    Priority: Medium    History of herniated intervertebral disc 12/24/2016    Priority: Medium    Migraine headache without aura 12/06/2015    Priority: Medium    Upper airway cough syndrome 04/13/2012    Priority: Medium    Chronic migraine without aura 10/15/2011    Priority: Medium    Reactive airway disease that is not asthma 12/29/2007    Priority: Medium    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    Priority: Medium    Essential tremor 02/17/2017    Priority: Low   Vertigo 12/24/2016    Priority: Low   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   levothyroxine  (SYNTHROID ) 100 MCG tablet TAKE 1 TABLET BY MOUTH MONDAY-SATURDAY   levothyroxine  (SYNTHROID ) 88 MCG tablet PLEASE TAKE ONE TABLET EVERY SUNDAY.   montelukast  (SINGULAIR ) 10 MG tablet TAKE 1 TABLET BY MOUTH EVERYDAY AT BEDTIME    omeprazole  (PRILOSEC) 20 MG capsule TAKE 1 CAPSULE BY MOUTH EVERY DAY   ondansetron  (ZOFRAN ) 4 MG tablet TAKE 1 TABLET BY MOUTH EVERY 8 HOURS AS NEEDED FOR NAUSEA AND VOMITING   PARoxetine  (PAXIL ) 20 MG tablet TAKE 1 TABLET BY MOUTH EVERY DAY   rizatriptan  (MAXALT ) 10 MG tablet TAKE 1 TABLET AT ONSET OF MIGRAINE, MAY REPEAT IN 2 HOURS IF NEEDED   rosuvastatin  (CRESTOR ) 10 MG tablet TAKE 1 TABLET BY MOUTH EVERYDAY AT BEDTIME   SYMBICORT  80-4.5 MCG/ACT inhaler INHALE 2 PUFFS INTO THE LUNGS TWICE A DAY   Allergies: Patient is allergic to codeine, pregabalin, and penicillins. Family History: Patient family history includes Breast cancer in her maternal aunt, maternal grandmother, and mother; Clotting disorder in her mother; Coronary artery disease in her father; GER disease in her sister; High Cholesterol in her sister; Hyperlipidemia in her father; Hypertension in her father and sister; Leukemia in her maternal aunt; Peripheral vascular disease in her father; Stroke in her father and mother. Social History:  Patient  reports that she has never smoked. She has never used smokeless tobacco. She reports current alcohol use. She reports that she does not use drugs.  Review of Systems: Constitutional: Negative for fever malaise or anorexia Cardiovascular: negative for chest pain Respiratory: negative for SOB or persistent cough Gastrointestinal: negative for abdominal pain  Objective  Vitals: BP 137/85   Pulse (!) 107   Temp 97.7 F (36.5 C)   Ht 5' 1 (1.549 m)   Wt 159 lb 6.4 oz (72.3 kg)   SpO2 93%   BMI 30.12 kg/m  General: no acute distress , A&Ox3 HEENT: PEERL, conjunctiva normal, neck is supple Cardiovascular:  RRR without murmur or gallop.  Respiratory:  Good breath sounds bilaterally, CTAB with normal respiratory effort Skin:  Warm, no rashes Commons side effects, risks, benefits, and alternatives for medications and treatment plan prescribed today were discussed, and the patient  expressed understanding of the given instructions. Patient is instructed to call or message via MyChart if he/she has any questions or concerns regarding our treatment plan. No barriers to understanding were identified. We discussed Red Flag symptoms and signs in detail. Patient expressed understanding regarding what to do in case of urgent or emergency type symptoms.  Medication list was reconciled, printed and provided to the patient in AVS. Patient instructions and summary information was reviewed with the patient as documented in the AVS. This note was prepared with assistance of Dragon voice recognition software. Occasional wrong-word or sound-a-like substitutions may have occurred due to the inherent limitations of voice recognition software

## 2023-10-22 ENCOUNTER — Other Ambulatory Visit (INDEPENDENT_AMBULATORY_CARE_PROVIDER_SITE_OTHER)

## 2023-10-22 DIAGNOSIS — E039 Hypothyroidism, unspecified: Secondary | ICD-10-CM | POA: Diagnosis not present

## 2023-10-22 DIAGNOSIS — K219 Gastro-esophageal reflux disease without esophagitis: Secondary | ICD-10-CM

## 2023-10-22 DIAGNOSIS — E782 Mixed hyperlipidemia: Secondary | ICD-10-CM | POA: Diagnosis not present

## 2023-10-22 LAB — CBC WITH DIFFERENTIAL/PLATELET
Basophils Absolute: 0.1 K/uL (ref 0.0–0.1)
Basophils Relative: 1.3 % (ref 0.0–3.0)
Eosinophils Absolute: 0.2 K/uL (ref 0.0–0.7)
Eosinophils Relative: 3.8 % (ref 0.0–5.0)
HCT: 43.3 % (ref 36.0–46.0)
Hemoglobin: 14.6 g/dL (ref 12.0–15.0)
Lymphocytes Relative: 38.9 % (ref 12.0–46.0)
Lymphs Abs: 2.1 K/uL (ref 0.7–4.0)
MCHC: 33.6 g/dL (ref 30.0–36.0)
MCV: 89.4 fl (ref 78.0–100.0)
Monocytes Absolute: 0.4 K/uL (ref 0.1–1.0)
Monocytes Relative: 7.2 % (ref 3.0–12.0)
Neutro Abs: 2.6 K/uL (ref 1.4–7.7)
Neutrophils Relative %: 48.8 % (ref 43.0–77.0)
Platelets: 262 K/uL (ref 150.0–400.0)
RBC: 4.84 Mil/uL (ref 3.87–5.11)
RDW: 14.1 % (ref 11.5–15.5)
WBC: 5.4 K/uL (ref 4.0–10.5)

## 2023-10-22 LAB — LIPID PANEL
Cholesterol: 177 mg/dL (ref 0–200)
HDL: 52.9 mg/dL (ref >39.00)
LDL Cholesterol: 100 mg/dL — ABNORMAL HIGH (ref 0–99)
NonHDL: 124.57
Total CHOL/HDL Ratio: 3
Triglycerides: 121 mg/dL (ref 0.0–149.0)
VLDL: 24.2 mg/dL (ref 0.0–40.0)

## 2023-10-22 LAB — COMPREHENSIVE METABOLIC PANEL WITH GFR
ALT: 10 U/L (ref 0–35)
AST: 14 U/L (ref 0–37)
Albumin: 4.2 g/dL (ref 3.5–5.2)
Alkaline Phosphatase: 65 U/L (ref 39–117)
BUN: 18 mg/dL (ref 6–23)
CO2: 25 meq/L (ref 19–32)
Calcium: 9.5 mg/dL (ref 8.4–10.5)
Chloride: 110 meq/L (ref 96–112)
Creatinine, Ser: 0.83 mg/dL (ref 0.40–1.20)
GFR: 73.3 mL/min (ref >60.00)
Glucose, Bld: 100 mg/dL — ABNORMAL HIGH (ref 70–99)
Potassium: 3.9 meq/L (ref 3.5–5.1)
Sodium: 142 meq/L (ref 135–145)
Total Bilirubin: 0.5 mg/dL (ref 0.2–1.2)
Total Protein: 7 g/dL (ref 6.0–8.3)

## 2023-10-22 LAB — TSH: TSH: 1.79 u[IU]/mL (ref 0.35–5.50)

## 2023-10-27 ENCOUNTER — Ambulatory Visit: Payer: PRIVATE HEALTH INSURANCE | Admitting: Family Medicine

## 2023-10-27 ENCOUNTER — Ambulatory Visit: Payer: Self-pay | Admitting: Family Medicine

## 2023-10-27 NOTE — Progress Notes (Signed)
 See mychart note

## 2023-11-02 ENCOUNTER — Encounter: Payer: Self-pay | Admitting: Gastroenterology

## 2023-11-17 ENCOUNTER — Other Ambulatory Visit: Payer: Self-pay | Admitting: Family Medicine

## 2023-11-17 DIAGNOSIS — R058 Other specified cough: Secondary | ICD-10-CM

## 2023-11-18 ENCOUNTER — Telehealth: Payer: Self-pay

## 2023-11-18 NOTE — Telephone Encounter (Signed)
 RN attempted calling patient for per-visit appointment but received no answer. RN left vm asking patient to call back prior to 4:30 today to complete this appt in order to move forward with scheduled procedure, but patient did not call back.   RN printed and mailed letter to patient informing her of the cancellation of her procedure.

## 2023-12-02 ENCOUNTER — Encounter: Payer: PRIVATE HEALTH INSURANCE | Admitting: Gastroenterology

## 2023-12-14 ENCOUNTER — Other Ambulatory Visit: Payer: Self-pay | Admitting: Family Medicine

## 2024-01-19 ENCOUNTER — Encounter: Payer: Self-pay | Admitting: Family Medicine

## 2024-03-08 ENCOUNTER — Telehealth: Payer: Self-pay | Admitting: Family Medicine

## 2024-03-08 NOTE — Telephone Encounter (Signed)
 Pt's spouse requesting to [facility] from [place] due to limited availability. Please advise

## 2024-07-06 ENCOUNTER — Encounter: Payer: PRIVATE HEALTH INSURANCE | Admitting: Family Medicine
